# Patient Record
Sex: Male | Born: 1975
Health system: Southern US, Community
[De-identification: ages and names within clinical notes are randomized; demographics above are authoritative.]

## PROBLEM LIST (undated history)

## (undated) ENCOUNTER — Ambulatory Visit: Admission: EM | Payer: BC Managed Care – PPO

## (undated) DIAGNOSIS — G4730 Sleep apnea, unspecified: Secondary | ICD-10-CM

## (undated) DIAGNOSIS — Z87728 Personal history of other specified (corrected) congenital malformations of nervous system and sense organs: Secondary | ICD-10-CM

## (undated) DIAGNOSIS — N529 Male erectile dysfunction, unspecified: Secondary | ICD-10-CM

## (undated) DIAGNOSIS — Z8709 Personal history of other diseases of the respiratory system: Secondary | ICD-10-CM

## (undated) DIAGNOSIS — G4733 Obstructive sleep apnea (adult) (pediatric): Secondary | ICD-10-CM

## (undated) DIAGNOSIS — K219 Gastro-esophageal reflux disease without esophagitis: Secondary | ICD-10-CM

## (undated) DIAGNOSIS — M1711 Unilateral primary osteoarthritis, right knee: Secondary | ICD-10-CM

## (undated) DIAGNOSIS — M419 Scoliosis, unspecified: Secondary | ICD-10-CM

## (undated) DIAGNOSIS — Z9889 Other specified postprocedural states: Secondary | ICD-10-CM

## (undated) HISTORY — DX: Personal history of other diseases of the respiratory system: Z87.09

## (undated) HISTORY — DX: Obstructive sleep apnea (adult) (pediatric): G47.33

## (undated) HISTORY — DX: Sleep apnea, unspecified: G47.30

## (undated) HISTORY — PX: OTHER SURGICAL HISTORY: SHX169

## (undated) HISTORY — DX: Male erectile dysfunction, unspecified: N52.9

## (undated) HISTORY — DX: Other specified postprocedural states: Z98.890

## (undated) HISTORY — DX: Unilateral primary osteoarthritis, right knee: M17.11

## (undated) HISTORY — DX: Gastro-esophageal reflux disease without esophagitis: K21.9

---

## 2005-08-02 DIAGNOSIS — G4733 Obstructive sleep apnea (adult) (pediatric): Secondary | ICD-10-CM

## 2005-08-02 HISTORY — DX: Obstructive sleep apnea (adult) (pediatric): G47.33

## 2006-05-04 ENCOUNTER — Ambulatory Visit: Payer: Self-pay | Admitting: Family Medicine

## 2006-05-11 ENCOUNTER — Ambulatory Visit: Payer: Self-pay | Admitting: Family Medicine

## 2006-06-01 ENCOUNTER — Ambulatory Visit: Payer: Self-pay | Admitting: Pulmonary Disease

## 2006-06-29 ENCOUNTER — Ambulatory Visit (HOSPITAL_BASED_OUTPATIENT_CLINIC_OR_DEPARTMENT_OTHER): Admission: RE | Admit: 2006-06-29 | Discharge: 2006-06-29 | Payer: Self-pay | Admitting: Pulmonary Disease

## 2006-06-29 ENCOUNTER — Encounter: Payer: Self-pay | Admitting: Pulmonary Disease

## 2006-07-21 ENCOUNTER — Ambulatory Visit: Payer: Self-pay | Admitting: Pulmonary Disease

## 2006-08-15 ENCOUNTER — Ambulatory Visit: Payer: Self-pay | Admitting: Pulmonary Disease

## 2006-09-21 ENCOUNTER — Ambulatory Visit: Payer: Self-pay | Admitting: Pulmonary Disease

## 2007-03-01 ENCOUNTER — Ambulatory Visit: Payer: Self-pay | Admitting: Pulmonary Disease

## 2007-04-06 ENCOUNTER — Ambulatory Visit: Payer: Self-pay | Admitting: Pulmonary Disease

## 2007-08-25 ENCOUNTER — Ambulatory Visit: Payer: Self-pay | Admitting: Family Medicine

## 2007-08-25 DIAGNOSIS — F528 Other sexual dysfunction not due to a substance or known physiological condition: Secondary | ICD-10-CM

## 2007-08-25 DIAGNOSIS — N529 Male erectile dysfunction, unspecified: Secondary | ICD-10-CM | POA: Insufficient documentation

## 2008-05-22 ENCOUNTER — Ambulatory Visit: Payer: Self-pay | Admitting: Family Medicine

## 2008-05-27 ENCOUNTER — Ambulatory Visit: Payer: Self-pay | Admitting: Family Medicine

## 2008-05-27 LAB — CONVERTED CEMR LAB
Alkaline Phosphatase: 85 units/L (ref 39–117)
Basophils Absolute: 0 10*3/uL (ref 0.0–0.1)
Bilirubin, Direct: 0.1 mg/dL (ref 0.0–0.3)
Calcium: 9.4 mg/dL (ref 8.4–10.5)
Cholesterol: 176 mg/dL (ref 0–200)
Eosinophils Absolute: 0.2 10*3/uL (ref 0.0–0.7)
GFR calc Af Amer: 100 mL/min
GFR calc non Af Amer: 82 mL/min
HCT: 41.7 % (ref 39.0–52.0)
HDL: 32.8 mg/dL — ABNORMAL LOW (ref >39.0)
Hemoglobin: 14.6 g/dL (ref 13.0–17.0)
MCHC: 35.1 g/dL (ref 30.0–36.0)
MCV: 90.2 fL (ref 78.0–100.0)
Monocytes Absolute: 0.5 10*3/uL (ref 0.1–1.0)
Monocytes Relative: 8 % (ref 3.0–12.0)
Neutro Abs: 3.6 10*3/uL (ref 1.4–7.7)
Platelets: 249 10*3/uL (ref 150–400)
Potassium: 3.9 meq/L (ref 3.5–5.1)
RDW: 12.5 % (ref 11.5–14.6)
Sodium: 142 meq/L (ref 135–145)
Total Bilirubin: 0.8 mg/dL (ref 0.3–1.2)
Total CHOL/HDL Ratio: 5.4
Total Protein: 7.5 g/dL (ref 6.0–8.3)
Triglycerides: 85 mg/dL (ref 0–149)

## 2009-12-19 ENCOUNTER — Ambulatory Visit: Payer: Self-pay | Admitting: Family Medicine

## 2009-12-23 ENCOUNTER — Ambulatory Visit: Payer: Self-pay | Admitting: Family Medicine

## 2009-12-23 LAB — CONVERTED CEMR LAB
ALT: 23 units/L (ref 0–53)
AST: 19 units/L (ref 0–37)
Albumin: 4.2 g/dL (ref 3.5–5.2)
BUN: 17 mg/dL (ref 6–23)
Basophils Absolute: 0 10*3/uL (ref 0.0–0.1)
Chloride: 107 meq/L (ref 96–112)
Cholesterol: 168 mg/dL (ref 0–200)
Eosinophils Relative: 2.4 % (ref 0.0–5.0)
GFR calc non Af Amer: 86.82 mL/min (ref >60)
Glucose, Bld: 83 mg/dL (ref 70–99)
HCT: 40.7 % (ref 39.0–52.0)
Hemoglobin: 14.1 g/dL (ref 13.0–17.0)
Lymphs Abs: 1.8 10*3/uL (ref 0.7–4.0)
MCV: 89.5 fL (ref 78.0–100.0)
Monocytes Absolute: 0.5 10*3/uL (ref 0.1–1.0)
Monocytes Relative: 7.1 % (ref 3.0–12.0)
Neutro Abs: 4.1 10*3/uL (ref 1.4–7.7)
Nitrite: NEGATIVE
Platelets: 278 10*3/uL (ref 150.0–400.0)
Potassium: 4.1 meq/L (ref 3.5–5.1)
RDW: 13.3 % (ref 11.5–14.6)
Sodium: 140 meq/L (ref 135–145)
TSH: 0.87 microintl units/mL (ref 0.35–5.50)
Total Protein, Urine: NEGATIVE mg/dL
Urine Glucose: NEGATIVE mg/dL
pH: 7 (ref 5.0–8.0)

## 2010-04-16 ENCOUNTER — Ambulatory Visit: Payer: Self-pay | Admitting: Pulmonary Disease

## 2010-04-16 DIAGNOSIS — G4733 Obstructive sleep apnea (adult) (pediatric): Secondary | ICD-10-CM | POA: Insufficient documentation
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
## 2010-12-15 NOTE — Assessment & Plan Note (Signed)
 HEALTHCARE                             PULMONARY OFFICE NOTE   Travis Duran, Travis Duran                         MRN:          [id number]  DATE:04/06/2007                            DOB:          [birth year]    SUBJECTIVE:  [name] comes in today for followup of his sleep apnea,  which is being treated with CPAP.  He continues to have great difficulty  with keeping the mask on his face.  We have tried all different kinds of  mask, different ways of delivering the pressure via different machines,  as well as medications for desensitization, and also coping mechanisms.  Despite doing this, he states that he is completely unable to wear the  mask and would like to DC the CPAP.   EXAM:  GENERAL:  He is an overweight male in no acute distress.  Blood pressure 116/78, pulse 80, temperature 98.2, weighs 210 pounds, O2  saturation on room air is 97%.  There is no evidence of skin breakdown or pressure necrosis from the  CPAP mask.   IMPRESSION:  Moderate obstructive sleep apnea with poor compliance with  CPAP secondary to mask issues.  We have really tried everything possible  to try and get him adapted to the CPAP, and he truly feels that this is  not something that he is going to be able to use.  I have discussed with  him the possibility of nasoseptal reconstruction and UP3 as well as the  possibility of an oral appliance.  I have also re-explained to him that  weight loss would give him significant improvement.  At this point in  time, the patient wishes to try weight loss alone and would like to hold  off on any other intervention.   PLAN:  1. DC CPAP.  2. The patient will take the next 6 months to try and lose weight, but      will call me if he feels that he will not be able to accomplish      this on his own and we can try either upper airway surgery or      possible oral appliance.     Barbaraann Share, MD,FCCP  Electronically Signed    [facility]  DD: 04/06/2007  DT: 04/06/2007  Job #: [id number]   cc:   Tera Mater. Clent Ridges, MD
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
## 2010-12-18 NOTE — Procedures (Signed)
NAME:  Travis Duran, Travis Duran NO.:  [id number]   MEDICAL RECORD NO.:  [id number]          PATIENT TYPE:  OUT   LOCATION:  SLEEP CENTER                 FACILITY:  [facility]   PHYSICIAN:  Barbaraann Share, MD,FCCPDATE OF BIRTH:  12/31/75   DATE OF STUDY:  06/29/2006                            NOCTURNAL POLYSOMNOGRAM   INDICATION FOR STUDY:  Hypersomnia with sleep apnea.   EPWORTH SLEEPINESS SCORE:  Eleven.   SLEEP ARCHITECTURE:  The patient had a total sleep time of 353 minutes  with decreased __________ and never achieved slow wave sleep.  Sleep  onset latency was normal.  And REM onset was quite prolonged at 181  minutes.  Sleep efficiency was decreased at 87%.   RESPIRATORY DATA:  The patient was found to have 102 hypopneas, 82  obstructive apneas, and 2 central apneas for a respiratory disturbance  index of 32 events per hour.  The events occurred in all body positions  and there was moderate to loud snoring throughout.   OXYGEN DATA:  There was 02 desaturation as low as 85% with the patient's  obstructive events.   CARDIAC DATA:  The patient did exhibit cardiac accelerations and  decelerations with his obstructive events.  Otherwise there was no  clinically significant cardiac arrhythmias.   MOVEMENT-PARASOMNIA:  Small numbers of leg jerks without clinical sleep  disruption.   IMPRESSIONS-RECOMMENDATIONS:  1. Moderate obstructive sleep apnea/hypopnea syndrome with a      respiratory disturbance index of 32 events per hour and oxygen      desaturation as low as 85%.  Treatment for this degree of sleep      apnea should focus on weight loss as well as CPAP, although upper      airway surgery could be considered if the anatomy was appropriate.  2. Cardiac accelerations and decelerations associated with the      patient's obstructive events.      Barbaraann Share, MD,FCCP  Diplomate, American Board of Sleep  Medicine     [facility]  D:  07/15/2006 16:16:20  T:   07/15/2006 23:09:17  Job:  [id number]

## 2010-12-18 NOTE — Assessment & Plan Note (Signed)
[facility]                               PULMONARY OFFICE NOTE   AZEEZ, DUNKER                         MRN:          [id number]  DATE:06/01/2006                            DOB:          01/18/1976    HISTORY OF PRESENT ILLNESS:  The patient is a very pleasant 35 year old  white male whom I have been asked to see for possible sleep apnea.  The  patient states that he has been told by his wife that he has loud snoring as  well as pauses in his breathing during sleep.  The patient typically goes to  bed between 9:00 and 10:00 and gets up at 5:30 to start his day.  He is not  rested upon arousal.  The patient states that he wakes up at least 2-3 times  a night for unknown reasons and denies any choking or snoring arousals.  It  usually takes him 10-15 minutes to get back to sleep.  His wife has not  complained about kicking.  During the day the patient states that he has  significant sleep pressure with periods of inactivity and he will often have  to get up from his desk and walk around in order to regain his alertness.  He has noticed decreased deficiency in concentration.  He has no difficulty  watching movies or TV at night but does have some sleep pressure with  driving long distances.  Of note, the patient's weight is up 5-10 pounds  over the last few years.   PAST MEDICAL HISTORY:  Totally unremarkable except for knee surgery in the  past.   MEDICATIONS:  The patient takes no medications.   ALLERGIES:  No known drug allergies.   SOCIAL HISTORY:  He is married, does not have children.  He does not smoke.   FAMILY HISTORY:  Remarkable for his father having emphysema as well as  hypertension.   REVIEW OF SYSTEMS:  As per history of present illness.  Also, see patient  intake form documented in the chart.   PHYSICAL EXAM:  IN GENERAL:  He is an overweight white male in no acute  distress.  VITAL SIGNS:  Blood pressure 128/86.  Pulse  55.  Temperature 98.5.  Weight  201 pounds.  Height 5 feet 7 inches tall.  O2 saturation on room air is 98%.  HEENT:  Pupils equally round and reactive to light and accommodation.  Extraocular muscles are intact.  Nose shows mild septal deviation to the  left with turbinate hypertrophy.  Oropharynx does show significant  elongation of the soft palate and uvula with some side wall narrowing.  NECK:  Supple without JVD or lymphadenopathy.  There is n palpable  thyromegaly.  CHEST:  Totally clear.  CARDIAC:  Exam reveals regular rate and rhythm.  No murmurs, rubs, or  gallops.  ABDOMEN:  Soft, nontender.  Good bowel sounds.  GENITAL EXAM/RECTAL EXAM/BREAST EXAM:  Not done and not indicated.  LOWER EXTREMITIES:  Without edema.  Good pulses distally.  There is no calf  tenderness.  NEUROLOGICALLY:  Alert  and oriented with no obvious deficits.   IMPRESSION:  Probable obstructive sleep apnea.  The patient is overweight,  has abnormal upper airway anatomy, and has a history that is very suspicious  for obstructive sleep apnea.  I had a long conversation with him about this  and the cardiovascular effects long term.  At this point in time I think he  needs to have nocturnal polysomnography.   PLAN:  1. Work on weight loss.  2. Scheduled for MPSG.  3. The patient will follow up after the above.    ______________________________  Barbaraann Share, MD,FCCP    [facility]  DD: 06/01/2006  DT: 06/01/2006  Job #: [id number]   cc:   Jeannett Senior A. Clent Ridges, MD

## 2012-09-02 HISTORY — PX: REFRACTIVE SURGERY: SHX103

## 2012-10-09 ENCOUNTER — Encounter: Payer: Self-pay | Admitting: Family Medicine

## 2012-10-09 ENCOUNTER — Ambulatory Visit (INDEPENDENT_AMBULATORY_CARE_PROVIDER_SITE_OTHER): Payer: BC Managed Care – PPO | Admitting: Family Medicine

## 2012-10-09 VITALS — BP 104/78 | HR 77 | Temp 98.7°F | Wt 199.0 lb

## 2012-10-09 DIAGNOSIS — J209 Acute bronchitis, unspecified: Secondary | ICD-10-CM

## 2012-10-09 MED ORDER — AZITHROMYCIN 250 MG PO TABS: ORAL_TABLET | ORAL | Status: DC

## 2012-10-09 MED ORDER — HYDROCODONE-HOMATROPINE 5-1.5 MG/5ML PO SYRP: 5.0000 mL | ORAL_SOLUTION | ORAL | Status: DC | PRN

## 2012-10-09 NOTE — Progress Notes (Signed)
  Subjective:    Patient ID: Travis Duran, male    DOB: [birth year], 37 y.o.   MRN: [id number]  HPI Here for 5 days of a dry cough and a ST. Some body aches at first but not now. No fever. Tried Delsym with no relief.   Review of Systems  Constitutional: Negative.   HENT: Negative.   Eyes: Negative.   Respiratory: Positive for cough.        Objective:   Physical Exam  Constitutional: He appears well-developed and well-nourished.  HENT:  Right Ear: External ear normal.  Left Ear: External ear normal.  Nose: Nose normal.  Mouth/Throat: Oropharynx is clear and moist.  Eyes: Conjunctivae are normal.  Pulmonary/Chest: Effort normal and breath sounds normal.  Lymphadenopathy:    He has no cervical adenopathy.          Assessment & Plan:  Recheck prn

## 2013-10-29 ENCOUNTER — Ambulatory Visit (INDEPENDENT_AMBULATORY_CARE_PROVIDER_SITE_OTHER): Payer: BC Managed Care – PPO | Admitting: Family Medicine

## 2013-10-29 ENCOUNTER — Encounter: Payer: Self-pay | Admitting: Family Medicine

## 2013-10-29 VITALS — BP 110/80 | HR 85 | Temp 99.1°F | Ht 69.0 in | Wt 199.0 lb

## 2013-10-29 DIAGNOSIS — B9789 Other viral agents as the cause of diseases classified elsewhere: Secondary | ICD-10-CM

## 2013-10-29 DIAGNOSIS — B349 Viral infection, unspecified: Secondary | ICD-10-CM

## 2013-10-29 LAB — POCT RAPID STREP A (OFFICE): Rapid Strep A Screen: NEGATIVE

## 2013-10-29 NOTE — Progress Notes (Signed)
   Subjective:    Patient ID: Donnella Sham, male    DOB: 03-25-1976, 38 y.o.   MRN: [id number]  HPI Here for 3 days of fever, swollen neck nodes, and extreme fatigue. Some dry coughing but not much. No ST. Drinking fluids and taking Advil.    Review of Systems  Constitutional: Positive for fever and fatigue.  HENT: Negative.   Eyes: Negative.   Respiratory: Positive for cough. Negative for chest tightness, shortness of breath and wheezing.   Gastrointestinal: Negative.        Objective:   Physical Exam  Constitutional: He appears well-developed and well-nourished. No distress.  HENT:  Right Ear: External ear normal.  Left Ear: External ear normal.  Nose: Nose normal.  Mouth/Throat: Oropharynx is clear and moist.  Eyes: Conjunctivae are normal.  Neck: Neck supple. No thyromegaly present.  Shotty tender AC nodes   Pulmonary/Chest: Effort normal and breath sounds normal.  Abdominal: Soft. Bowel sounds are normal. He exhibits no distension and no mass. There is no tenderness. There is no rebound and no guarding.          Assessment & Plan:  He has a viral illness of some sort. We will get labs to look for mononucleosis. Rest, drink fluids, use Advil prn .

## 2013-10-29 NOTE — Progress Notes (Signed)
Pre visit review using our clinic review tool, if applicable. No additional management support is needed unless otherwise documented below in the visit note. 

## 2013-10-30 LAB — EPSTEIN-BARR VIRUS VCA, IGM: EBV VCA IgM: 26.7 U/mL (ref <36.0)

## 2013-10-30 LAB — EPSTEIN-BARR VIRUS VCA, IGG: EBV VCA IgG: 197 U/mL — ABNORMAL HIGH (ref <18.0)

## 2014-04-29 ENCOUNTER — Ambulatory Visit (INDEPENDENT_AMBULATORY_CARE_PROVIDER_SITE_OTHER): Payer: BC Managed Care – PPO | Admitting: Family Medicine

## 2014-04-29 ENCOUNTER — Encounter: Payer: Self-pay | Admitting: Family Medicine

## 2014-04-29 VITALS — BP 113/69 | HR 77 | Temp 100.3°F | Ht 69.0 in | Wt 199.0 lb

## 2014-04-29 DIAGNOSIS — J209 Acute bronchitis, unspecified: Secondary | ICD-10-CM

## 2014-04-29 MED ORDER — HYDROCODONE-HOMATROPINE 5-1.5 MG/5ML PO SYRP: 5.0000 mL | ORAL_SOLUTION | ORAL | Status: DC | PRN

## 2014-04-29 MED ORDER — AZITHROMYCIN 250 MG PO TABS: ORAL_TABLET | ORAL | Status: DC

## 2014-04-29 NOTE — Progress Notes (Signed)
Pre visit review using our clinic review tool, if applicable. No additional management support is needed unless otherwise documented below in the visit note. 

## 2014-04-29 NOTE — Progress Notes (Signed)
   Subjective:    Patient ID: Travis Duran, male    DOB: [birth year], 38 y.o.   MRN: [id number]  HPI Here for 5 days of fevers, ST, and a dry cough. Using Nyquil.    Review of Systems  Constitutional: Positive for fever.  HENT: Positive for congestion and postnasal drip. Negative for sinus pressure.   Respiratory: Positive for cough.        Objective:   Physical Exam  Constitutional: He appears well-developed and well-nourished.  HENT:  Right Ear: External ear normal.  Left Ear: External ear normal.  Nose: Nose normal.  Mouth/Throat: Oropharynx is clear and moist.  Eyes: Conjunctivae are normal.  Pulmonary/Chest: Effort normal. No respiratory distress. He has no wheezes. He has no rales.  Scattered rhonchi   Lymphadenopathy:    He has no cervical adenopathy.          Assessment & Plan:  Written out of work today and tomorrow

## 2015-12-05 ENCOUNTER — Other Ambulatory Visit (INDEPENDENT_AMBULATORY_CARE_PROVIDER_SITE_OTHER): Payer: BC Managed Care – PPO

## 2015-12-05 DIAGNOSIS — Z0000 Encounter for general adult medical examination without abnormal findings: Secondary | ICD-10-CM

## 2015-12-05 LAB — BASIC METABOLIC PANEL
BUN: 16 mg/dL (ref 6–23)
CALCIUM: 9.8 mg/dL (ref 8.4–10.5)
CO2: 28 mEq/L (ref 19–32)
Chloride: 104 mEq/L (ref 96–112)
Creatinine, Ser: 1.16 mg/dL (ref 0.40–1.50)
GFR: 74.08 mL/min (ref >60.00)
GLUCOSE: 97 mg/dL (ref 70–99)
Potassium: 3.9 mEq/L (ref 3.5–5.1)
Sodium: 139 mEq/L (ref 135–145)

## 2015-12-05 LAB — HEPATIC FUNCTION PANEL
ALK PHOS: 84 U/L (ref 39–117)
ALT: 17 U/L (ref 0–53)
AST: 15 U/L (ref 0–37)
Albumin: 4.6 g/dL (ref 3.5–5.2)
Bilirubin, Direct: 0.1 mg/dL (ref 0.0–0.3)
Total Bilirubin: 0.5 mg/dL (ref 0.2–1.2)
Total Protein: 7.4 g/dL (ref 6.0–8.3)

## 2015-12-05 LAB — POC URINALSYSI DIPSTICK (AUTOMATED)
Bilirubin, UA: NEGATIVE
GLUCOSE UA: NEGATIVE
KETONES UA: NEGATIVE
Leukocytes, UA: NEGATIVE
NITRITE UA: NEGATIVE
PROTEIN UA: NEGATIVE
SPEC GRAV UA: 1.025
UROBILINOGEN UA: 0.2
pH, UA: 6

## 2015-12-05 LAB — CBC WITH DIFFERENTIAL/PLATELET
BASOS ABS: 0 10*3/uL (ref 0.0–0.1)
Basophils Relative: 0.4 % (ref 0.0–3.0)
EOS ABS: 0.2 10*3/uL (ref 0.0–0.7)
Eosinophils Relative: 2.9 % (ref 0.0–5.0)
HCT: 42.6 % (ref 39.0–52.0)
Hemoglobin: 14.5 g/dL (ref 13.0–17.0)
LYMPHS ABS: 2.3 10*3/uL (ref 0.7–4.0)
Lymphocytes Relative: 32.6 % (ref 12.0–46.0)
MCHC: 34.1 g/dL (ref 30.0–36.0)
MCV: 88.4 fl (ref 78.0–100.0)
Monocytes Absolute: 0.5 10*3/uL (ref 0.1–1.0)
Monocytes Relative: 6.8 % (ref 3.0–12.0)
NEUTROS PCT: 57.3 % (ref 43.0–77.0)
Neutro Abs: 4 10*3/uL (ref 1.4–7.7)
Platelets: 253 10*3/uL (ref 150.0–400.0)
RBC: 4.82 Mil/uL (ref 4.22–5.81)
RDW: 13.1 % (ref 11.5–15.5)
WBC: 6.9 10*3/uL (ref 4.0–10.5)

## 2015-12-05 LAB — LIPID PANEL
CHOLESTEROL: 170 mg/dL (ref 0–200)
HDL: 45.5 mg/dL (ref >39.00)
LDL CALC: 111 mg/dL — AB (ref 0–99)
NONHDL: 124.68
Total CHOL/HDL Ratio: 4
Triglycerides: 67 mg/dL (ref 0.0–149.0)
VLDL: 13.4 mg/dL (ref 0.0–40.0)

## 2015-12-05 LAB — TSH: TSH: 1.86 u[IU]/mL (ref 0.35–4.50)

## 2015-12-10 ENCOUNTER — Other Ambulatory Visit: Payer: BC Managed Care – PPO

## 2015-12-16 ENCOUNTER — Encounter: Payer: Self-pay | Admitting: Family Medicine

## 2015-12-16 ENCOUNTER — Ambulatory Visit (INDEPENDENT_AMBULATORY_CARE_PROVIDER_SITE_OTHER): Payer: BC Managed Care – PPO | Admitting: Family Medicine

## 2015-12-16 VITALS — BP 105/73 | HR 64 | Temp 98.7°F | Ht 69.0 in | Wt 208.0 lb

## 2015-12-16 DIAGNOSIS — Z23 Encounter for immunization: Secondary | ICD-10-CM | POA: Diagnosis not present

## 2015-12-16 DIAGNOSIS — Z0000 Encounter for general adult medical examination without abnormal findings: Secondary | ICD-10-CM

## 2015-12-16 MED ORDER — TADALAFIL 10 MG PO TABS: 10.0000 mg | ORAL_TABLET | Freq: Every day | ORAL | Status: DC | PRN

## 2015-12-16 NOTE — Progress Notes (Signed)
   Subjective:    Patient ID: Travis Duran, male    DOB: 1976-02-13, 40 y.o.   MRN: [id number]  HPI 40 yr old male for a well exam. He feels fine. He is working and going to graduate school.    Review of Systems  Constitutional: Negative.   HENT: Negative.   Eyes: Negative.   Respiratory: Negative.   Cardiovascular: Negative.   Gastrointestinal: Negative.   Genitourinary: Negative.   Musculoskeletal: Negative.   Skin: Negative.   Neurological: Negative.   Psychiatric/Behavioral: Negative.        Objective:   Physical Exam  Constitutional: He is oriented to person, place, and time. He appears well-developed and well-nourished. No distress.  HENT:  Head: Normocephalic and atraumatic.  Right Ear: External ear normal.  Left Ear: External ear normal.  Nose: Nose normal.  Mouth/Throat: Oropharynx is clear and moist. No oropharyngeal exudate.  Eyes: Conjunctivae and EOM are normal. Pupils are equal, round, and reactive to light. Right eye exhibits no discharge. Left eye exhibits no discharge. No scleral icterus.  Neck: Neck supple. No JVD present. No tracheal deviation present. No thyromegaly present.  Cardiovascular: Normal rate, regular rhythm, normal heart sounds and intact distal pulses.  Exam reveals no gallop and no friction rub.   No murmur heard. Pulmonary/Chest: Effort normal and breath sounds normal. No respiratory distress. He has no wheezes. He has no rales. He exhibits no tenderness.  Abdominal: Soft. Bowel sounds are normal. He exhibits no distension and no mass. There is no tenderness. There is no rebound and no guarding.  Genitourinary: Rectum normal, prostate normal and penis normal. Guaiac negative stool. No penile tenderness.  Musculoskeletal: Normal range of motion. He exhibits no edema or tenderness.  Lymphadenopathy:    He has no cervical adenopathy.  Neurological: He is alert and oriented to person, place, and time. He has normal reflexes. No cranial nerve  deficit. He exhibits normal muscle tone. Coordination normal.  Skin: Skin is warm and dry. No rash noted. He is not diaphoretic. No erythema. No pallor.  Psychiatric: He has a normal mood and affect. His behavior is normal. Judgment and thought content normal.          Assessment & Plan:  Well exam. We discussed diet and exercise.  Laurey Morale, MD

## 2015-12-16 NOTE — Progress Notes (Signed)
Pre visit review using our clinic review tool, if applicable. No additional management support is needed unless otherwise documented below in the visit note. 

## 2019-03-21 ENCOUNTER — Encounter: Payer: Self-pay | Admitting: Family Medicine

## 2019-04-06 ENCOUNTER — Encounter: Payer: Self-pay | Admitting: Family Medicine

## 2019-04-06 ENCOUNTER — Other Ambulatory Visit: Payer: Self-pay

## 2019-04-06 ENCOUNTER — Other Ambulatory Visit: Payer: Self-pay | Admitting: Family Medicine

## 2019-04-06 ENCOUNTER — Ambulatory Visit (INDEPENDENT_AMBULATORY_CARE_PROVIDER_SITE_OTHER): Payer: BC Managed Care – PPO | Admitting: Family Medicine

## 2019-04-06 VITALS — BP 130/70 | HR 73 | Temp 99.8°F | Wt 205.4 lb

## 2019-04-06 DIAGNOSIS — Z0000 Encounter for general adult medical examination without abnormal findings: Secondary | ICD-10-CM

## 2019-04-06 DIAGNOSIS — Z309 Encounter for contraceptive management, unspecified: Secondary | ICD-10-CM | POA: Diagnosis not present

## 2019-04-06 LAB — HEPATIC FUNCTION PANEL
ALT: 16 U/L (ref 0–53)
AST: 13 U/L (ref 0–37)
Albumin: 4.5 g/dL (ref 3.5–5.2)
Alkaline Phosphatase: 96 U/L (ref 39–117)
Bilirubin, Direct: 0.1 mg/dL (ref 0.0–0.3)
Total Bilirubin: 0.6 mg/dL (ref 0.2–1.2)
Total Protein: 7.3 g/dL (ref 6.0–8.3)

## 2019-04-06 LAB — BASIC METABOLIC PANEL
BUN: 14 mg/dL (ref 6–23)
CO2: 31 mEq/L (ref 19–32)
Calcium: 9.8 mg/dL (ref 8.4–10.5)
Chloride: 101 mEq/L (ref 96–112)
Creatinine, Ser: 1.22 mg/dL (ref 0.40–1.50)
GFR: 64.7 mL/min (ref >60.00)
Glucose, Bld: 79 mg/dL (ref 70–99)
Potassium: 4.2 mEq/L (ref 3.5–5.1)
Sodium: 139 mEq/L (ref 135–145)

## 2019-04-06 LAB — LIPID PANEL
Cholesterol: 170 mg/dL (ref 0–200)
HDL: 43.2 mg/dL (ref >39.00)
LDL Cholesterol: 115 mg/dL — ABNORMAL HIGH (ref 0–99)
NonHDL: 126.73
Total CHOL/HDL Ratio: 4
Triglycerides: 58 mg/dL (ref 0.0–149.0)
VLDL: 11.6 mg/dL (ref 0.0–40.0)

## 2019-04-06 LAB — POC URINALSYSI DIPSTICK (AUTOMATED)
Bilirubin, UA: NEGATIVE
Blood, UA: NEGATIVE
Glucose, UA: NEGATIVE
Ketones, UA: NEGATIVE
Leukocytes, UA: NEGATIVE
Nitrite, UA: NEGATIVE
Protein, UA: NEGATIVE
Spec Grav, UA: 1.02 (ref 1.010–1.025)
Urobilinogen, UA: 0.2 E.U./dL
pH, UA: 6 (ref 5.0–8.0)

## 2019-04-06 LAB — CBC WITH DIFFERENTIAL/PLATELET
Basophils Absolute: 0 10*3/uL (ref 0.0–0.1)
Basophils Relative: 0.2 % (ref 0.0–3.0)
Eosinophils Absolute: 0.2 10*3/uL (ref 0.0–0.7)
Eosinophils Relative: 2.5 % (ref 0.0–5.0)
HCT: 43.2 % (ref 39.0–52.0)
Hemoglobin: 14.7 g/dL (ref 13.0–17.0)
Lymphocytes Relative: 33.1 % (ref 12.0–46.0)
Lymphs Abs: 2.2 10*3/uL (ref 0.7–4.0)
MCHC: 34 g/dL (ref 30.0–36.0)
MCV: 90.7 fl (ref 78.0–100.0)
Monocytes Absolute: 0.5 10*3/uL (ref 0.1–1.0)
Monocytes Relative: 7.3 % (ref 3.0–12.0)
Neutro Abs: 3.8 10*3/uL (ref 1.4–7.7)
Neutrophils Relative %: 56.9 % (ref 43.0–77.0)
Platelets: 278 10*3/uL (ref 150.0–400.0)
RBC: 4.76 Mil/uL (ref 4.22–5.81)
RDW: 13.7 % (ref 11.5–15.5)
WBC: 6.7 10*3/uL (ref 4.0–10.5)

## 2019-04-06 LAB — TSH: TSH: 2.17 u[IU]/mL (ref 0.35–4.50)

## 2019-04-06 MED ORDER — TADALAFIL 10 MG PO TABS: 10.0000 mg | ORAL_TABLET | Freq: Every day | ORAL | 11 refills | Status: DC | PRN

## 2019-04-06 NOTE — Progress Notes (Signed)
   Subjective:    Patient ID: Travis Duran, male    DOB: August 30, 1975, 43 y.o.   MRN: [id number]  HPI Here for a well exam. He feels fine. He is interested in getting a vasectomy.      Review of Systems  Constitutional: Negative.   HENT: Negative.   Eyes: Negative.   Respiratory: Negative.   Cardiovascular: Negative.   Gastrointestinal: Negative.   Genitourinary: Negative.   Musculoskeletal: Negative.   Skin: Negative.   Neurological: Negative.   Psychiatric/Behavioral: Negative.        Objective:   Physical Exam Constitutional:      General: He is not in acute distress.    Appearance: He is well-developed. He is not diaphoretic.  HENT:     Head: Normocephalic and atraumatic.     Right Ear: External ear normal.     Left Ear: External ear normal.     Nose: Nose normal.     Mouth/Throat:     Pharynx: No oropharyngeal exudate.  Eyes:     General: No scleral icterus.       Right eye: No discharge.        Left eye: No discharge.     Conjunctiva/sclera: Conjunctivae normal.     Pupils: Pupils are equal, round, and reactive to light.  Neck:     Musculoskeletal: Neck supple.     Thyroid: No thyromegaly.     Vascular: No JVD.     Trachea: No tracheal deviation.  Cardiovascular:     Rate and Rhythm: Normal rate and regular rhythm.     Heart sounds: Normal heart sounds. No murmur. No friction rub. No gallop.   Pulmonary:     Effort: Pulmonary effort is normal. No respiratory distress.     Breath sounds: Normal breath sounds. No wheezing or rales.  Chest:     Chest wall: No tenderness.  Abdominal:     General: Bowel sounds are normal. There is no distension.     Palpations: Abdomen is soft. There is no mass.     Tenderness: There is no abdominal tenderness. There is no guarding or rebound.  Genitourinary:    Penis: Normal. No tenderness.      Scrotum/Testes: Normal.  Musculoskeletal: Normal range of motion.        General: No tenderness.  Lymphadenopathy:   Cervical: No cervical adenopathy.  Skin:    General: Skin is warm and dry.     Coloration: Skin is not pale.     Findings: No erythema or rash.  Neurological:     Mental Status: He is alert and oriented to person, place, and time.     Cranial Nerves: No cranial nerve deficit.     Motor: No abnormal muscle tone.     Coordination: Coordination normal.     Deep Tendon Reflexes: Reflexes are normal and symmetric. Reflexes normal.  Psychiatric:        Behavior: Behavior normal.        Thought Content: Thought content normal.        Judgment: Judgment normal.           Assessment & Plan:  Well exam. We discussed diet and exercise. Get fasting labs. Refer to Urology. Alysia Penna, MD

## 2019-04-10 MED ORDER — SILDENAFIL CITRATE 20 MG PO TABS: ORAL_TABLET | ORAL | 11 refills | Status: DC

## 2019-04-10 NOTE — Telephone Encounter (Signed)
Rx has been sent in. 

## 2019-04-10 NOTE — Telephone Encounter (Signed)
Stop Vardenafil and call in Sildenafil 20 mg to use prn, #10 with 11 rf

## 2020-03-24 ENCOUNTER — Encounter: Payer: Self-pay | Admitting: Family Medicine

## 2020-03-24 ENCOUNTER — Telehealth (INDEPENDENT_AMBULATORY_CARE_PROVIDER_SITE_OTHER): Payer: BC Managed Care – PPO | Admitting: Family Medicine

## 2020-03-24 VITALS — Ht 69.0 in

## 2020-03-24 DIAGNOSIS — R05 Cough: Secondary | ICD-10-CM | POA: Diagnosis not present

## 2020-03-24 NOTE — Progress Notes (Signed)
   Subjective:    Patient ID: Travis Duran, male    DOB: 06-03-1976, 44 y.o.   MRN: [id number]  HPI Here for 2 weeks of a dry cough. No other symptoms, no fever or headache or ST or SOB or body aches or NVD. He has been taking Zyrtec and Nyquil. He has been fully vaccinated against the Covid virus.  Virtual Visit via Telephone Note  I connected with the patient on 03/24/20 at 11:15 AM EDT by telephone and verified that I am speaking with the correct person using two identifiers.   I discussed the limitations, risks, security and privacy concerns of performing an evaluation and management service by telephone and the availability of in person appointments. I also discussed with the patient that there may be a patient responsible charge related to this service. The patient expressed understanding and agreed to proceed.  Location patient: home Location provider: work or home office Participants present for the call: patient, provider Patient did not have a visit in the prior 7 days to address this/these issue(s).   History of Present Illness:    Observations/Objective: Patient sounds cheerful and well on the phone. I do not appreciate any SOB. Speech and thought processing are grossly intact. Patient reported vitals:  Assessment and Plan: Cough, either allergic or viral in nature. Stay on Zyrtec and try Delsym. Recheck prn.  Alysia Penna, MD   Follow Up Instructions:     [phone] [phone] 11-20 [phone] 21-30 I did not refer this patient for an OV in the next 24 hours for this/these issue(s).  I discussed the assessment and treatment plan with the patient. The patient was provided an opportunity to ask questions and all were answered. The patient agreed with the plan and demonstrated an understanding of the instructions.   The patient was advised to call back or seek an in-person evaluation if the symptoms worsen or if the condition fails to improve as anticipated.  I provided 13  minutes of non-face-to-face time during this encounter.   Alysia Penna, MD    Review of Systems     Objective:   Physical Exam        Assessment & Plan:

## 2020-03-31 ENCOUNTER — Other Ambulatory Visit: Payer: Self-pay

## 2020-03-31 ENCOUNTER — Encounter: Payer: Self-pay | Admitting: Family Medicine

## 2020-03-31 ENCOUNTER — Ambulatory Visit (INDEPENDENT_AMBULATORY_CARE_PROVIDER_SITE_OTHER): Payer: BC Managed Care – PPO | Admitting: Family Medicine

## 2020-03-31 VITALS — BP 120/70 | HR 108 | Temp 98.7°F | Wt 211.8 lb

## 2020-03-31 DIAGNOSIS — J90 Pleural effusion, not elsewhere classified: Secondary | ICD-10-CM | POA: Diagnosis not present

## 2020-03-31 DIAGNOSIS — R053 Chronic cough: Secondary | ICD-10-CM

## 2020-03-31 DIAGNOSIS — R05 Cough: Secondary | ICD-10-CM | POA: Diagnosis not present

## 2020-03-31 MED ORDER — HYDROCODONE-HOMATROPINE 5-1.5 MG/5ML PO SYRP: 5.0000 mL | ORAL_SOLUTION | Freq: Three times a day (TID) | ORAL | 0 refills | Status: DC | PRN

## 2020-03-31 MED ORDER — FLOVENT HFA 110 MCG/ACT IN AERO: 2.0000 | INHALATION_SPRAY | Freq: Two times a day (BID) | RESPIRATORY_TRACT | 0 refills | Status: DC

## 2020-03-31 MED ORDER — BENZONATATE 200 MG PO CAPS: 200.0000 mg | ORAL_CAPSULE | Freq: Two times a day (BID) | ORAL | 0 refills | Status: DC | PRN

## 2020-03-31 NOTE — Progress Notes (Signed)
   Subjective:    Patient ID: Travis Duran, male    DOB: 1975-12-01, 44 y.o.   MRN: [id number]  HPI He for a cough that started 3 weeks ago. He has a frequent dry, hacking cough that has not responded to Zyrtec or Delsym. No sinus congestion or PND or ST no chest pain or SOB or wheezing. No fever or body aches. No NVD.    Review of Systems  Constitutional: Negative.   HENT: Negative.   Eyes: Negative.   Respiratory: Positive for cough. Negative for chest tightness, shortness of breath, wheezing and stridor.   Cardiovascular: Negative.        Objective:   Physical Exam Constitutional:      Appearance: Normal appearance.     Comments: He coughs frequently   HENT:     Right Ear: Tympanic membrane, ear canal and external ear normal.     Left Ear: Tympanic membrane, ear canal and external ear normal.     Nose: Nose normal.     Mouth/Throat:     Pharynx: Oropharynx is clear.  Eyes:     Conjunctiva/sclera: Conjunctivae normal.  Cardiovascular:     Rate and Rhythm: Normal rate and regular rhythm.     Pulses: Normal pulses.     Heart sounds: Normal heart sounds.  Pulmonary:     Effort: Pulmonary effort is normal. No respiratory distress.     Breath sounds: Normal breath sounds. No stridor. No wheezing, rhonchi or rales.  Lymphadenopathy:     Cervical: No cervical adenopathy.  Neurological:     Mental Status: He is alert.           Assessment & Plan:  Chronic cough, he will try Flovent BID, Benzonatate BID, and Hydromet as needed. Get a CXR today. Alysia Penna, MD

## 2020-04-01 ENCOUNTER — Telehealth: Payer: Self-pay | Admitting: Family Medicine

## 2020-04-01 MED ORDER — PROMETHAZINE-CODEINE 6.25-10 MG/5ML PO SYRP: 10.0000 mL | ORAL_SOLUTION | Freq: Four times a day (QID) | ORAL | 0 refills | Status: DC | PRN

## 2020-04-01 NOTE — Telephone Encounter (Signed)
Pt stated the cough syrup Hycodan is on back order and wondering if there is an alternative that can be called into his pharmacy? [facility] [place] [place] Phone:  [phone]  Fax:  [phone]      Pt is also wondering if it is possible for him to schedule an appt at the [facility] location for his chest xray due to him living far away and not wanting to wait hours to be seen for it?   Pt can be reached at [phone]

## 2020-04-01 NOTE — Telephone Encounter (Signed)
I called in another cough syrup called promethazine with codeine. As for the Xray, I am sure he could call [facility] at [phone] to schedule this

## 2020-04-03 ENCOUNTER — Ambulatory Visit
Admission: RE | Admit: 2020-04-03 | Discharge: 2020-04-03 | Disposition: A | Discharge status: Home / Self Care | Payer: BC Managed Care – PPO | Source: Ambulatory Visit | Attending: Family Medicine | Admitting: Family Medicine

## 2020-04-03 DIAGNOSIS — R05 Cough: Secondary | ICD-10-CM | POA: Diagnosis present

## 2020-04-03 DIAGNOSIS — R053 Chronic cough: Secondary | ICD-10-CM

## 2020-04-04 ENCOUNTER — Other Ambulatory Visit: Payer: Self-pay

## 2020-04-04 ENCOUNTER — Telehealth: Payer: Self-pay | Admitting: Family Medicine

## 2020-04-04 ENCOUNTER — Other Ambulatory Visit: Payer: Self-pay | Admitting: Family Medicine

## 2020-04-04 MED ORDER — AMOXICILLIN-POT CLAVULANATE 875-125 MG PO TABS: 1.0000 | ORAL_TABLET | Freq: Two times a day (BID) | ORAL | 0 refills | Status: DC

## 2020-04-04 NOTE — Telephone Encounter (Signed)
Patient called in to requested that his antibiotics to be sent to  [facility], [place] Phone:  [phone]  Fax:  [phone]     Instead of the one in [place]

## 2020-04-04 NOTE — Telephone Encounter (Signed)
Pt called to say the pharmacy cannot fill his prescription   amoxicillin-clavulanate (AUGMENTIN) 875-125 MG tablet   Until 09/11    [facility] [address] [facility] [place] [phone]   Pt also is asking for his CT scan to be done at [facility]Marland Kitchen

## 2020-04-04 NOTE — Telephone Encounter (Signed)
Spoke with the patient. He stated [facility] [address] to pick this up. He was also made aware that we are working on switching his referral to [facility]

## 2020-04-04 NOTE — Addendum Note (Signed)
Addended by: Alysia Penna A on: 04/04/2020 09:00 AM   Modules accepted: Orders

## 2020-04-04 NOTE — Telephone Encounter (Signed)
Rx has been sent to the requested pharmacy. The patient has been made aware. Nothing further.

## 2020-04-04 NOTE — Telephone Encounter (Signed)
Hey can you transfer his chest CT order to [facility], or do I have to start all over again?

## 2020-04-04 NOTE — Telephone Encounter (Signed)
[facility] was [facility] [address]. The patient called back and asked that it be sent to another pharmacy. [facility] had already filled the prescription that is why it was "too soon" to be filled.   atc the pharmacy and cancel the prescription at [facility] so that this could be filled. Was on hold for 10 minutes and no one answered the phone. Will continue to try and reach them.  Please advise on the CT

## 2020-04-15 ENCOUNTER — Encounter: Payer: Self-pay | Admitting: Family Medicine

## 2020-04-16 NOTE — Telephone Encounter (Signed)
At this point we should wait and see how he does in the next week or two. He is scheduled for a chest CT on 04-24-20.

## 2020-04-16 NOTE — Telephone Encounter (Signed)
Pt received a call from the pharmacy stating the amoxicillin was refilled and he was not aware he was suppose to be taking any more? He said the refilled date was 9/11    Pt can be reached at [phone]

## 2020-04-17 NOTE — Telephone Encounter (Signed)
Patient called to ask about his call to the office below. He says that he went to pick up the amoxicillin but hasn't taken it. I asked did he complete the course of the medication that was sent on 04/04/20, he says yes he took amoxicillin already. I advised I will call him back after checking with [facility]. I called [facility] and spoke to [place] about the refill of Amoxicillin on 04/12/20. She says the patient had one filled on [facility]/04/20 at another [facility], so the insurance would not allow them to fill until 9/11, so that's what they did and the patient came to pick it up. I called the patient and advised of the mixup and advised not to take the Amoxicillin he picked up yesterday because it's not needed, he verbalized understanding.

## 2020-04-24 ENCOUNTER — Telehealth: Payer: Self-pay | Admitting: *Deleted

## 2020-04-24 ENCOUNTER — Inpatient Hospital Stay (HOSPITAL_COMMUNITY)
Admission: EM | Admit: 2020-04-24 | Discharge: 2020-04-28 | DRG: 821 | Disposition: A | Discharge status: Home / Self Care | Payer: BC Managed Care – PPO | Attending: Internal Medicine | Admitting: Internal Medicine

## 2020-04-24 ENCOUNTER — Other Ambulatory Visit: Payer: Self-pay

## 2020-04-24 ENCOUNTER — Ambulatory Visit
Admission: RE | Admit: 2020-04-24 | Discharge: 2020-04-24 | Disposition: A | Discharge status: Home / Self Care | Payer: BC Managed Care – PPO | Source: Ambulatory Visit | Attending: Family Medicine | Admitting: Family Medicine

## 2020-04-24 DIAGNOSIS — J910 Malignant pleural effusion: Secondary | ICD-10-CM | POA: Diagnosis present

## 2020-04-24 DIAGNOSIS — R630 Anorexia: Secondary | ICD-10-CM | POA: Diagnosis present

## 2020-04-24 DIAGNOSIS — C8520 Mediastinal (thymic) large B-cell lymphoma, unspecified site: Principal | ICD-10-CM | POA: Diagnosis present

## 2020-04-24 DIAGNOSIS — R918 Other nonspecific abnormal finding of lung field: Secondary | ICD-10-CM

## 2020-04-24 DIAGNOSIS — G4700 Insomnia, unspecified: Secondary | ICD-10-CM | POA: Diagnosis present

## 2020-04-24 DIAGNOSIS — Z8249 Family history of ischemic heart disease and other diseases of the circulatory system: Secondary | ICD-10-CM

## 2020-04-24 DIAGNOSIS — Z0189 Encounter for other specified special examinations: Secondary | ICD-10-CM | POA: Diagnosis not present

## 2020-04-24 DIAGNOSIS — Z9889 Other specified postprocedural states: Secondary | ICD-10-CM

## 2020-04-24 DIAGNOSIS — J90 Pleural effusion, not elsewhere classified: Secondary | ICD-10-CM

## 2020-04-24 DIAGNOSIS — J9859 Other diseases of mediastinum, not elsewhere classified: Secondary | ICD-10-CM

## 2020-04-24 DIAGNOSIS — Z825 Family history of asthma and other chronic lower respiratory diseases: Secondary | ICD-10-CM

## 2020-04-24 DIAGNOSIS — Z8709 Personal history of other diseases of the respiratory system: Secondary | ICD-10-CM

## 2020-04-24 DIAGNOSIS — N529 Male erectile dysfunction, unspecified: Secondary | ICD-10-CM | POA: Diagnosis present

## 2020-04-24 DIAGNOSIS — Z6831 Body mass index (BMI) 31.0-31.9, adult: Secondary | ICD-10-CM | POA: Diagnosis not present

## 2020-04-24 DIAGNOSIS — Z23 Encounter for immunization: Secondary | ICD-10-CM | POA: Diagnosis not present

## 2020-04-24 DIAGNOSIS — R0602 Shortness of breath: Secondary | ICD-10-CM

## 2020-04-24 DIAGNOSIS — Z79899 Other long term (current) drug therapy: Secondary | ICD-10-CM

## 2020-04-24 DIAGNOSIS — G4733 Obstructive sleep apnea (adult) (pediatric): Secondary | ICD-10-CM | POA: Diagnosis present

## 2020-04-24 DIAGNOSIS — R053 Chronic cough: Secondary | ICD-10-CM

## 2020-04-24 DIAGNOSIS — Z20822 Contact with and (suspected) exposure to covid-19: Secondary | ICD-10-CM | POA: Diagnosis present

## 2020-04-24 DIAGNOSIS — J9811 Atelectasis: Secondary | ICD-10-CM | POA: Diagnosis present

## 2020-04-24 LAB — CBC WITH DIFFERENTIAL/PLATELET
Abs Immature Granulocytes: 0.04 10*3/uL (ref 0.00–0.07)
Basophils Absolute: 0 10*3/uL (ref 0.0–0.1)
Basophils Relative: 0 %
Eosinophils Absolute: 0.2 10*3/uL (ref 0.0–0.5)
Eosinophils Relative: 2 %
HCT: 41.5 % (ref 39.0–52.0)
Hemoglobin: 13.6 g/dL (ref 13.0–17.0)
Immature Granulocytes: 0 %
Lymphocytes Relative: 9 %
Lymphs Abs: 0.9 10*3/uL (ref 0.7–4.0)
MCH: 29 pg (ref 26.0–34.0)
MCHC: 32.8 g/dL (ref 30.0–36.0)
MCV: 88.5 fL (ref 80.0–100.0)
Monocytes Absolute: 0.9 10*3/uL (ref 0.1–1.0)
Monocytes Relative: 10 %
Neutro Abs: 7.6 10*3/uL (ref 1.7–7.7)
Neutrophils Relative %: 79 %
Platelets: 461 10*3/uL — ABNORMAL HIGH (ref 150–400)
RBC: 4.69 MIL/uL (ref 4.22–5.81)
RDW: 13.4 % (ref 11.5–15.5)
WBC: 9.6 10*3/uL (ref 4.0–10.5)
nRBC: 0 % (ref 0.0–0.2)

## 2020-04-24 LAB — BASIC METABOLIC PANEL
Anion gap: 11 (ref 5–15)
BUN: 10 mg/dL (ref 6–20)
CO2: 23 mmol/L (ref 22–32)
Calcium: 9.1 mg/dL (ref 8.9–10.3)
Chloride: 104 mmol/L (ref 98–111)
Creatinine, Ser: 1.02 mg/dL (ref 0.61–1.24)
GFR calc Af Amer: >60 mL/min (ref >60)
GFR calc non Af Amer: >60 mL/min (ref >60)
Glucose, Bld: 109 mg/dL — ABNORMAL HIGH (ref 70–99)
Potassium: 3.8 mmol/L (ref 3.5–5.1)
Sodium: 138 mmol/L (ref 135–145)

## 2020-04-24 LAB — RESPIRATORY PANEL BY RT PCR (FLU A&B, COVID)
Influenza A by PCR: NEGATIVE
Influenza B by PCR: NEGATIVE
SARS Coronavirus 2 by RT PCR: NEGATIVE

## 2020-04-24 LAB — HIV ANTIBODY (ROUTINE TESTING W REFLEX): HIV Screen 4th Generation wRfx: NONREACTIVE

## 2020-04-24 MED ORDER — ONDANSETRON HCL 4 MG/2ML IJ SOLN: 4.0000 mg | Freq: Four times a day (QID) | INTRAMUSCULAR | Status: DC | PRN

## 2020-04-24 MED ORDER — HEPARIN SODIUM (PORCINE) 5000 UNIT/ML IJ SOLN
5000.0000 [IU] | Freq: Three times a day (TID) | INTRAMUSCULAR | Status: DC
  Administered 2020-04-24 – 2020-04-28 (×7): 5000 [IU] via SUBCUTANEOUS
  Filled 2020-04-24 (×7): qty 1

## 2020-04-24 MED ORDER — ACETAMINOPHEN 650 MG RE SUPP: 650.0000 mg | Freq: Four times a day (QID) | RECTAL | Status: DC | PRN

## 2020-04-24 MED ORDER — ONDANSETRON HCL 4 MG PO TABS: 4.0000 mg | ORAL_TABLET | Freq: Four times a day (QID) | ORAL | Status: DC | PRN

## 2020-04-24 MED ORDER — SODIUM CHLORIDE 0.9 % IV SOLN: INTRAVENOUS | Status: DC

## 2020-04-24 MED ORDER — GUAIFENESIN 100 MG/5ML PO SOLN
10.0000 mL | ORAL | Status: DC | PRN
  Administered 2020-04-25 (×2): 200 mg via ORAL
  Filled 2020-04-24 (×5): qty 10

## 2020-04-24 MED ORDER — ACETAMINOPHEN 325 MG PO TABS: 650.0000 mg | ORAL_TABLET | Freq: Four times a day (QID) | ORAL | Status: DC | PRN

## 2020-04-24 MED ORDER — INFLUENZA VAC SPLIT QUAD 0.5 ML IM SUSY
0.5000 mL | PREFILLED_SYRINGE | INTRAMUSCULAR | Status: AC
  Administered 2020-04-27: 0.5 mL via INTRAMUSCULAR
  Filled 2020-04-24: qty 0.5

## 2020-04-24 MED ORDER — IOPAMIDOL (ISOVUE-300) INJECTION 61%
75.0000 mL | Freq: Once | INTRAVENOUS | Status: AC | PRN
  Administered 2020-04-24: 75 mL via INTRAVENOUS

## 2020-04-24 MED ORDER — TRAZODONE HCL 50 MG PO TABS
50.0000 mg | ORAL_TABLET | Freq: Once | ORAL | Status: AC
  Administered 2020-04-25: 50 mg via ORAL
  Filled 2020-04-24: qty 1
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
## 2020-04-24 NOTE — ED Provider Notes (Signed)
Medical screening examination/treatment/procedure(s) were conducted as a shared visit with non-physician practitioner(s) and myself.  I personally evaluated the patient during the encounter.  EKG Interpretation  Date/Time:  Thursday April 24 2020 16:17:49 EDT Ventricular Rate:  112 PR Interval:    QRS Duration: 91 QT Interval:  321 QTC Calculation: 439 R Axis:   37 Text Interpretation: Sinus tachycardia Low voltage, extremity and precordial leads No old tracing to compare Confirmed by Lacretia Leigh [phone]) on 04/24/2020 4:64:65 PM 44 year old male presents with cough and shortness of breath for the past several weeks. Had outpatient CT scan which showed a pleural effusion and a mass. Will admit to the hospital for further management   Lacretia Leigh, MD 04/24/20 1745
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
## 2020-04-24 NOTE — ED Notes (Signed)
Admitting at bedside 

## 2020-04-24 NOTE — ED Triage Notes (Signed)
Pt has had a cough for 6 weeks with Shortness of breath. A few weeks ago pt was diagnosed with pneumonia, and completed antibiotics that were prescribed. Was at imaging today and CT chest found mass and pleural effusion to the left lung. Imaging told pt to come here by EMS.

## 2020-04-24 NOTE — Telephone Encounter (Signed)
Received a call from Dr. 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 Per Dr. Polly Cobia patient is actively SOB. Informed Dr.Poff to send patient to nearest ED. He would like a Provider here to call patient and arrange admit at [facility].

## 2020-04-25 ENCOUNTER — Other Ambulatory Visit: Payer: Self-pay

## 2020-04-25 ENCOUNTER — Encounter (HOSPITAL_COMMUNITY)
Admission: EM | Disposition: A | Discharge status: Home / Self Care | Payer: Self-pay | Source: Home / Self Care | Attending: Family Medicine

## 2020-04-25 ENCOUNTER — Inpatient Hospital Stay (HOSPITAL_COMMUNITY): Payer: BC Managed Care – PPO

## 2020-04-25 ENCOUNTER — Inpatient Hospital Stay (HOSPITAL_COMMUNITY): Payer: BC Managed Care – PPO | Admitting: Certified Registered Nurse Anesthetist

## 2020-04-25 ENCOUNTER — Encounter (HOSPITAL_COMMUNITY): Payer: Self-pay | Admitting: Internal Medicine

## 2020-04-25 DIAGNOSIS — J90 Pleural effusion, not elsewhere classified: Secondary | ICD-10-CM

## 2020-04-25 DIAGNOSIS — R918 Other nonspecific abnormal finding of lung field: Secondary | ICD-10-CM

## 2020-04-25 DIAGNOSIS — Z8709 Personal history of other diseases of the respiratory system: Secondary | ICD-10-CM

## 2020-04-25 HISTORY — PX: VIDEO BRONCHOSCOPY WITH ENDOBRONCHIAL ULTRASOUND: SHX6177

## 2020-04-25 HISTORY — PX: THORACENTESIS: SHX235

## 2020-04-25 HISTORY — PX: FINE NEEDLE ASPIRATION: SHX5430

## 2020-04-25 HISTORY — PX: IR THORACENTESIS ASP PLEURAL SPACE W/IMG GUIDE: IMG5380

## 2020-04-25 LAB — COMPREHENSIVE METABOLIC PANEL
ALT: 31 U/L (ref 0–44)
AST: 23 U/L (ref 15–41)
Albumin: 2.9 g/dL — ABNORMAL LOW (ref 3.5–5.0)
Alkaline Phosphatase: 81 U/L (ref 38–126)
Anion gap: 10 (ref 5–15)
BUN: 10 mg/dL (ref 6–20)
CO2: 24 mmol/L (ref 22–32)
Calcium: 9.1 mg/dL (ref 8.9–10.3)
Chloride: 104 mmol/L (ref 98–111)
Creatinine, Ser: 1.19 mg/dL (ref 0.61–1.24)
GFR calc Af Amer: >60 mL/min (ref >60)
GFR calc non Af Amer: >60 mL/min (ref >60)
Glucose, Bld: 101 mg/dL — ABNORMAL HIGH (ref 70–99)
Potassium: 4.3 mmol/L (ref 3.5–5.1)
Sodium: 138 mmol/L (ref 135–145)
Total Bilirubin: 0.9 mg/dL (ref 0.3–1.2)
Total Protein: 6.1 g/dL — ABNORMAL LOW (ref 6.5–8.1)

## 2020-04-25 LAB — BODY FLUID CELL COUNT WITH DIFFERENTIAL
Lymphs, Fluid: 88 %
Monocyte-Macrophage-Serous Fluid: 6 % — ABNORMAL LOW (ref 50–90)
Neutrophil Count, Fluid: 6 % (ref 0–25)
Total Nucleated Cell Count, Fluid: 1395 cu mm — ABNORMAL HIGH (ref 0–1000)

## 2020-04-25 LAB — CBC
HCT: 38.3 % — ABNORMAL LOW (ref 39.0–52.0)
Hemoglobin: 12.2 g/dL — ABNORMAL LOW (ref 13.0–17.0)
MCH: 28.1 pg (ref 26.0–34.0)
MCHC: 31.9 g/dL (ref 30.0–36.0)
MCV: 88.2 fL (ref 80.0–100.0)
Platelets: 403 10*3/uL — ABNORMAL HIGH (ref 150–400)
RBC: 4.34 MIL/uL (ref 4.22–5.81)
RDW: 13.5 % (ref 11.5–15.5)
WBC: 9.2 10*3/uL (ref 4.0–10.5)
nRBC: 0 % (ref 0.0–0.2)

## 2020-04-25 LAB — GLUCOSE, PLEURAL OR PERITONEAL FLUID: Glucose, Fluid: 73 mg/dL

## 2020-04-25 LAB — GRAM STAIN

## 2020-04-25 LAB — LACTATE DEHYDROGENASE: LDH: 773 U/L — ABNORMAL HIGH (ref 98–192)

## 2020-04-25 LAB — FLOW CYTOMETRY REQUEST - FLUID (INPATIENT)

## 2020-04-25 LAB — LACTATE DEHYDROGENASE, PLEURAL OR PERITONEAL FLUID: LD, Fluid: 2429 U/L — ABNORMAL HIGH (ref 3–23)

## 2020-04-25 LAB — PROTEIN, PLEURAL OR PERITONEAL FLUID: Total protein, fluid: 4 g/dL

## 2020-04-25 SURGERY — BRONCHOSCOPY, WITH EBUS
Anesthesia: General

## 2020-04-25 MED ORDER — ROCURONIUM BROMIDE 100 MG/10ML IV SOLN
INTRAVENOUS | Status: DC | PRN
  Administered 2020-04-25: 100 mg via INTRAVENOUS

## 2020-04-25 MED ORDER — LIDOCAINE HCL (PF) 1 % IJ SOLN
INTRAMUSCULAR | Status: AC | PRN
  Administered 2020-04-25: 10 mL

## 2020-04-25 MED ORDER — LIDOCAINE 2% (20 MG/ML) 5 ML SYRINGE
INTRAMUSCULAR | Status: DC | PRN
  Administered 2020-04-25: 100 mg via INTRAVENOUS

## 2020-04-25 MED ORDER — LIDOCAINE HCL 1 % IJ SOLN
INTRAMUSCULAR | Status: AC
  Filled 2020-04-25: qty 20

## 2020-04-25 MED ORDER — PHENYLEPHRINE 40 MCG/ML (10ML) SYRINGE FOR IV PUSH (FOR BLOOD PRESSURE SUPPORT)
PREFILLED_SYRINGE | INTRAVENOUS | Status: DC | PRN
  Administered 2020-04-25 (×7): 80 ug via INTRAVENOUS

## 2020-04-25 MED ORDER — DEXAMETHASONE SODIUM PHOSPHATE 10 MG/ML IJ SOLN
INTRAMUSCULAR | Status: DC | PRN
  Administered 2020-04-25: 10 mg via INTRAVENOUS

## 2020-04-25 MED ORDER — ONDANSETRON HCL 4 MG/2ML IJ SOLN
INTRAMUSCULAR | Status: DC | PRN
  Administered 2020-04-25: 4 mg via INTRAVENOUS

## 2020-04-25 MED ORDER — SUGAMMADEX SODIUM 200 MG/2ML IV SOLN
INTRAVENOUS | Status: DC | PRN
  Administered 2020-04-25: 200 mg via INTRAVENOUS

## 2020-04-25 MED ORDER — LACTATED RINGERS IV SOLN: INTRAVENOUS | Status: DC | PRN

## 2020-04-25 MED ORDER — PROPOFOL 10 MG/ML IV BOLUS
INTRAVENOUS | Status: DC | PRN
  Administered 2020-04-25: 150 mg via INTRAVENOUS

## 2020-04-25 MED ORDER — PHENYLEPHRINE HCL-NACL 10-0.9 MG/250ML-% IV SOLN
INTRAVENOUS | Status: DC | PRN
  Administered 2020-04-25: 50 ug/min via INTRAVENOUS

## 2020-04-25 MED ORDER — FENTANYL CITRATE (PF) 250 MCG/5ML IJ SOLN
INTRAMUSCULAR | Status: DC | PRN
Start: 2020-04-25 — End: 2020-04-25
  Administered 2020-04-25 (×2): 50 ug via INTRAVENOUS

## 2020-04-25 MED ORDER — FENTANYL CITRATE (PF) 100 MCG/2ML IJ SOLN
INTRAMUSCULAR | Status: AC
  Filled 2020-04-25: qty 4

## 2020-04-25 SURGICAL SUPPLY — 33 items
ADAPTER VALVE BIOPSY EBUS (MISCELLANEOUS) IMPLANT
ADPTR VALVE BIOPSY EBUS (MISCELLANEOUS)
BRUSH CYTOL CELLEBRITY 1.5X140 (MISCELLANEOUS) IMPLANT
CANISTER SUCT 3000ML PPV (MISCELLANEOUS) ×3 IMPLANT
CONT SPEC 4OZ CLIKSEAL STRL BL (MISCELLANEOUS) ×3 IMPLANT
COVER BACK TABLE 60X90IN (DRAPES) ×3 IMPLANT
FORCEPS BIOP RJ4 1.8 (CUTTING FORCEPS) IMPLANT
GAUZE SPONGE 4X4 12PLY STRL (GAUZE/BANDAGES/DRESSINGS) ×3 IMPLANT
GLOVE BIO SURGEON STRL SZ7.5 (GLOVE) ×3 IMPLANT
GOWN STRL REUS W/ TWL LRG LVL3 (GOWN DISPOSABLE) ×2 IMPLANT
GOWN STRL REUS W/ TWL XL LVL3 (GOWN DISPOSABLE) ×2 IMPLANT
GOWN STRL REUS W/TWL LRG LVL3 (GOWN DISPOSABLE) ×3
GOWN STRL REUS W/TWL XL LVL3 (GOWN DISPOSABLE) ×3
KIT CLEAN ENDO COMPLIANCE (KITS) ×6 IMPLANT
KIT TURNOVER KIT B (KITS) ×3 IMPLANT
MARKER SKIN DUAL TIP RULER LAB (MISCELLANEOUS) ×3 IMPLANT
NEEDLE ASPIRATION VIZISHOT 19G (NEEDLE) IMPLANT
NEEDLE ASPIRATION VIZISHOT 21G (NEEDLE) IMPLANT
NS IRRIG 1000ML POUR BTL (IV SOLUTION) ×3 IMPLANT
OIL SILICONE PENTAX (PARTS (SERVICE/REPAIRS)) ×3 IMPLANT
PAD ARMBOARD 7.5X6 YLW CONV (MISCELLANEOUS) ×6 IMPLANT
SYR 20ML ECCENTRIC (SYRINGE) ×6 IMPLANT
SYR 20ML LL LF (SYRINGE) ×6 IMPLANT
SYR 50ML SLIP (SYRINGE) IMPLANT
SYR 5ML LUER SLIP (SYRINGE) ×3 IMPLANT
TOWEL GREEN STERILE FF (TOWEL DISPOSABLE) ×3 IMPLANT
TRAP SPECIMEN MUCOUS 40CC (MISCELLANEOUS) IMPLANT
TUBE CONNECTING 20X1/4 (TUBING) ×6 IMPLANT
UNDERPAD 30X30 (UNDERPADS AND DIAPERS) ×3 IMPLANT
VALVE BIOPSY  SINGLE USE (MISCELLANEOUS) ×3
VALVE BIOPSY SINGLE USE (MISCELLANEOUS) ×2 IMPLANT
VALVE SUCTION BRONCHIO DISP (MISCELLANEOUS) ×3 IMPLANT
WATER STERILE IRR 1000ML POUR (IV SOLUTION) ×3 IMPLANT

## 2020-04-25 NOTE — Anesthesia Procedure Notes (Signed)
Procedure Name: Intubation Date/Time: 04/25/2020 3:14 PM Performed by: Janene Harvey, CRNA Pre-anesthesia Checklist: Patient identified, Emergency Drugs available, Suction available and Patient being monitored Patient Re-evaluated:Patient Re-evaluated prior to induction Oxygen Delivery Method: Circle system utilized Preoxygenation: Pre-oxygenation with 100% oxygen Induction Type: IV induction Ventilation: Mask ventilation without difficulty and Oral airway inserted - appropriate to patient size Laryngoscope Size: Mac and 4 Grade View: Grade I Tube type: Oral Tube size: 8.5 mm Number of attempts: 1 Airway Equipment and Method: Stylet and Oral airway Placement Confirmation: ETT inserted through vocal cords under direct vision,  positive ETCO2 and breath sounds checked- equal and bilateral Secured at: 22 cm Tube secured with: Tape Dental Injury: Teeth and Oropharynx as per pre-operative assessment

## 2020-04-25 NOTE — Anesthesia Postprocedure Evaluation (Signed)
Anesthesia Post Note  Patient: Travis Duran  Procedure(s) Performed: VIDEO BRONCHOSCOPY WITH ENDOBRONCHIAL ULTRASOUND (N/A ) THORACENTESIS FINE NEEDLE ASPIRATION (FNA) LINEAR     Patient location during evaluation: PACU Anesthesia Type: General Level of consciousness: awake and alert Pain management: pain level controlled Vital Signs Assessment: post-procedure vital signs reviewed and stable Respiratory status: spontaneous breathing, nonlabored ventilation, respiratory function stable and patient connected to nasal cannula oxygen Cardiovascular status: blood pressure returned to baseline and stable Postop Assessment: no apparent nausea or vomiting Anesthetic complications: no   No complications documented.  Last Vitals:  Vitals:   04/25/20 1715 04/25/20 1730  BP: 126/89 103/72  Pulse: 98 100  Resp: 18 20  Temp: 36.7 C   SpO2: 91% 92%    Last Pain:  Vitals:   04/25/20 1700  TempSrc:   PainSc: 0-No pain                 Katye Valek DAVID

## 2020-04-25 NOTE — Procedures (Signed)
PROCEDURE SUMMARY:  Successful US guided left thoracentesis. Yielded 1.8 L of dark yellow fluid. Pt tolerated procedure well. No immediate complications.  Specimen sent for labs. CXR ordered: no post-procedure pneumothorax identified. Marland Kitchen  EBL < 2 mL  Theresa Duty, NP 04/25/2020 10:01 AM
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
## 2020-04-25 NOTE — Progress Notes (Signed)
IR aware of biopsy request. CCM planning for EBUS with possible biopsy 04/25/20. Patient also had thoracentesis with labs done today in IR. IR will await the results of EBUS and thoracentesis lab work before initiating IR biopsy work up.   IR will continue to follow and watch for these results to come through.   Please call IR with any questions.  Soyla Dryer, [place] [phone] 04/25/2020, 12:23 PM

## 2020-04-25 NOTE — Anesthesia Preprocedure Evaluation (Signed)
Anesthesia Evaluation  Patient identified by MRN, date of birth, ID band Patient awake    Reviewed: Allergy & Precautions, NPO status , Patient's Chart, lab work & pertinent test results  Airway Mallampati: II       Dental no notable dental hx.    Pulmonary sleep apnea ,    Pulmonary exam normal        Cardiovascular negative cardio ROS Normal cardiovascular exam     Neuro/Psych negative neurological ROS     GI/Hepatic negative GI ROS, Neg liver ROS,   Endo/Other  negative endocrine ROS  Renal/GU negative Renal ROS  negative genitourinary   Musculoskeletal   Abdominal Normal abdominal exam  (+)   Peds  Hematology  (+) anemia ,   Anesthesia Other Findings   Reproductive/Obstetrics                             Anesthesia Physical Anesthesia Plan  ASA: II  Anesthesia Plan: General   Post-op Pain Management:    Induction: Intravenous  PONV Risk Score and Plan: 2 and Ondansetron, Midazolam and Treatment may vary due to age or medical condition  Airway Management Planned: Oral ETT  Additional Equipment: None  Intra-op Plan:   Post-operative Plan: Extubation in OR  Informed Consent: I have reviewed the patients History and Physical, chart, labs and discussed the procedure including the risks, benefits and alternatives for the proposed anesthesia with the patient or authorized representative who has indicated his/her understanding and acceptance.     Dental advisory given  Plan Discussed with: CRNA  Anesthesia Plan Comments:         Anesthesia Quick Evaluation
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
## 2020-04-25 NOTE — Progress Notes (Signed)
PROGRESS NOTE    Dayyan Krist  NTI:[id number] DOB: Dec 30, 1975 DOA: 04/24/2020 PCP: Laurey Morale, MD   Brief Narrative:  44 years old male with medical history significant for osteoarthritis, Erectile dysfunction, obstructive sleep apnea  who developed exertional shortness of breath, and cough for 6 weeks.  He was treated with amoxicillin, Covid test was negative,  PCP has ordered CT chest found to have left upper lobe mass with significant pleural effusion.  Patient was sent to the emergency department. Patient is admitted for left lung mass associated with large pleural effusion.  Patient underwent thoracocentesis, tolerated well.  Pulmonology consulted,  patient underwent bronchoscopy and biopsy was obtained.  Assessment & Plan:   Principal Problem:   Mass of left lung Active Problems:   OBSTRUCTIVE SLEEP APNEA   Pleural effusion  #1 left lung mass with left-sided pleural effusion:  Suspected metastatic disease.  Not a smoker.  No family history.   Patient denies any significant weight loss or appetite changes.   He underwent ultrasound-guided thoracentesis today, tolerated well. Fluid was sent for cytology and other labs results pending.   Pulmonology consulted,  patient underwent bronchoscopy with biopsy of that mass. Patient also underwent repeat thoracocentesis for recurrent effusion. Pathology report is pending, hematology also consulted. May require Pleurx catheter if he has recurrent pleural effusions.  # obstructive sleep apnea: Continue CPAP if needed in the hospital.  # insomnia:  Patient reported not able to sleep due to excessive cough.  He does not want any medications for sleep at this point.    DVT prophylaxis: SCDS. Code Status: Full code. Family Communication:Wife was at bed side. Disposition Plan:   Status is: Inpatient  Remains inpatient appropriate because:Inpatient level of care appropriate due to severity of illness   Dispo: The patient is  from: Home              Anticipated d/c is to: Home              Anticipated d/c date is: 1 day              Patient currently is not medically stable to d/c.  Consultants:   Pulmonology, Oncology  Procedures:  Thoracocentesis, bronchoscopy with mediastinal biopsy.  Antimicrobials:  Anti-infectives (From admission, onward)   None      Subjective: Patient was seen and examined at bedside.  Overnight events noted.  Patient denied any chest pain but reports exertional shortness of breath.  Wife was at bedside, all questions answered.  Patient is a scheduled to have a bronchoscopy with biopsy of this mass today.  Objective: Vitals:   04/25/20 0153 04/25/20 0435 04/25/20 1018 04/25/20 1326  BP: (!) 133/93 (!) 132/91 130/85 (!) 139/97  Pulse: (!) 106 (!) 105 (!) 108 (!) 110  Resp: 20 18 20  (!) 34  Temp: 98.6 F (37 C) 98.5 F (36.9 C) 98.3 F (36.8 C)   TempSrc: Oral Oral Oral   SpO2: 99% 95% 95% 95%  Weight:      Height:        Intake/Output Summary (Last 24 hours) at 04/25/2020 1626 Last data filed at 04/25/2020 1601 Gross per 24 hour  Intake 2514.02 ml  Output --  Net 2514.02 ml   Filed Weights   04/24/20 1619 04/24/20 2016  Weight: 90.7 kg 95.4 kg    Examination:  General exam: Appears calm and comfortable.  Respiratory system: Clear to auscultation. Respiratory effort normal. Cardiovascular system: S1 & S2 heard, RRR. No  JVD, murmurs, rubs, gallops or clicks. No pedal edema. Gastrointestinal system: Abdomen is nondistended, soft and nontender. No organomegaly or masses felt. Normal bowel sounds heard. Central nervous system: Alert and oriented. No focal neurological deficits. Extremities: No edema, no cyanosis, no clubbing. Skin: No rashes, lesions or ulcers Psychiatry: Judgement and insight appear normal. Mood & affect appropriate.     Data Reviewed: I have personally reviewed following labs and imaging studies  CBC: Recent Labs  Lab 04/24/20 1620  04/25/20 0203  WBC 9.6 9.2  NEUTROABS 7.6  --   HGB 13.6 12.2*  HCT 41.5 38.3*  MCV 88.5 88.2  PLT 461* 244*   Basic Metabolic Panel: Recent Labs  Lab 04/24/20 1620 04/25/20 0203  NA 138 138  K 3.8 4.3  CL 104 104  CO2 23 24  GLUCOSE 109* 101*  BUN 10 10  CREATININE 1.02 1.19  CALCIUM 9.1 9.1   GFR: Estimated Creatinine Clearance: 90.3 mL/min (by C-G formula based on SCr of 1.19 mg/dL). Liver Function Tests: Recent Labs  Lab 04/25/20 0203  AST 23  ALT 31  ALKPHOS 81  BILITOT 0.9  PROT 6.1*  ALBUMIN 2.9*   No results for input(s): LIPASE, AMYLASE in the last 168 hours. No results for input(s): AMMONIA in the last 168 hours. Coagulation Profile: No results for input(s): INR, PROTIME in the last 168 hours. Cardiac Enzymes: No results for input(s): CKTOTAL, CKMB, CKMBINDEX, TROPONINI in the last 168 hours. BNP (last 3 results) No results for input(s): PROBNP in the last 8760 hours. HbA1C: No results for input(s): HGBA1C in the last 72 hours. CBG: No results for input(s): GLUCAP in the last 168 hours. Lipid Profile: No results for input(s): CHOL, HDL, LDLCALC, TRIG, CHOLHDL, LDLDIRECT in the last 72 hours. Thyroid Function Tests: No results for input(s): TSH, T4TOTAL, FREET4, T3FREE, THYROIDAB in the last 72 hours. Anemia Panel: No results for input(s): VITAMINB12, FOLATE, FERRITIN, TIBC, IRON, RETICCTPCT in the last 72 hours. Sepsis Labs: No results for input(s): PROCALCITON, LATICACIDVEN in the last 168 hours.  Recent Results (from the past 240 hour(s))  Respiratory Panel by RT PCR (Flu A&B, Covid) - Nasopharyngeal Swab     Status: None   Collection Time: 04/24/20  4:20 PM   Specimen: Nasopharyngeal Swab  Result Value Ref Range Status   SARS Coronavirus 2 by RT PCR NEGATIVE NEGATIVE Final    Comment: (NOTE) SARS-CoV-2 target nucleic acids are NOT DETECTED.  The SARS-CoV-2 RNA is generally detectable in upper respiratoy specimens during the acute phase  of infection. The lowest concentration of SARS-CoV-2 viral copies this assay can detect is 131 copies/mL. A negative result does not preclude SARS-Cov-2 infection and should not be used as the sole basis for treatment or other patient management decisions. A negative result may occur with  improper specimen collection/handling, submission of specimen other than nasopharyngeal swab, presence of viral mutation(s) within the areas targeted by this assay, and inadequate number of viral copies (<131 copies/mL). A negative result must be combined with clinical observations, patient history, and epidemiological information. The expected result is Negative.  Fact Sheet for Patients:  PinkCheek.be  Fact Sheet for Healthcare Providers:  GravelBags.it  This test is no t yet approved or cleared by the [place] FDA and  has been authorized for detection and/or diagnosis of SARS-CoV-2 by FDA under an Emergency Use Authorization (EUA). This EUA will remain  in effect (meaning this test can be used) for the duration of the COVID-19 declaration under  Section 564(b)(1) of the Act, 21 U.S.C. section 360bbb-3(b)(1), unless the authorization is terminated or revoked sooner.     Influenza A by PCR NEGATIVE NEGATIVE Final   Influenza B by PCR NEGATIVE NEGATIVE Final    Comment: (NOTE) The Xpert Xpress SARS-CoV-2/FLU/RSV assay is intended as an aid in  the diagnosis of influenza from Nasopharyngeal swab specimens and  should not be used as a sole basis for treatment. Nasal washings and  aspirates are unacceptable for Xpert Xpress SARS-CoV-2/FLU/RSV  testing.  Fact Sheet for Patients: PinkCheek.be  Fact Sheet for Healthcare Providers: GravelBags.it  This test is not yet approved or cleared by the [place] FDA and  has been authorized for detection and/or diagnosis of  SARS-CoV-2 by  FDA under an Emergency Use Authorization (EUA). This EUA will remain  in effect (meaning this test can be used) for the duration of the  Covid-19 declaration under Section 564(b)(1) of the Act, 21  U.S.C. section 360bbb-3(b)(1), unless the authorization is  terminated or revoked. Performed at [facility], [place] [address], [place], [place] [address]      Radiology Studies: DG Chest 1 View  Result Date: 04/25/2020 CLINICAL DATA:  Shortness of breath.  Status post thoracentesis EXAM: CHEST  1 VIEW COMPARISON:  Chest CT April 24, 2020. FINDINGS: No pneumothorax. Large partially loculated pleural effusion remains on the left. Opacity more medially on the left may well represent a large mass. A degree of atelectasis and consolidation in association with apparent mass cannot be excluded. Smaller pleural effusions seen on the right on chest CT from 1 day prior is not appreciable. There is mild atelectasis in the medial right base. Right lung otherwise is clear. Heart appears mildly enlarged. There is no longer appreciable impression on the heart as was noted on CT from 1 day prior. Pulmonary vascularity appears grossly normal. No adenopathy. No bone lesions. IMPRESSION: Persistent sizable pleural effusion on the left although smaller than on CT examination from 1 day prior. The heart no longer appears significantly compressed as was noted on CT from 1 day prior. Mass seen on CT medial to the effusion persists. There may be associated atelectasis and consolidation in this area. Right lung appears clear except for atelectasis in the medial right base. Heart appears mildly enlarged. Electronically Signed   By: Lowella Grip III M.D.   On: 04/25/2020 09:53   CT Chest W Contrast  Result Date: 04/24/2020 CLINICAL DATA:  Left pleural effusion. Dyspnea with exertion for 1 month. Chronic cough. EXAM: CT CHEST WITH CONTRAST TECHNIQUE: Multidetector CT imaging of the chest was performed  during intravenous contrast administration. CONTRAST:  8mL ISOVUE-300 IOPAMIDOL (ISOVUE-300) INJECTION 61% COMPARISON:  04/03/2020 chest radiograph. FINDINGS: Cardiovascular: Normal heart size. Small pericardial effusion with suggestion of mild pericardial thickening and enhancement. Great vessels are normal in course and caliber. No central pulmonary emboli. Mediastinum/Nodes: Mildly heterogeneous thyroid gland without discrete thyroid nodules. Unremarkable esophagus. No axillary adenopathy. Large heterogeneously enhancing poorly marginated solid 14.6 x 11.7 x 18.5 cm left anterior mediastinal mass (series 2/image 62) with prominent mass-effect on the right shifted great vessels and heart, extending superiorly nearly to the thoracic inlet and inferiorly to the left pericardiophrenic region, contiguous with the left hilum and potentially directly invading the left upper lung. No discrete hilar adenopathy. Lungs/Pleura: No pneumothorax. Small dependent right pleural effusion. Large left pleural effusion. Widespread enhancing left pleural nodularity, for example measuring 2.6 cm at the anterior left apex (series 2/image 33) and  2.8 cm posteriorly and inferiorly in the left pleural space (series 2/image 131). Complete left lower lobe and near complete left upper lobe atelectasis. No additional significant pulmonary nodules. Upper abdomen: No acute abnormality. Musculoskeletal:  No aggressive appearing focal osseous lesions. IMPRESSION: 1. 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 Differential considerations include primary thymic malignancy, primary bronchogenic carcinoma or  less likely lymphoma. 2. Widespread enhancing left pleural nodularity compatible with pleural metastatic disease. 3. Large left pleural effusion with near complete left lung atelectasis. 4. Small dependent right pleural effusion. 5. Small pericardial effusion with suggestion of mild pericardial thickening and enhancement, cannot exclude malignant pericardial effusion. These results were called by telephone at the time of interpretation on 04/24/2020 at 2:41 pm to [place], RN in the office of provider Alysia Penna, who verbally acknowledged these results. The patient will be transported to the ED via ambulance from the imaging center for further evaluation. Electronically Signed   By: Ilona Sorrel M.D.   On: 04/24/2020 15:07   IR THORACENTESIS ASP PLEURAL SPACE W/IMG GUIDE  Result Date: 04/25/2020 INDICATION: Patient with newly diagnosed lung mass with large left pleural effusion presents today for a therapeutic and diagnostic thoracentesis. EXAM: ULTRASOUND GUIDED THORACENTESIS MEDICATIONS: 1% lidocaine 10 COMPLICATIONS: None immediate. PROCEDURE: An ultrasound guided thoracentesis was thoroughly discussed with the patient and questions answered. The benefits, risks, alternatives and complications were also discussed. The patient understands and wishes to proceed with the procedure. Written consent was obtained. Ultrasound was performed to localize and mark an adequate pocket of fluid in the left chest. The area was then prepped and draped in the normal sterile fashion. 1% Lidocaine was used for local anesthesia. Under ultrasound guidance a 6 Fr Safe-T-Centesis catheter was introduced. Thoracentesis was performed. The catheter was removed and a dressing applied. FINDINGS: A total of approximately 1.8 L of dark yellow fluid was removed. Samples were sent to the laboratory as requested by the clinical team. IMPRESSION: Successful ultrasound guided left thoracentesis yielding 1.8 L of pleural fluid. Read by: Soyla Dryer, NP Electronically Signed   By: Jacqulynn Cadet M.D.   On: 04/25/2020 10:03    Scheduled Meds: . [MAR Hold] heparin  5,000 Units Subcutaneous Q8H  . influenza vac split quadrivalent PF  0.5 mL Intramuscular Tomorrow-1000   Continuous Infusions: . sodium chloride Stopped (04/25/20 1314)     LOS: 1 day    Time spent: 35 mins.    Shawna Clamp, MD [facility]   If 7PM-7AM, please contact night-coverage

## 2020-04-25 NOTE — Transfer of Care (Signed)
Immediate Anesthesia Transfer of Care Note  Patient: Travis Duran  Procedure(s) Performed: VIDEO BRONCHOSCOPY WITH ENDOBRONCHIAL ULTRASOUND (N/A ) THORACENTESIS FINE NEEDLE ASPIRATION (FNA) LINEAR  Patient Location: Endoscopy Unit  Anesthesia Type:General  Level of Consciousness: awake  Airway & Oxygen Therapy: Patient Spontanous Breathing and Patient connected to nasal cannula oxygen  Post-op Assessment: Report given to RN and Post -op Vital signs reviewed and stable  Post vital signs: Reviewed  Last Vitals:  Vitals Value Taken Time  BP 135/94 04/25/20 1630  Temp 36.7 C 04/25/20 1630  Pulse 102 04/25/20 1633  Resp 31 04/25/20 1633  SpO2 92 % 04/25/20 1633  Vitals shown include unvalidated device data.  Last Pain:  Vitals:   04/25/20 1630  TempSrc:   PainSc: 0-No pain         Complications: No complications documented.

## 2020-04-25 NOTE — Procedures (Signed)
Thoracentesis  Procedure Note  Travis Duran  [id number]  July 30, 1976  Date:04/25/20  Time:4:19 PM   Provider Performing:Egor Fullilove C Tamala Julian   Procedure: Thoracentesis with imaging guidance (69629)  Indication(s) Pleural Effusion  Consent Risks of the procedure as well as the alternatives and risks of each were explained to the patient and/or caregiver.  Consent for the procedure was obtained and is signed in the bedside chart  Anesthesia Topical only with 1% lidocaine    Time Out Verified patient identification, verified procedure, site/side was marked, verified correct patient position, special equipment/implants available, medications/allergies/relevant history reviewed, required imaging and test results available.   Sterile Technique Maximal sterile technique including full sterile barrier drape, hand hygiene, sterile gown, sterile gloves, mask, hair covering, sterile ultrasound probe cover (if used).  Procedure Description Ultrasound was used to identify appropriate pleural anatomy for placement and overlying skin marked.  Area of drainage cleaned and draped in sterile fashion. Lidocaine was used to anesthetize the skin and subcutaneous tissue.  1900 cc's of straw cloudy appearing fluid was drained from the left pleural space. Catheter then removed and bandaid applied to site.   Complications/Tolerance None; patient tolerated the procedure well. Chest X-ray is ordered to confirm no post-procedural complication.   EBL Minimal   Specimen(s) Pleural fluid

## 2020-04-25 NOTE — Procedures (Signed)
Flexible and EBUS Bronchoscopy Procedure Note  Travis Duran  [id number]  [birth year]  Date:04/25/20  Time:4:17 PM   Provider Performing:Jamille Fisher C Tamala Julian   Procedure: Flexible bronchoscopy and EBUS Bronchoscopy  Indication(s) Anterior mediastinal mass  Consent Risks of the procedure as well as the alternatives and risks of each were explained to the patient and/or caregiver.  Consent for the procedure was obtained.  Anesthesia General Anesthesia   Time Out Verified patient identification, verified procedure, site/side was marked, verified correct patient position, special equipment/implants available, medications/allergies/relevant history reviewed, required imaging and test results available.   Sterile Technique Usual hand hygiene, masks, gowns, and gloves were used   Procedure Description EBUS bronchoscope was advanced into airway into left mainstem and angled anteriorly to biopsy anterior mediastinal mass under US guidance.  ~10 passes were biopsied and sent for slide, cell block, and/or culture.  The EBUS bronchoscope was removed after assuring no active bleeding from biopsy site.  Findings:  - Extrinsic compression of left lung starting from left mainstem onward - No endobronchial lesions   Complications/Tolerance None; patient tolerated the procedure well. Chest X-ray is needed post procedure.   EBL Minimal   Specimen(s) Anterior mediastinal mass FNA

## 2020-04-26 ENCOUNTER — Inpatient Hospital Stay (HOSPITAL_COMMUNITY): Payer: BC Managed Care – PPO

## 2020-04-26 DIAGNOSIS — Z9889 Other specified postprocedural states: Secondary | ICD-10-CM

## 2020-04-26 LAB — CBC
HCT: 40.6 % (ref 39.0–52.0)
Hemoglobin: 13.2 g/dL (ref 13.0–17.0)
MCH: 28.3 pg (ref 26.0–34.0)
MCHC: 32.5 g/dL (ref 30.0–36.0)
MCV: 87.1 fL (ref 80.0–100.0)
Platelets: 416 10*3/uL — ABNORMAL HIGH (ref 150–400)
RBC: 4.66 MIL/uL (ref 4.22–5.81)
RDW: 13.3 % (ref 11.5–15.5)
WBC: 8.7 10*3/uL (ref 4.0–10.5)
nRBC: 0 % (ref 0.0–0.2)

## 2020-04-26 LAB — COMPREHENSIVE METABOLIC PANEL
ALT: 34 U/L (ref 0–44)
AST: 26 U/L (ref 15–41)
Albumin: 2.8 g/dL — ABNORMAL LOW (ref 3.5–5.0)
Alkaline Phosphatase: 78 U/L (ref 38–126)
Anion gap: 11 (ref 5–15)
BUN: 11 mg/dL (ref 6–20)
CO2: 22 mmol/L (ref 22–32)
Calcium: 9 mg/dL (ref 8.9–10.3)
Chloride: 106 mmol/L (ref 98–111)
Creatinine, Ser: 1.13 mg/dL (ref 0.61–1.24)
GFR calc Af Amer: >60 mL/min (ref >60)
GFR calc non Af Amer: >60 mL/min (ref >60)
Glucose, Bld: 139 mg/dL — ABNORMAL HIGH (ref 70–99)
Potassium: 4.3 mmol/L (ref 3.5–5.1)
Sodium: 139 mmol/L (ref 135–145)
Total Bilirubin: 0.7 mg/dL (ref 0.3–1.2)
Total Protein: 5.8 g/dL — ABNORMAL LOW (ref 6.5–8.1)

## 2020-04-26 LAB — PHOSPHORUS: Phosphorus: 2.9 mg/dL (ref 2.5–4.6)

## 2020-04-26 LAB — MAGNESIUM: Magnesium: 1.7 mg/dL (ref 1.7–2.4)

## 2020-04-26 LAB — LACTATE DEHYDROGENASE: LDH: 645 U/L — ABNORMAL HIGH (ref 98–192)

## 2020-04-26 LAB — TRIGLYCERIDES, BODY FLUIDS: Triglycerides, Fluid: 25 mg/dL

## 2020-04-26 LAB — BETA HCG QUANT (REF LAB): hCG Quant: 1 m[IU]/mL (ref 0–3)

## 2020-04-26 LAB — AFP TUMOR MARKER: AFP, Serum, Tumor Marker: 1.1 ng/mL (ref 0.0–8.3)

## 2020-04-26 MED ORDER — IOHEXOL 300 MG/ML  SOLN
100.0000 mL | Freq: Once | INTRAMUSCULAR | Status: AC | PRN
  Administered 2020-04-26: 100 mL via INTRAVENOUS

## 2020-04-26 MED ORDER — GADOBUTROL 1 MMOL/ML IV SOLN
10.0000 mL | Freq: Once | INTRAVENOUS | Status: AC | PRN
  Administered 2020-04-26: 10 mL via INTRAVENOUS

## 2020-04-26 MED ORDER — IOHEXOL 9 MG/ML PO SOLN
500.0000 mL | ORAL | Status: AC
  Administered 2020-04-26 (×2): 500 mL via ORAL

## 2020-04-26 NOTE — Plan of Care (Signed)
  Problem: Education: Goal: Knowledge of General Education information will improve Description Including pain rating scale, medication(s)/side effects and non-pharmacologic comfort measures Outcome: Progressing   

## 2020-04-26 NOTE — Progress Notes (Signed)
PROGRESS NOTE    Travis Duran  VEL:[id number] DOB: 1975-10-05 DOA: 04/24/2020 PCP: Laurey Morale, MD   Brief Narrative:  44 years old male with medical history significant for osteoarthritis, Erectile dysfunction, obstructive sleep apnea  who developed exertional shortness of breath, and cough for 6 weeks.  He was treated with amoxicillin, Covid test was negative,  PCP has ordered CT chest found to have left upper lobe mass with significant pleural effusion.  Patient was sent to the emergency department. Patient is admitted for left lung mass associated with large pleural effusion.  Patient underwent thoracocentesis x 3, tolerated well.  Pulmonology consulted,  patient underwent bronchoscopy and biopsy was obtained. CT Abdomen showed lesion in the liver, MRCP ordered.  Assessment & Plan:   Principal Problem:   Mass of left lung Active Problems:   OBSTRUCTIVE SLEEP APNEA   Pleural effusion   History of thoracentesis  #1 left lung mass with left-sided pleural effusion:  Suspected metastatic disease.  Not a smoker.  No family history.   Patient denies any significant weight loss or appetite changes.   He underwent ultrasound-guided thoracentesis 9/24, tolerated well. Fluid was sent for cytology and other labs results pending.   Pulmonology consulted,  patient underwent bronchoscopy with biopsy of that mass. Patient also underwent repeat thoracocentesis for recurrent effusion. Pathology report is pending, hematology also consulted. May require Pleurx catheter if he has recurrent pleural effusions. CT Abd showed lesion in liver, MRCP ordered, follow up  Patient wanted to be discharged, Discussed with Dr. Marin Olp advised inpatient workup   # obstructive sleep apnea: Continue CPAP if needed in the hospital.  # insomnia:  Patient reported not able to sleep due to excessive cough.  He does not want any medications for sleep at this point.    DVT prophylaxis: SCDS. Code Status: Full  code. Family Communication:Wife was at bed side. Disposition Plan:   Status is: Inpatient  Remains inpatient appropriate because:Inpatient level of care appropriate due to severity of illness   Dispo: The patient is from: Home              Anticipated d/c is to: Home              Anticipated d/c date is: 1 day              Patient currently is not medically stable to d/c.  Consultants:   Pulmonology, Oncology  Procedures:  Thoracocentesis, bronchoscopy with mediastinal biopsy.  Antimicrobials:  Anti-infectives (From admission, onward)   None      Subjective: Patient was seen and examined at bedside.  Overnight events noted.  Patient seems worried, Anxious , denies any chest pain but reports exertional shortness of breath.  Wife was at bedside, all questions answered.  Patient is a scheduled to have  MRCP today.  Objective: Vitals:   04/26/20 1111 04/26/20 1200 04/26/20 1225 04/26/20 1308  BP: 111/87 121/83 110/81 112/73  Pulse: (!) 107 (!) 111 (!) 109 (!) 109  Resp: 20 19 17    Temp: 98.1 F (36.7 C)   97.8 F (36.6 C)  TempSrc: Oral   Oral  SpO2: 95% 96% 96% 95%  Weight:      Height:        Intake/Output Summary (Last 24 hours) at 04/26/2020 1647 Last data filed at 04/26/2020 1112 Gross per 24 hour  Intake 1146.95 ml  Output --  Net 1146.95 ml   Filed Weights   04/24/20 1619 04/24/20 2016  Weight:  90.7 kg 95.4 kg    Examination:  General exam: Appears calm and comfortable. Appears Anxious Respiratory system: Clear to auscultation. Respiratory effort normal. Cardiovascular system: S1 & S2 heard, RRR. No JVD, murmurs, rubs, gallops or clicks. No pedal edema. Gastrointestinal system: Abdomen is nondistended, soft and nontender. No organomegaly or masses felt. Normal bowel sounds heard. Central nervous system: Alert and oriented. No focal neurological deficits. Extremities: No edema, no cyanosis, no clubbing. Skin: No rashes, lesions or ulcers Psychiatry:  Judgement and insight appear normal. Mood & affect appropriate.     Data Reviewed: I have personally reviewed following labs and imaging studies  CBC: Recent Labs  Lab 04/24/20 1620 04/25/20 0203 04/26/20 0240  WBC 9.6 9.2 8.7  NEUTROABS 7.6  --   --   HGB 13.6 12.2* 13.2  HCT 41.5 38.3* 40.6  MCV 88.5 88.2 87.1  PLT 461* 403* 673*   Basic Metabolic Panel: Recent Labs  Lab 04/24/20 1620 04/25/20 0203 04/26/20 0240  NA 138 138 139  K 3.8 4.3 4.3  CL 104 104 106  CO2 23 24 22   GLUCOSE 109* 101* 139*  BUN 10 10 11   CREATININE 1.02 1.19 1.13  CALCIUM 9.1 9.1 9.0  MG  --   --  1.7  PHOS  --   --  2.9   GFR: Estimated Creatinine Clearance: 95.1 mL/min (by C-G formula based on SCr of 1.13 mg/dL). Liver Function Tests: Recent Labs  Lab 04/25/20 0203 04/26/20 0240  AST 23 26  ALT 31 34  ALKPHOS 81 78  BILITOT 0.9 0.7  PROT 6.1* 5.8*  ALBUMIN 2.9* 2.8*   No results for input(s): LIPASE, AMYLASE in the last 168 hours. No results for input(s): AMMONIA in the last 168 hours. Coagulation Profile: No results for input(s): INR, PROTIME in the last 168 hours. Cardiac Enzymes: No results for input(s): CKTOTAL, CKMB, CKMBINDEX, TROPONINI in the last 168 hours. BNP (last 3 results) No results for input(s): PROBNP in the last 8760 hours. HbA1C: No results for input(s): HGBA1C in the last 72 hours. CBG: No results for input(s): GLUCAP in the last 168 hours. Lipid Profile: No results for input(s): CHOL, HDL, LDLCALC, TRIG, CHOLHDL, LDLDIRECT in the last 72 hours. Thyroid Function Tests: No results for input(s): TSH, T4TOTAL, FREET4, T3FREE, THYROIDAB in the last 72 hours. Anemia Panel: No results for input(s): VITAMINB12, FOLATE, FERRITIN, TIBC, IRON, RETICCTPCT in the last 72 hours. Sepsis Labs: No results for input(s): PROCALCITON, LATICACIDVEN in the last 168 hours.  Recent Results (from the past 240 hour(s))  Respiratory Panel by RT PCR (Flu A&B, Covid) -  Nasopharyngeal Swab     Status: None   Collection Time: 04/24/20  4:20 PM   Specimen: Nasopharyngeal Swab  Result Value Ref Range Status   SARS Coronavirus 2 by RT PCR NEGATIVE NEGATIVE Final    Comment: (NOTE) SARS-CoV-2 target nucleic acids are NOT DETECTED.  The SARS-CoV-2 RNA is generally detectable in upper respiratoy specimens during the acute phase of infection. The lowest concentration of SARS-CoV-2 viral copies this assay can detect is 131 copies/mL. A negative result does not preclude SARS-Cov-2 infection and should not be used as the sole basis for treatment or other patient management decisions. A negative result may occur with  improper specimen collection/handling, submission of specimen other than nasopharyngeal swab, presence of viral mutation(s) within the areas targeted by this assay, and inadequate number of viral copies (<131 copies/mL). A negative result must be combined with clinical observations, patient  history, and epidemiological information. The expected result is Negative.  Fact Sheet for Patients:  PinkCheek.be  Fact Sheet for Healthcare Providers:  GravelBags.it  This test is no t yet approved or cleared by the [place] FDA and  has been authorized for detection and/or diagnosis of SARS-CoV-2 by FDA under an Emergency Use Authorization (EUA). This EUA will remain  in effect (meaning this test can be used) for the duration of the COVID-19 declaration under Section 564(b)(1) of the Act, 21 U.S.C. section 360bbb-3(b)(1), unless the authorization is terminated or revoked sooner.     Influenza A by PCR NEGATIVE NEGATIVE Final   Influenza B by PCR NEGATIVE NEGATIVE Final    Comment: (NOTE) The Xpert Xpress SARS-CoV-2/FLU/RSV assay is intended as an aid in  the diagnosis of influenza from Nasopharyngeal swab specimens and  should not be used as a sole basis for treatment. Nasal washings and    aspirates are unacceptable for Xpert Xpress SARS-CoV-2/FLU/RSV  testing.  Fact Sheet for Patients: PinkCheek.be  Fact Sheet for Healthcare Providers: GravelBags.it  This test is not yet approved or cleared by the [place] FDA and  has been authorized for detection and/or diagnosis of SARS-CoV-2 by  FDA under an Emergency Use Authorization (EUA). This EUA will remain  in effect (meaning this test can be used) for the duration of the  Covid-19 declaration under Section 564(b)(1) of the Act, 21  U.S.C. section 360bbb-3(b)(1), unless the authorization is  terminated or revoked. Performed at [facility], [place] [address] [place], [place] [address]   Gram stain     Status: None   Collection Time: 04/25/20  9:25 AM   Specimen: Lung, Left; Pleural Fluid  Result Value Ref Range Status   Specimen Description FLUID  Final   Special Requests LUNG  Final   Gram Stain   Final    WBC PRESENT,BOTH PMN AND MONONUCLEAR NO ORGANISMS SEEN CYTOSPIN SMEAR Performed at [facility], [address], [place], [place] [address]    Report Status 04/25/2020 FINAL  Final     Radiology Studies: DG Chest 1 View  Result Date: 04/25/2020 CLINICAL DATA:  Post video bronchoscopy. EXAM: CHEST  1 VIEW COMPARISON:  Chest x-ray dated 04/25/2020 FINDINGS: There is some improved aeration in the left zone however, there is persistent diffuse opacification of the left hemithorax. The cardiac silhouette is difficult to fully evaluate on this study but appears similar to prior study. A large mediastinal mass is again noted. There is no pneumothorax. There is a persistent at least moderate-sized left-sided pleural effusion. IMPRESSION: 1. No pneumothorax. 2. Persistent diffuse opacification of the left hemithorax. 3. Persistent large mediastinal mass. 4. Persistent at least moderate-sized left-sided pleural effusion. Electronically Signed   By:  Constance Holster M.D.   On: 04/25/2020 16:58   DG Chest 1 View  Result Date: 04/25/2020 CLINICAL DATA:  Shortness of breath.  Status post thoracentesis EXAM: CHEST  1 VIEW COMPARISON:  Chest CT April 24, 2020. FINDINGS: No pneumothorax. Large partially loculated pleural effusion remains on the left. Opacity more medially on the left may well represent a large mass. A degree of atelectasis and consolidation in association with apparent mass cannot be excluded. Smaller pleural effusions seen on the right on chest CT from 1 day prior is not appreciable. There is mild atelectasis in the medial right base. Right lung otherwise is clear. Heart appears mildly enlarged. There is no longer appreciable impression on the heart as was  noted on CT from 1 day prior. Pulmonary vascularity appears grossly normal. No adenopathy. No bone lesions. IMPRESSION: Persistent sizable pleural effusion on the left although smaller than on CT examination from 1 day prior. The heart no longer appears significantly compressed as was noted on CT from 1 day prior. Mass seen on CT medial to the effusion persists. There may be associated atelectasis and consolidation in this area. Right lung appears clear except for atelectasis in the medial right base. Heart appears mildly enlarged. Electronically Signed   By: Lowella Grip III M.D.   On: 04/25/2020 09:53   CT ABDOMEN PELVIS W CONTRAST  Result Date: 04/26/2020 CLINICAL DATA:  Newly diagnosed non-small cell lung carcinoma. Staging. EXAM: CT ABDOMEN AND PELVIS WITH CONTRAST TECHNIQUE: Multidetector CT imaging of the abdomen and pelvis was performed using the standard protocol following bolus administration of intravenous contrast. CONTRAST:  115mL OMNIPAQUE IOHEXOL 300 MG/ML  SOLN COMPARISON:  Chest CT on 04/24/2020 FINDINGS: Lower Chest: Large anterior mediastinal mass and small bilateral pleural effusions are again seen, as better visualized on recent chest CT. Hepatobiliary: A 2  cm hypovascular lesion is seen in the posterior right hepatic lobe, which has nonspecific features. No other liver masses are identified. Gallbladder is unremarkable. No evidence of biliary ductal dilatation. Pancreas:  No mass or inflammatory changes. Spleen: Within normal limits in size and appearance. Adrenals/Urinary Tract: No masses identified. No evidence of ureteral calculi or hydronephrosis. Stomach/Bowel: No evidence of obstruction, inflammatory process or abnormal fluid collections. Vascular/Lymphatic: No pathologically enlarged lymph nodes. No abdominal aortic aneurysm. Reproductive:  No mass or other significant abnormality. Other:  None. Musculoskeletal:  No suspicious bone lesions identified. IMPRESSION: 2 cm nonspecific hypovascular lesion in posterior right hepatic lobe. Recommend abdomen MRI without and with contrast for further characterization. No other sites of metastatic disease identified within the abdomen or pelvis. Large anterior mediastinal mass and bilateral pleural effusions, as better demonstrated on recent chest CT. Electronically Signed   By: Marlaine Hind M.D.   On: 04/26/2020 14:15   DG Chest Port 1 View  Result Date: 04/26/2020 CLINICAL DATA:  Status post thoracentesis. EXAM: PORTABLE CHEST 1 VIEW COMPARISON:  Radiograph yesterday.  Chest CT 04/24/2020 FINDINGS: Decreased size of left pleural effusion after thoracentesis with increasing aeration decreasing hazy opacity. There is persistent blunting of the medial and lateral costophrenic angles and loculated fluid tracking at the apex. No definite pneumothorax. Slight residual rightward tracheal deviation. Heart size and mediastinal contours are obscured, however well-defined density in the left hemithorax likely corresponds to anterior mediastinal mass on chest CT. The right lung is clear. IMPRESSION: 1. Decreased size of left pleural effusion after thoracentesis with increasing aeration in the left hemithorax and decreasing hazy  opacity. No definite pneumothorax. 2. Small volume of persistent partially loculated left pleural fluid. Electronically Signed   By: Keith Rake M.D.   On: 04/26/2020 15:38   IR THORACENTESIS ASP PLEURAL SPACE W/IMG GUIDE  Result Date: 04/25/2020 INDICATION: Patient with newly diagnosed lung mass with large left pleural effusion presents today for a therapeutic and diagnostic thoracentesis. EXAM: ULTRASOUND GUIDED THORACENTESIS MEDICATIONS: 1% lidocaine 10 COMPLICATIONS: None immediate. PROCEDURE: An ultrasound guided thoracentesis was thoroughly discussed with the patient and questions answered. The benefits, risks, alternatives and complications were also discussed. The patient understands and wishes to proceed with the procedure. Written consent was obtained. Ultrasound was performed to localize and mark an adequate pocket of fluid in the left chest. The area was then prepped and  draped in the normal sterile fashion. 1% Lidocaine was used for local anesthesia. Under ultrasound guidance a 6 Fr Safe-T-Centesis catheter was introduced. Thoracentesis was performed. The catheter was removed and a dressing applied. FINDINGS: A total of approximately 1.8 L of dark yellow fluid was removed. Samples were sent to the laboratory as requested by the clinical team. IMPRESSION: Successful ultrasound guided left thoracentesis yielding 1.8 L of pleural fluid. Read by: Soyla Dryer, NP Electronically Signed   By: Jacqulynn Cadet M.D.   On: 04/25/2020 10:03    Scheduled Meds: . heparin  5,000 Units Subcutaneous Q8H  . influenza vac split quadrivalent PF  0.5 mL Intramuscular Tomorrow-1000   Continuous Infusions:    LOS: 2 days    Time spent: 25 mins.    Shawna Clamp, MD [facility]   If 7PM-7AM, please contact night-coverage

## 2020-04-26 NOTE — Procedures (Signed)
Thoracentesis Procedure Note  Pre-operative Diagnosis: left lung mass  Post-operative Diagnosis: normal  Indications: pleural fluid  Procedure Details  Consent: Informed consent was obtained. Risks of the procedure were discussed including: infection, bleeding, pain, pneumothorax.  Under sterile conditions the patient was positioned. Betadine solution and sterile drapes were utilized.  1% buffered lidocaine was used to anesthetize the left  6th  rib space. Fluid was obtained without any difficulties and minimal blood loss.  A dressing was applied to the wound and wound care instructions were provided.   Findings 1000 ml of bloody pleural fluid was obtained. A sample was sent to Pathology for cytogenetics, flow, and cell counts, as well as for infection analysis.  Complications:  None; patient tolerated the procedure well.          Condition: stable  Plan A follow up chest x-ray was ordered. Bed Rest for 2 hours. Tylenol 650 mg. for pain.  Richardson Landry Karlee Staff ACNP Acute Care Nurse Practitioner [place] Please consult [place] 04/26/2020, 12:23 PM

## 2020-04-26 NOTE — Progress Notes (Signed)
04/26/2020 S: Seen in f/u for pleural effusion. Breathing improved. Tolerated bronchoscopy well. Denies hemoptysis, fevers. Had abd ct this am, read pending.  O Today's Vitals   04/26/20 0108 04/26/20 0158 04/26/20 0401 04/26/20 1111  BP: 124/81  126/83 111/87  Pulse: 100  99 (!) 107  Resp: 20  20 20   Temp: 98.1 F (36.7 C)  98 F (36.7 C) 98.1 F (36.7 C)  TempSrc: Oral  Oral Oral  SpO2: 97%  97% 95%  Weight:      Height:      PainSc:  0-No pain     Body mass index is 31.06 kg/m.  Breathing comfortably on RA.  Mentatin well.  A: Anterior mediastinal mass and associated left pleural effusion.  P: - Complete drainage of left pleural space today, if recurrence as OP, would benefit from pleurX while malignancy is treated - Await path from bronch, if nondiagnostic, a percutaneous anterior approach can be done - Will arrange close OP f/u in clinic, okay for home from my standpoint once echo done  Erskine Emery MD PCCM

## 2020-04-27 ENCOUNTER — Inpatient Hospital Stay (HOSPITAL_COMMUNITY): Payer: BC Managed Care – PPO

## 2020-04-27 DIAGNOSIS — Z0189 Encounter for other specified special examinations: Secondary | ICD-10-CM

## 2020-04-27 LAB — ACID FAST SMEAR (AFB, MYCOBACTERIA): Acid Fast Smear: NEGATIVE

## 2020-04-27 LAB — HEPATITIS PANEL, ACUTE
HCV Ab: NONREACTIVE
Hep A IgM: NONREACTIVE
Hep B C IgM: NONREACTIVE
Hepatitis B Surface Ag: NONREACTIVE

## 2020-04-27 LAB — LACTATE DEHYDROGENASE: LDH: 670 U/L — ABNORMAL HIGH (ref 98–192)

## 2020-04-27 LAB — ECHOCARDIOGRAM COMPLETE
Height: 69 in
Weight: 3365.1 oz

## 2020-04-27 MED ORDER — TRAZODONE HCL 50 MG PO TABS
50.0000 mg | ORAL_TABLET | Freq: Every evening | ORAL | Status: DC | PRN
  Administered 2020-04-27: 50 mg via ORAL
  Filled 2020-04-27: qty 1

## 2020-04-27 NOTE — Progress Notes (Signed)
Echocardiogram 2D Echocardiogram has been performed.  Oneal Deputy Alvaretta Eisenberger 04/27/2020, 10:50 AM

## 2020-04-27 NOTE — Progress Notes (Signed)
So far, he has no obvious evidence of disease outside of the thoracic cavity.  There is a 2 cm hypervascular lesion seen on CT scan.  He subsequently had a MRI of the liver.  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  Unfortunately, I do not believe that he has a primary mediastinal germ cell tumor given that his alpha-fetoprotein and beta-hCG were normal.  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  Lymphoblastic lymphoma is always a possibility although I typically see that in younger people and in children and able does not have abnormal blood work that goes along with this.  If this mass is anything else, this is good to be a very difficult problem to treat.  I think thymoma or thymic cancer might be the exception.  If this is a sarcoma, we are really going to have a hard time.  He really needs to have a percutaneous biopsy of this mass.  It should be easily obtainable by interventional radiology.  I put the order in on Friday for her to be done on Monday.  If this is a lymphoma or Hodgkin's disease, he will need treatment relatively quickly.  If we are dealing with a mediastinal B-cell lymphoma, the chemotherapy as inpatient.  If this is Hodgkin's disease, treatment would be outpatient.  While he is here, I would get an echocardiogram on him.  I understand the reason for him wanting to go home.  However, all work-up can be done much more expeditiously while he is in the hospital.  I very much appreciate all the great care that he is getting from the staff up on [address]  Lattie Haw, MD  Jeneen Rinks 1:5

## 2020-04-27 NOTE — Progress Notes (Signed)
PROGRESS NOTE    Travis Duran  LZJ:[id number] DOB: 11-08-1975 DOA: 04/24/2020 PCP: Laurey Morale, MD   Brief Narrative:  44 years old male with medical history significant for osteoarthritis, Erectile dysfunction, obstructive sleep apnea  who developed exertional shortness of breath, and cough for 6 weeks.  He was treated with amoxicillin, Covid test was negative,  PCP has ordered CT chest found to have left upper lobe mass with significant pleural effusion.  Patient was sent to the emergency department. Patient is admitted for left lung mass associated with large pleural effusion.  Patient underwent thoracocentesis x 3, tolerated well.  Pulmonology consulted,  patient underwent bronchoscopy and biopsy was obtained. CT Abdomen showed lesion in the liver, MRCP ordered.   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  Seen by pulmonary status post thoracentesis.  Cytology results from the fluid analysis is pending. -Oncology recommending CT-guided biopsy which has been ordered and scheduled for tomorrow. -MRCP-1.6 cm complex cyst in the posterior right hepatic lobe and large anterior mediastinal mass with malignant pleural effusion  Obstructive sleep apnea:Continue CPAP if needed in the hospital.  Insomnia As needed trazodone     DVT prophylaxis: Subcu heparin Code Status: Full code Family Communication: Wife at bedside  Status is: Inpatient  Remains inpatient appropriate because:Inpatient level of care appropriate due to severity of illness   Dispo: The patient is from: Home              Anticipated d/c is to: Home              Anticipated d/c date is: 1 day              Patient currently is not medically  stable to d/c.  Undergoing CT-guided biopsy tomorrow per oncology.   Body mass index is 31.06 kg/m.      Subjective: Patient in the shower during my visit therefore spoke with the patient's wife at length and all the questions answered.  Overall patient is doing well and only has some coughing at this time.  Review of Systems Unable to obtain  Examination: Unable to perform  Objective: Vitals:   04/26/20 1225 04/26/20 1308 04/26/20 2024 04/27/20 0451  BP: 110/81 112/73 127/78 117/80  Pulse: (!) 109 (!) 109 (!) 110 (!) 106  Resp: 17  16 17   Temp:  97.8 F (36.6 C) 97.8 F (36.6 C) 98.5 F (36.9 C)  TempSrc:  Oral Oral Oral  SpO2: 96% 95% 94% 95%  Weight:      Height:        Intake/Output Summary (Last 24 hours) at 04/27/2020 1355 Last data filed at 04/27/2020 0453 Gross per 24 hour  Intake 360 ml  Output --  Net 360 ml   Filed Weights   04/24/20 1619 04/24/20 2016  Weight: 90.7 kg 95.4 kg     Data Reviewed:   CBC: Recent Labs  Lab 04/24/20 1620 04/25/20 0203 04/26/20 0240  WBC 9.6 9.2 8.7  NEUTROABS 7.6  --   --   HGB 13.6 12.2* 13.2  HCT 41.5 38.3* 40.6  MCV 88.5 88.2 87.1  PLT 461* 403* 024*   Basic Metabolic Panel: Recent Labs  Lab 04/24/20 1620 04/25/20 0203 04/26/20 0240  NA 138  138 139  K 3.8 4.3 4.3  CL 104 104 106  CO2 23 24 22   GLUCOSE 109* 101* 139*  BUN 10 10 11   CREATININE 1.02 1.19 1.13  CALCIUM 9.1 9.1 9.0  MG  --   --  1.7  PHOS  --   --  2.9   GFR: Estimated Creatinine Clearance: 95.1 mL/min (by C-G formula based on SCr of 1.13 mg/dL). Liver Function Tests: Recent Labs  Lab 04/25/20 0203 04/26/20 0240  AST 23 26  ALT 31 34  ALKPHOS 81 78  BILITOT 0.9 0.7  PROT 6.1* 5.8*  ALBUMIN 2.9* 2.8*   No results for input(s): LIPASE, AMYLASE in the last 168 hours. No results for input(s): AMMONIA in the last 168 hours. Coagulation Profile: No results for input(s): INR, PROTIME in the last 168 hours. Cardiac  Enzymes: No results for input(s): CKTOTAL, CKMB, CKMBINDEX, TROPONINI in the last 168 hours. BNP (last 3 results) No results for input(s): PROBNP in the last 8760 hours. HbA1C: No results for input(s): HGBA1C in the last 72 hours. CBG: No results for input(s): GLUCAP in the last 168 hours. Lipid Profile: No results for input(s): CHOL, HDL, LDLCALC, TRIG, CHOLHDL, LDLDIRECT in the last 72 hours. Thyroid Function Tests: No results for input(s): TSH, T4TOTAL, FREET4, T3FREE, THYROIDAB in the last 72 hours. Anemia Panel: No results for input(s): VITAMINB12, FOLATE, FERRITIN, TIBC, IRON, RETICCTPCT in the last 72 hours. Sepsis Labs: No results for input(s): PROCALCITON, LATICACIDVEN in the last 168 hours.  Recent Results (from the past 240 hour(s))  Respiratory Panel by RT PCR (Flu A&B, Covid) - Nasopharyngeal Swab     Status: None   Collection Time: 04/24/20  4:20 PM   Specimen: Nasopharyngeal Swab  Result Value Ref Range Status   SARS Coronavirus 2 by RT PCR NEGATIVE NEGATIVE Final    Comment: (NOTE) SARS-CoV-2 target nucleic acids are NOT DETECTED.  The SARS-CoV-2 RNA is generally detectable in upper respiratoy specimens during the acute phase of infection. The lowest concentration of SARS-CoV-2 viral copies this assay can detect is 131 copies/mL. A negative result does not preclude SARS-Cov-2 infection and should not be used as the sole basis for treatment or other patient management decisions. A negative result may occur with  improper specimen collection/handling, submission of specimen other than nasopharyngeal swab, presence of viral mutation(s) within the areas targeted by this assay, and inadequate number of viral copies (<131 copies/mL). A negative result must be combined with clinical observations, patient history, and epidemiological information. The expected result is Negative.  Fact Sheet for Patients:  PinkCheek.be  Fact Sheet for  Healthcare Providers:  GravelBags.it  This test is no t yet approved or cleared by the [place] FDA and  has been authorized for detection and/or diagnosis of SARS-CoV-2 by FDA under an Emergency Use Authorization (EUA). This EUA will remain  in effect (meaning this test can be used) for the duration of the COVID-19 declaration under Section 564(b)(1) of the Act, 21 U.S.C. section 360bbb-3(b)(1), unless the authorization is terminated or revoked sooner.     Influenza A by PCR NEGATIVE NEGATIVE Final   Influenza B by PCR NEGATIVE NEGATIVE Final    Comment: (NOTE) The Xpert Xpress SARS-CoV-2/FLU/RSV assay is intended as an aid in  the diagnosis of influenza from Nasopharyngeal swab specimens and  should not be used as a sole basis for treatment. Nasal washings and  aspirates are unacceptable for Xpert Xpress SARS-CoV-2/FLU/RSV  testing.  Fact Sheet for Patients:  PinkCheek.be  Fact Sheet for Healthcare Providers: GravelBags.it  This test is not yet approved or cleared by the [place] FDA and  has been authorized for detection and/or diagnosis of SARS-CoV-2 by  FDA under an Emergency Use Authorization (EUA). This EUA will remain  in effect (meaning this test can be used) for the duration of the  Covid-19 declaration under Section 564(b)(1) of the Act, 21  U.S.C. section 360bbb-3(b)(1), unless the authorization is  terminated or revoked. Performed at [facility], [place] [address], [place], [place] [address]   Gram stain     Status: None   Collection Time: 04/25/20  9:25 AM   Specimen: Lung, Left; Pleural Fluid  Result Value Ref Range Status   Specimen Description FLUID  Final   Special Requests LUNG  Final   Gram Stain   Final    WBC PRESENT,BOTH PMN AND MONONUCLEAR NO ORGANISMS SEEN CYTOSPIN SMEAR Performed at [facility], [address] [place], [place] [address]     Report Status 04/25/2020 FINAL  Final  Acid Fast Smear (AFB)     Status: None   Collection Time: 04/25/20  9:25 AM   Specimen: Lung, Left; Pleural Fluid  Result Value Ref Range Status   AFB Specimen Processing Concentration  Final   Acid Fast Smear Negative  Final    Comment: (NOTE) Performed At: [facility] [place], [place] [id number] Rush Farmer MD HU:[id number]    Source (AFB) FLUID  Final    Comment: Performed at [facility], [place] [address], [place], [place] [address]         Radiology Studies: DG Chest 1 View  Result Date: 04/25/2020 CLINICAL DATA:  Post video bronchoscopy. EXAM: CHEST  1 VIEW COMPARISON:  Chest x-ray dated 04/25/2020 FINDINGS: There is some improved aeration in the left zone however, there is persistent diffuse opacification of the left hemithorax. The cardiac silhouette is difficult to fully evaluate on this study but appears similar to prior study. A large mediastinal mass is again noted. There is no pneumothorax. There is a persistent at least moderate-sized left-sided pleural effusion. IMPRESSION: 1. No pneumothorax. 2. Persistent diffuse opacification of the left hemithorax. 3. Persistent large mediastinal mass. 4. Persistent at least moderate-sized left-sided pleural effusion. Electronically Signed   By: Constance Holster M.D.   On: 04/25/2020 16:58   CT ABDOMEN PELVIS W CONTRAST  Result Date: 04/26/2020 CLINICAL DATA:  Newly diagnosed non-small cell lung carcinoma. Staging. EXAM: CT ABDOMEN AND PELVIS WITH CONTRAST TECHNIQUE: Multidetector CT imaging of the abdomen and pelvis was performed using the standard protocol following bolus administration of intravenous contrast. CONTRAST:  126mL OMNIPAQUE IOHEXOL 300 MG/ML  SOLN COMPARISON:  Chest CT on 04/24/2020 FINDINGS: Lower Chest: Large anterior mediastinal mass and small bilateral pleural effusions are again seen, as better visualized on recent chest CT. Hepatobiliary: A 2  cm hypovascular lesion is seen in the posterior right hepatic lobe, which has nonspecific features. No other liver masses are identified. Gallbladder is unremarkable. No evidence of biliary ductal dilatation. Pancreas:  No mass or inflammatory changes. Spleen: Within normal limits in size and appearance. Adrenals/Urinary Tract: No masses identified. No evidence of ureteral calculi or hydronephrosis. Stomach/Bowel: No evidence of obstruction, inflammatory process or abnormal fluid collections. Vascular/Lymphatic: No pathologically enlarged lymph nodes. No abdominal aortic aneurysm. Reproductive:  No mass or other significant abnormality. Other:  None. Musculoskeletal:  No suspicious bone lesions identified. IMPRESSION: 2 cm nonspecific hypovascular lesion  in posterior right hepatic lobe. Recommend abdomen MRI without and with contrast for further characterization. No other sites of metastatic disease identified within the abdomen or pelvis. Large anterior mediastinal mass and bilateral pleural effusions, as better demonstrated on recent chest CT. Electronically Signed   By: Marlaine Hind M.D.   On: 04/26/2020 14:15   DG Chest Port 1 View  Result Date: 04/26/2020 CLINICAL DATA:  Status post thoracentesis. EXAM: PORTABLE CHEST 1 VIEW COMPARISON:  Radiograph yesterday.  Chest CT 04/24/2020 FINDINGS: Decreased size of left pleural effusion after thoracentesis with increasing aeration decreasing hazy opacity. There is persistent blunting of the medial and lateral costophrenic angles and loculated fluid tracking at the apex. No definite pneumothorax. Slight residual rightward tracheal deviation. Heart size and mediastinal contours are obscured, however well-defined density in the left hemithorax likely corresponds to anterior mediastinal mass on chest CT. The right lung is clear. IMPRESSION: 1. Decreased size of left pleural effusion after thoracentesis with increasing aeration in the left hemithorax and decreasing hazy  opacity. No definite pneumothorax. 2. Small volume of persistent partially loculated left pleural fluid. Electronically Signed   By: Keith Rake M.D.   On: 04/26/2020 15:38   MR ABDOMEN MRCP W WO CONTAST  Result Date: 04/27/2020 CLINICAL DATA:  Non-small cell lung carcinoma. Indeterminate liver lesion on recent CT. Staging. EXAM: MRI ABDOMEN WITHOUT AND WITH CONTRAST (INCLUDING MRCP) TECHNIQUE: Multiplanar multisequence MR imaging of the abdomen was performed both before and after the administration of intravenous contrast. Heavily T2-weighted images of the biliary and pancreatic ducts were obtained, and three-dimensional MRCP images were rendered by post processing. CONTRAST:  98mL GADAVIST GADOBUTROL 1 MMOL/ML IV SOLN COMPARISON:  CT on 04/26/2020 FINDINGS: Lower chest: Large anterior mediastinal mass and malignant left pleural effusion with numerous pleural based soft tissue masses, as better demonstrated on recent chest CT. Hepatobiliary: Image degradation by motion artifact noted. A solitary mildly complex cystic lesion is seen in the posterior right hepatic lobe which measures 1.6 cm. This shows a mildly lobulated contour and a few peripheral septations, but does not have a solid or enhancing component, and does not have typical features of a metastasis. No other liver lesions are identified. Gallbladder is unremarkable. No evidence of biliary ductal dilatation. Pancreas:  No mass or inflammatory changes. Spleen:  Within normal limits in size and appearance. Adrenals/Urinary Tract: No adrenal or renal masses identified. No evidence of hydronephrosis. Stomach/Bowel: Visualized portion unremarkable. Vascular/Lymphatic: No pathologically enlarged lymph nodes identified. No abdominal aortic aneurysm. Other:  None. Musculoskeletal:  No suspicious bone lesions identified. IMPRESSION: 1.6 cm complex cyst in the posterior right hepatic lobe. No evidence of hepatic or other abdominal metastatic disease. Large  anterior mediastinal mass and malignant left pleural effusion , as better demonstrated on recent chest CT. Electronically Signed   By: Marlaine Hind M.D.   On: 04/27/2020 08:26   ECHOCARDIOGRAM COMPLETE  Result Date: 04/27/2020    ECHOCARDIOGRAM REPORT   Patient Name:   Travis Duran Date of Exam: 04/27/2020 Medical Rec #:  [id number]    Height:       69.0 in Accession #:    [id number]   Weight:       210.3 lb Date of Birth:  03/02/1976     BSA:          2.111 m Patient Age:    21 years     BP:           117/80 mmHg Patient Gender: M  HR:           105 bpm. Exam Location:  Inpatient Procedure: 2D Echo, Color Doppler, Cardiac Doppler and Strain Analysis Indications:    Pre-Chemo Evaluation  History:        Patient has no prior history of Echocardiogram examinations.                 Risk Factors:Sleep Apnea. Mediastinal mass.  Sonographer:    Raquel Sarna Senior RDCS Referring Phys: [place] Comments: Technically challenging due to shadowing from mass and shifted heart position in chest. IMPRESSIONS  1. Left ventricular ejection fraction, by estimation, is 60 to 65%. The left ventricle has normal function. The left ventricle has no regional wall motion abnormalities. Left ventricular diastolic parameters are consistent with Grade I diastolic dysfunction (impaired relaxation). The average left ventricular global longitudinal strain is -17.3 %. The global longitudinal strain is normal.  2. Right ventricular systolic function is normal. The right ventricular size is normal.  3. The mitral valve is normal in structure. No evidence of mitral valve regurgitation. No evidence of mitral stenosis.  4. The aortic valve is tricuspid. Aortic valve regurgitation is not visualized. No aortic stenosis is present.  5. The inferior vena cava is dilated in size with <50% respiratory variability, suggesting right atrial pressure of 15 mmHg. FINDINGS  Left Ventricle: Left ventricular ejection fraction, by  estimation, is 60 to 65%. The left ventricle has normal function. The left ventricle has no regional wall motion abnormalities. The average left ventricular global longitudinal strain is -17.3 %. The global longitudinal strain is normal. The left ventricular internal cavity size was normal in size. There is no left ventricular hypertrophy. Left ventricular diastolic parameters are consistent with Grade I diastolic dysfunction (impaired relaxation). Right Ventricle: The right ventricular size is normal. No increase in right ventricular wall thickness. Right ventricular systolic function is normal. Left Atrium: Left atrial size was normal in size. Right Atrium: Right atrial size was normal in size. Pericardium: There is no evidence of pericardial effusion. Mitral Valve: The mitral valve is normal in structure. No evidence of mitral valve regurgitation. No evidence of mitral valve stenosis. Tricuspid Valve: The tricuspid valve is normal in structure. Tricuspid valve regurgitation is not demonstrated. No evidence of tricuspid stenosis. Aortic Valve: The aortic valve is tricuspid. Aortic valve regurgitation is not visualized. No aortic stenosis is present. Pulmonic Valve: The pulmonic valve was normal in structure. Pulmonic valve regurgitation is not visualized. No evidence of pulmonic stenosis. Aorta: The aortic root is normal in size and structure. Venous: The inferior vena cava is dilated in size with less than 50% respiratory variability, suggesting right atrial pressure of 15 mmHg. IAS/Shunts: No atrial level shunt detected by color flow Doppler. Additional Comments: Mass noted anterior to the right ventricle and apex.  LEFT VENTRICLE PLAX 2D LVOT diam:     2.20 cm LV SV:         65       2D Longitudinal Strain LV SV Index:   31       2D Strain GLS (A2C):   -15.8 % LVOT Area:     3.80 cm 2D Strain GLS (A3C):   -18.4 %                         2D Strain GLS (A4C):   -17.8 %  2D Strain GLS Avg:      -17.3 % RIGHT VENTRICLE RV S prime:     10.00 cm/s TAPSE (M-mode): 1.5 cm LEFT ATRIUM             Index       RIGHT ATRIUM           Index LA Vol (A2C):   25.9 ml 12.27 ml/m RA Area:     13.40 cm LA Vol (A4C):   17.6 ml 8.34 ml/m  RA Volume:   32.00 ml  15.16 ml/m LA Biplane Vol: 21.9 ml 10.38 ml/m  AORTIC VALVE LVOT Vmax:   97.30 cm/s LVOT Vmean:  74.600 cm/s LVOT VTI:    0.170 m  AORTA Ao Root diam: 3.30 cm Ao Asc diam:  3.30 cm  SHUNTS Systemic VTI:  0.17 m Systemic Diam: 2.20 cm Skeet Latch MD Electronically signed by Skeet Latch MD Signature Date/Time: 04/27/2020/12:40:00 PM    Final         Scheduled Meds: . heparin  5,000 Units Subcutaneous Q8H   Continuous Infusions:   LOS: 3 days   Time spent= 20 mins    Jeannemarie Sawaya Arsenio Loader, MD [facility]  If 7PM-7AM, please contact night-coverage  04/27/2020, 1:55 PM

## 2020-04-28 ENCOUNTER — Inpatient Hospital Stay (HOSPITAL_COMMUNITY): Payer: BC Managed Care – PPO

## 2020-04-28 ENCOUNTER — Encounter (HOSPITAL_COMMUNITY): Payer: Self-pay | Admitting: Internal Medicine

## 2020-04-28 ENCOUNTER — Other Ambulatory Visit: Payer: Self-pay

## 2020-04-28 ENCOUNTER — Inpatient Hospital Stay: Payer: BC Managed Care – PPO

## 2020-04-28 ENCOUNTER — Telehealth: Payer: Self-pay

## 2020-04-28 DIAGNOSIS — J90 Pleural effusion, not elsewhere classified: Secondary | ICD-10-CM

## 2020-04-28 LAB — COMPREHENSIVE METABOLIC PANEL
ALT: 48 U/L — ABNORMAL HIGH (ref 0–44)
AST: 33 U/L (ref 15–41)
Albumin: 2.6 g/dL — ABNORMAL LOW (ref 3.5–5.0)
Alkaline Phosphatase: 75 U/L (ref 38–126)
Anion gap: 12 (ref 5–15)
BUN: 11 mg/dL (ref 6–20)
CO2: 20 mmol/L — ABNORMAL LOW (ref 22–32)
Calcium: 8.7 mg/dL — ABNORMAL LOW (ref 8.9–10.3)
Chloride: 105 mmol/L (ref 98–111)
Creatinine, Ser: 1.19 mg/dL (ref 0.61–1.24)
GFR calc Af Amer: >60 mL/min (ref >60)
GFR calc non Af Amer: >60 mL/min (ref >60)
Glucose, Bld: 108 mg/dL — ABNORMAL HIGH (ref 70–99)
Potassium: 3.8 mmol/L (ref 3.5–5.1)
Sodium: 137 mmol/L (ref 135–145)
Total Bilirubin: 0.5 mg/dL (ref 0.3–1.2)
Total Protein: 5.5 g/dL — ABNORMAL LOW (ref 6.5–8.1)

## 2020-04-28 LAB — LACTATE DEHYDROGENASE: LDH: 616 U/L — ABNORMAL HIGH (ref 98–192)

## 2020-04-28 LAB — MAGNESIUM: Magnesium: 1.7 mg/dL (ref 1.7–2.4)

## 2020-04-28 MED ORDER — HEPARIN SODIUM (PORCINE) 5000 UNIT/ML IJ SOLN: 5000.0000 [IU] | Freq: Three times a day (TID) | INTRAMUSCULAR | Status: DC

## 2020-04-28 MED ORDER — MIDAZOLAM HCL 2 MG/2ML IJ SOLN
INTRAMUSCULAR | Status: AC
  Filled 2020-04-28: qty 4

## 2020-04-28 MED ORDER — MIDAZOLAM HCL 2 MG/2ML IJ SOLN
INTRAMUSCULAR | Status: AC | PRN
  Administered 2020-04-28: 0.5 mg via INTRAVENOUS
  Administered 2020-04-28: 1.5 mg via INTRAVENOUS

## 2020-04-28 MED ORDER — LIDOCAINE HCL 1 % IJ SOLN
INTRAMUSCULAR | Status: AC
  Filled 2020-04-28: qty 20

## 2020-04-28 MED ORDER — GELATIN ABSORBABLE 12-7 MM EX MISC
CUTANEOUS | Status: AC
  Filled 2020-04-28: qty 1

## 2020-04-28 MED ORDER — FENTANYL CITRATE (PF) 100 MCG/2ML IJ SOLN
INTRAMUSCULAR | Status: DC
Start: 2020-04-28 — End: 2020-04-29
  Filled 2020-04-28: qty 4

## 2020-04-28 MED ORDER — FENTANYL CITRATE (PF) 100 MCG/2ML IJ SOLN
INTRAMUSCULAR | Status: AC | PRN
  Administered 2020-04-28 (×2): 25 ug via INTRAVENOUS

## 2020-04-28 NOTE — Telephone Encounter (Signed)
Called and spoke with patient he is now scheduled with Aaron Edelman on 05/08/20 at 3:30. Nothing further needed at this time.

## 2020-04-28 NOTE — Addendum Note (Signed)
Addended by: Lia Foyer R on: 04/28/2020 09:09 AM   Modules accepted: Orders

## 2020-04-28 NOTE — Plan of Care (Signed)

## 2020-04-28 NOTE — Procedures (Signed)
Interventional Radiology Procedure Note  Procedure: CT guided biopsy of mediastinal mass  Findings: Please refer to procedural dictation for full description.  18 ga core x5 obtained of anterior mediastinal mass.  Complications: None immediate  Estimated Blood Loss: <10 mL  Recommendations: Follow up Pathology results   Ruthann Cancer, MD

## 2020-04-28 NOTE — Progress Notes (Signed)
Patient waiting to eat and waiting for groggy feeling to wear off post procedure. Wife at the bedside. Discharge paperwork finished and printed.

## 2020-04-28 NOTE — Discharge Summary (Signed)
Physician Discharge Summary  Mahari Strahm OIB:[id number] DOB: 15-Mar-1976 DOA: 04/24/2020  PCP: Laurey Morale, MD  Admit date: 04/24/2020 Discharge date: 04/30/2020  Admitted From: Home Disposition: Home  Recommendations for Outpatient Follow-up:  Follow up with PCP in 1-2 weeks Please obtain BMP/CBC in one week your next doctors visit.  Follow-up patient with Dr. Marin Olp  Home Health: Equipment/Devices: Discharge Condition: Stable CODE STATUS:  Diet recommendation:   Brief/Interim Summary:  44 years old male with medical history significant for osteoarthritis, Erectile dysfunction, obstructive sleep apnea  who developed exertional shortness of breath, and cough for 6 weeks.  He was treated with amoxicillin, Covid test was negative,  PCP has ordered CT chest found to have left upper lobe mass with significant pleural effusion.  Patient was sent to the emergency department. Patient is admitted for left lung mass associated with large pleural effusion.  Patient underwent thoracocentesis x 3, tolerated well.  Pulmonology consulted,  patient underwent bronchoscopy and biopsy was obtained. CT Abdomen showed lesion in the liver, MRCP ordered. MRCP showed 1.6 cm complex posterior right hepatic cyst and large anterior mediastinal mass.  Patient started doing well medically.  After discussion between oncology and radiology patient underwent biopsy of his anterior mass thereafter remained medically stable and was discharged.  Patient was to follow-up outpatient with Dr. Marin Olp regarding biopsy results and further follow-up care.  Patient and his wife were made aware of his clinical care.  All the questions were answered.     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  Seen by pulmonary status post thoracentesis.  Cytology results from the fluid analysis is pending At the time of discharge -Oncology recommended CT-guided biopsy.  Case discussed between oncology and IR, patient had biopsy performed prior to his discharge.  These results were pending and were to be followed up outpatient oncology -MRCP-1.6 cm complex cyst in the posterior right hepatic lobe and large anterior mediastinal mass with malignant pleural effusion   Obstructive sleep apnea: Continue CPAP if needed in the hospital.   Insomnia As needed trazodone      Body mass index is 31.06 kg/m.         Discharge Diagnoses:  Principal Problem:   Mass of left lung Active Problems:   OBSTRUCTIVE SLEEP APNEA   Pleural effusion   History of thoracentesis      Consultations: IR Oncology  Subjective: Feels great no complaints  Discharge Exam: Vitals:   04/28/20 1540 04/28/20 1550  BP: 105/86   Pulse: (!) 106 (!) 104  Resp: (!) 22 20  Temp:    SpO2: 96% 96%   Vitals:   04/28/20 1520 04/28/20 1525 04/28/20 1540 04/28/20 1550  BP: 108/80 103/79 105/86   Pulse: (!) 107 (!) 107 (!) 106 (!) 104  Resp: 19 20 (!) 22 20  Temp:      TempSrc:      SpO2: 97% 96% 96% 96%  Weight:      Height:        General: Pt is alert, awake, not in acute distress Cardiovascular: RRR, S1/S2 +, no rubs, no gallops Respiratory: CTA bilaterally, no wheezing, no rhonchi Abdominal: Soft, NT, ND, bowel sounds + Extremities: no edema, no cyanosis  Discharge Instructions  Allergies as of 04/28/2020   No Known Allergies      Medication List     STOP taking these medications    amoxicillin-clavulanate 875-125 MG tablet Commonly known as: AUGMENTIN   benzonatate 200 MG capsule Commonly known as: TESSALON   promethazine-codeine 6.25-10 MG/5ML syrup Commonly known as: PHENERGAN with CODEINE   sildenafil 20 MG tablet Commonly known as:  REVATIO       TAKE these medications    Flovent HFA 110 MCG/ACT inhaler Generic drug: fluticasone Inhale 2 puffs into the lungs in the morning and at bedtime.   tadalafil 10 MG tablet Commonly known as: Cialis Take 1 tablet (10 mg total) by mouth daily as needed for erectile dysfunction.        Follow-up Information     Lauraine Rinne, NP Follow up on 05/08/2020.   Specialty: Pulmonary Disease Why: Come to office at 2:30 PM, get chest X-ray prior to seeing Aaron Edelman. Contact information: [place] [address] [phone]         Volanda Napoleon, MD. Call in 1 day(s).   Specialty: Oncology Contact information: [address] [place] [address] [phone]         Laurey Morale, MD. Schedule an appointment as soon as possible for a visit in 1 week(s).   Specialty: Family Medicine Contact information: [place] [address] [phone]                No Known Allergies  You were cared for by a hospitalist during your hospital stay. If you have any questions about your discharge medications or the care you received while you were in the hospital after you are discharged, you can call the unit and asked to speak with the hospitalist on call if the hospitalist that took care of you is not available. Once you are discharged, your primary care physician will handle any further medical issues. Please note that no refills for any discharge medications will be authorized once you are discharged, as it is imperative that you return to your primary care physician (or establish a relationship with a primary care physician if you do not have one) for your aftercare needs so that they can reassess your need for medications and monitor your lab values.   Procedures/Studies: DG Chest 1 View  Result Date: 04/25/2020 CLINICAL DATA:  Post video bronchoscopy. EXAM: CHEST  1 VIEW COMPARISON:  Chest x-ray dated  04/25/2020 FINDINGS: There is some improved aeration in the left zone however, there is persistent diffuse opacification of the left hemithorax. The cardiac silhouette is difficult to fully evaluate on this study but appears similar to prior study. A large mediastinal mass is again noted. There is no pneumothorax. There is a persistent at least moderate-sized left-sided pleural effusion. IMPRESSION: 1. No pneumothorax. 2. Persistent diffuse opacification of the left hemithorax. 3. Persistent large mediastinal mass. 4. Persistent at least moderate-sized left-sided pleural effusion. Electronically Signed   By: Constance Holster M.D.   On: 04/25/2020 16:58   DG Chest 1 View  Result Date: 04/25/2020 CLINICAL DATA:  Shortness of breath.  Status post thoracentesis EXAM: CHEST  1 VIEW COMPARISON:  Chest CT April 24, 2020. FINDINGS: No pneumothorax. Large partially loculated pleural effusion remains on the left. Opacity more medially on the left may well represent a large mass. A degree of atelectasis and consolidation in association with apparent mass cannot be excluded. Smaller  pleural effusions seen on the right on chest CT from 1 day prior is not appreciable. There is mild atelectasis in the medial right base. Right lung otherwise is clear. Heart appears mildly enlarged. There is no longer appreciable impression on the heart as was noted on CT from 1 day prior. Pulmonary vascularity appears grossly normal. No adenopathy. No bone lesions. IMPRESSION: Persistent sizable pleural effusion on the left although smaller than on CT examination from 1 day prior. The heart no longer appears significantly compressed as was noted on CT from 1 day prior. Mass seen on CT medial to the effusion persists. There may be associated atelectasis and consolidation in this area. Right lung appears clear except for atelectasis in the medial right base. Heart appears mildly enlarged. Electronically Signed   By: Lowella Grip III  M.D.   On: 04/25/2020 09:53   DG Chest 2 View  Result Date: 04/04/2020 CLINICAL DATA:  Chronic cough EXAM: CHEST - 2 VIEW COMPARISON:  None. FINDINGS: Frontal and lateral views of the chest demonstrate an unremarkable cardiac silhouette. Increased density within the lower 2/3 of the left hemithorax compatible with consolidation and effusion. Right chest is clear. No pneumothorax. No acute bony abnormalities. IMPRESSION: 1. Increased density left hemithorax consistent with left lung consolidation and effusion. If the patient has clinical signs and symptoms of pneumonia, Followup PA and lateral chest X-ray is recommended in 3-4 weeks following trial of antibiotic therapy to ensure resolution and exclude underlying malignancy. Otherwise, CT chest with IV contrast may be useful. These results will be called to the ordering clinician or representative by the Radiologist Assistant, and communication documented in the PACS or [employer]. Electronically Signed   By: Randa Ngo M.D.   On: 04/04/2020 03:51   CT Chest W Contrast  Result Date: 04/24/2020 CLINICAL DATA:  Left pleural effusion. Dyspnea with exertion for 1 month. Chronic cough. EXAM: CT CHEST WITH CONTRAST TECHNIQUE: Multidetector CT imaging of the chest was performed during intravenous contrast administration. CONTRAST:  15mL ISOVUE-300 IOPAMIDOL (ISOVUE-300) INJECTION 61% COMPARISON:  04/03/2020 chest radiograph. FINDINGS: Cardiovascular: Normal heart size. Small pericardial effusion with suggestion of mild pericardial thickening and enhancement. Great vessels are normal in course and caliber. No central pulmonary emboli. Mediastinum/Nodes: Mildly heterogeneous thyroid gland without discrete thyroid nodules. Unremarkable esophagus. No axillary adenopathy. Large heterogeneously enhancing poorly marginated solid 14.6 x 11.7 x 18.5 cm left anterior mediastinal mass (series 2/image 62) with prominent mass-effect on the right shifted great vessels  and heart, extending superiorly nearly to the thoracic inlet and inferiorly to the left pericardiophrenic region, contiguous with the left hilum and potentially directly invading the left upper lung. No discrete hilar adenopathy. Lungs/Pleura: No pneumothorax. Small dependent right pleural effusion. Large left pleural effusion. Widespread enhancing left pleural nodularity, for example measuring 2.6 cm at the anterior left apex (series 2/image 33) and 2.8 cm posteriorly and inferiorly in the left pleural space (series 2/image 131). Complete left lower lobe and near complete left upper lobe atelectasis. No additional significant pulmonary nodules. Upper abdomen: No acute abnormality. Musculoskeletal:  No aggressive appearing focal osseous lesions. IMPRESSION: 1. 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 Differential considerations include primary thymic malignancy, primary bronchogenic carcinoma  or less likely lymphoma. 2. Widespread enhancing left pleural nodularity compatible with pleural metastatic disease. 3. Large left pleural effusion with near complete left lung atelectasis. 4. Small dependent right pleural effusion. 5. Small pericardial effusion with suggestion of mild pericardial thickening and enhancement, cannot exclude malignant pericardial effusion. These results were called by telephone at the time of interpretation on 04/24/2020 at 2:41 pm to [place], RN in the office of provider Alysia Penna, who verbally acknowledged these results. The patient will be transported to the ED via ambulance from the imaging center for further evaluation. Electronically Signed    By: Ilona Sorrel M.D.   On: 04/24/2020 15:07   CT ABDOMEN PELVIS W CONTRAST  Result Date: 04/26/2020 CLINICAL DATA:  Newly diagnosed non-small cell lung carcinoma. Staging. EXAM: CT ABDOMEN AND PELVIS WITH CONTRAST TECHNIQUE: Multidetector CT imaging of the abdomen and pelvis was performed using the standard protocol following bolus administration of intravenous contrast. CONTRAST:  198mL OMNIPAQUE IOHEXOL 300 MG/ML  SOLN COMPARISON:  Chest CT on 04/24/2020 FINDINGS: Lower Chest: Large anterior mediastinal mass and small bilateral pleural effusions are again seen, as better visualized on recent chest CT. Hepatobiliary: A 2 cm hypovascular lesion is seen in the posterior right hepatic lobe, which has nonspecific features. No other liver masses are identified. Gallbladder is unremarkable. No evidence of biliary ductal dilatation. Pancreas:  No mass or inflammatory changes. Spleen: Within normal limits in size and appearance. Adrenals/Urinary Tract: No masses identified. No evidence of ureteral calculi or hydronephrosis. Stomach/Bowel: No evidence of obstruction, inflammatory process or abnormal fluid collections. Vascular/Lymphatic: No pathologically enlarged lymph nodes. No abdominal aortic aneurysm. Reproductive:  No mass or other significant abnormality. Other:  None. Musculoskeletal:  No suspicious bone lesions identified. IMPRESSION: 2 cm nonspecific hypovascular lesion in posterior right hepatic lobe. Recommend abdomen MRI without and with contrast for further characterization. No other sites of metastatic disease identified within the abdomen or pelvis. Large anterior mediastinal mass and bilateral pleural effusions, as better demonstrated on recent chest CT. Electronically Signed   By: Marlaine Hind M.D.   On: 04/26/2020 14:15   CT BIOPSY  Result Date: 04/28/2020 INDICATION: 44 year old male with large anterior mediastinal mass suspected lymphoma. Core biopsy requested for additional tissue for  diagnostic purposes. EXAM: CT BIOPSY COMPARISON:  None. MEDICATIONS: None. ANESTHESIA/SEDATION: Fentanyl 2 mcg IV; Versed 50 mg IV Sedation time: 26 minutes; The patient was continuously monitored during the procedure by the interventional radiology nurse under my direct supervision. CONTRAST:  None. COMPLICATIONS: None immediate. PROCEDURE: Informed consent was obtained from the patient following an explanation of the procedure, risks, benefits and alternatives. A time out was performed prior to the initiation of the procedure. The patient was positioned supine on the CT table and a limited CT was performed for procedural planning demonstrating similar appearance of large anterior mediastinal soft tissue mass. The procedure was planned. The operative site was prepped and draped in the usual sterile fashion. Appropriate trajectory was confirmed with a 22 gauge spinal needle after the adjacent tissues were anesthetized with 1% Lidocaine with epinephrine. Under intermittent CT guidance, a 17 gauge coaxial needle was advanced into the peripheral aspect of the mass. Appropriate positioning was confirmed and a total of 5 samples were obtained with an 18 gauge core needle biopsy device. The co-axial needle was removed while simultaneously injecting a Gel-Foam slurry and hemostasis was achieved with manual compression. A limited postprocedural CT was negative for hemorrhage or additional complication. A dressing was placed. The patient  tolerated the procedure well without immediate postprocedural complication. IMPRESSION: Technically successful CT guided core needle biopsy of anterior mediastinal mass. Electronically Signed   By: Ruthann Cancer MD   On: 04/28/2020 16:38   DG Chest Port 1 View  Result Date: 04/26/2020 CLINICAL DATA:  Status post thoracentesis. EXAM: PORTABLE CHEST 1 VIEW COMPARISON:  Radiograph yesterday.  Chest CT 04/24/2020 FINDINGS: Decreased size of left pleural effusion after thoracentesis with  increasing aeration decreasing hazy opacity. There is persistent blunting of the medial and lateral costophrenic angles and loculated fluid tracking at the apex. No definite pneumothorax. Slight residual rightward tracheal deviation. Heart size and mediastinal contours are obscured, however well-defined density in the left hemithorax likely corresponds to anterior mediastinal mass on chest CT. The right lung is clear. IMPRESSION: 1. Decreased size of left pleural effusion after thoracentesis with increasing aeration in the left hemithorax and decreasing hazy opacity. No definite pneumothorax. 2. Small volume of persistent partially loculated left pleural fluid. Electronically Signed   By: Keith Rake M.D.   On: 04/26/2020 15:38   MR ABDOMEN MRCP W WO CONTAST  Result Date: 04/27/2020 CLINICAL DATA:  Non-small cell lung carcinoma. Indeterminate liver lesion on recent CT. Staging. EXAM: MRI ABDOMEN WITHOUT AND WITH CONTRAST (INCLUDING MRCP) TECHNIQUE: Multiplanar multisequence MR imaging of the abdomen was performed both before and after the administration of intravenous contrast. Heavily T2-weighted images of the biliary and pancreatic ducts were obtained, and three-dimensional MRCP images were rendered by post processing. CONTRAST:  46mL GADAVIST GADOBUTROL 1 MMOL/ML IV SOLN COMPARISON:  CT on 04/26/2020 FINDINGS: Lower chest: Large anterior mediastinal mass and malignant left pleural effusion with numerous pleural based soft tissue masses, as better demonstrated on recent chest CT. Hepatobiliary: Image degradation by motion artifact noted. A solitary mildly complex cystic lesion is seen in the posterior right hepatic lobe which measures 1.6 cm. This shows a mildly lobulated contour and a few peripheral septations, but does not have a solid or enhancing component, and does not have typical features of a metastasis. No other liver lesions are identified. Gallbladder is unremarkable. No evidence of biliary  ductal dilatation. Pancreas:  No mass or inflammatory changes. Spleen:  Within normal limits in size and appearance. Adrenals/Urinary Tract: No adrenal or renal masses identified. No evidence of hydronephrosis. Stomach/Bowel: Visualized portion unremarkable. Vascular/Lymphatic: No pathologically enlarged lymph nodes identified. No abdominal aortic aneurysm. Other:  None. Musculoskeletal:  No suspicious bone lesions identified. IMPRESSION: 1.6 cm complex cyst in the posterior right hepatic lobe. No evidence of hepatic or other abdominal metastatic disease. Large anterior mediastinal mass and malignant left pleural effusion , as better demonstrated on recent chest CT. Electronically Signed   By: Marlaine Hind M.D.   On: 04/27/2020 08:26   ECHOCARDIOGRAM COMPLETE  Result Date: 04/27/2020    ECHOCARDIOGRAM REPORT   Patient Name:   ARGUS CARAHER Date of Exam: 04/27/2020 Medical Rec #:  [id number]    Height:       69.0 in Accession #:    [id number]   Weight:       210.3 lb Date of Birth:  1976-01-23     BSA:          2.111 m Patient Age:    44 years     BP:           117/80 mmHg Patient Gender: M            HR:           105  bpm. Exam Location:  Inpatient Procedure: 2D Echo, Color Doppler, Cardiac Doppler and Strain Analysis Indications:    Pre-Chemo Evaluation  History:        Patient has no prior history of Echocardiogram examinations.                 Risk Factors:Sleep Apnea. Mediastinal mass.  Sonographer:    Raquel Sarna Senior RDCS Referring Phys: [place] Comments: Technically challenging due to shadowing from mass and shifted heart position in chest. IMPRESSIONS  1. Left ventricular ejection fraction, by estimation, is 60 to 65%. The left ventricle has normal function. The left ventricle has no regional wall motion abnormalities. Left ventricular diastolic parameters are consistent with Grade I diastolic dysfunction (impaired relaxation). The average left ventricular global longitudinal strain is  -17.3 %. The global longitudinal strain is normal.  2. Right ventricular systolic function is normal. The right ventricular size is normal.  3. The mitral valve is normal in structure. No evidence of mitral valve regurgitation. No evidence of mitral stenosis.  4. The aortic valve is tricuspid. Aortic valve regurgitation is not visualized. No aortic stenosis is present.  5. The inferior vena cava is dilated in size with <50% respiratory variability, suggesting right atrial pressure of 15 mmHg. FINDINGS  Left Ventricle: Left ventricular ejection fraction, by estimation, is 60 to 65%. The left ventricle has normal function. The left ventricle has no regional wall motion abnormalities. The average left ventricular global longitudinal strain is -17.3 %. The global longitudinal strain is normal. The left ventricular internal cavity size was normal in size. There is no left ventricular hypertrophy. Left ventricular diastolic parameters are consistent with Grade I diastolic dysfunction (impaired relaxation). Right Ventricle: The right ventricular size is normal. No increase in right ventricular wall thickness. Right ventricular systolic function is normal. Left Atrium: Left atrial size was normal in size. Right Atrium: Right atrial size was normal in size. Pericardium: There is no evidence of pericardial effusion. Mitral Valve: The mitral valve is normal in structure. No evidence of mitral valve regurgitation. No evidence of mitral valve stenosis. Tricuspid Valve: The tricuspid valve is normal in structure. Tricuspid valve regurgitation is not demonstrated. No evidence of tricuspid stenosis. Aortic Valve: The aortic valve is tricuspid. Aortic valve regurgitation is not visualized. No aortic stenosis is present. Pulmonic Valve: The pulmonic valve was normal in structure. Pulmonic valve regurgitation is not visualized. No evidence of pulmonic stenosis. Aorta: The aortic root is normal in size and structure. Venous: The  inferior vena cava is dilated in size with less than 50% respiratory variability, suggesting right atrial pressure of 15 mmHg. IAS/Shunts: No atrial level shunt detected by color flow Doppler. Additional Comments: Mass noted anterior to the right ventricle and apex.  LEFT VENTRICLE PLAX 2D LVOT diam:     2.20 cm LV SV:         65       2D Longitudinal Strain LV SV Index:   31       2D Strain GLS (A2C):   -15.8 % LVOT Area:     3.80 cm 2D Strain GLS (A3C):   -18.4 %                         2D Strain GLS (A4C):   -17.8 %                         2D  Strain GLS Avg:     -17.3 % RIGHT VENTRICLE RV S prime:     10.00 cm/s TAPSE (M-mode): 1.5 cm LEFT ATRIUM             Index       RIGHT ATRIUM           Index LA Vol (A2C):   25.9 ml 12.27 ml/m RA Area:     13.40 cm LA Vol (A4C):   17.6 ml 8.34 ml/m  RA Volume:   32.00 ml  15.16 ml/m LA Biplane Vol: 21.9 ml 10.38 ml/m  AORTIC VALVE LVOT Vmax:   97.30 cm/s LVOT Vmean:  74.600 cm/s LVOT VTI:    0.170 m  AORTA Ao Root diam: 3.30 cm Ao Asc diam:  3.30 cm  SHUNTS Systemic VTI:  0.17 m Systemic Diam: 2.20 cm Skeet Latch MD Electronically signed by Skeet Latch MD Signature Date/Time: 04/27/2020/12:40:00 PM    Final    IR THORACENTESIS ASP PLEURAL SPACE W/IMG GUIDE  Result Date: 04/25/2020 INDICATION: Patient with newly diagnosed lung mass with large left pleural effusion presents today for a therapeutic and diagnostic thoracentesis. EXAM: ULTRASOUND GUIDED THORACENTESIS MEDICATIONS: 1% lidocaine 10 COMPLICATIONS: None immediate. PROCEDURE: An ultrasound guided thoracentesis was thoroughly discussed with the patient and questions answered. The benefits, risks, alternatives and complications were also discussed. The patient understands and wishes to proceed with the procedure. Written consent was obtained. Ultrasound was performed to localize and mark an adequate pocket of fluid in the left chest. The area was then prepped and draped in the normal sterile fashion.  1% Lidocaine was used for local anesthesia. Under ultrasound guidance a 6 Fr Safe-T-Centesis catheter was introduced. Thoracentesis was performed. The catheter was removed and a dressing applied. FINDINGS: A total of approximately 1.8 L of dark yellow fluid was removed. Samples were sent to the laboratory as requested by the clinical team. IMPRESSION: Successful ultrasound guided left thoracentesis yielding 1.8 L of pleural fluid. Read by: Soyla Dryer, NP Electronically Signed   By: Jacqulynn Cadet M.D.   On: 04/25/2020 10:03     The results of significant diagnostics from this hospitalization (including imaging, microbiology, ancillary and laboratory) are listed below for reference.     Microbiology: Recent Results (from the past 240 hour(s))  Respiratory Panel by RT PCR (Flu A&B, Covid) - Nasopharyngeal Swab     Status: None   Collection Time: 04/24/20  4:20 PM   Specimen: Nasopharyngeal Swab  Result Value Ref Range Status   SARS Coronavirus 2 by RT PCR NEGATIVE NEGATIVE Final    Comment: (NOTE) SARS-CoV-2 target nucleic acids are NOT DETECTED.  The SARS-CoV-2 RNA is generally detectable in upper respiratoy specimens during the acute phase of infection. The lowest concentration of SARS-CoV-2 viral copies this assay can detect is 131 copies/mL. A negative result does not preclude SARS-Cov-2 infection and should not be used as the sole basis for treatment or other patient management decisions. A negative result may occur with  improper specimen collection/handling, submission of specimen other than nasopharyngeal swab, presence of viral mutation(s) within the areas targeted by this assay, and inadequate number of viral copies (<131 copies/mL). A negative result must be combined with clinical observations, patient history, and epidemiological information. The expected result is Negative.  Fact Sheet for Patients:  PinkCheek.be  Fact Sheet for  Healthcare Providers:  GravelBags.it  This test is no t yet approved or cleared by the [place] and  has been authorized for  detection and/or diagnosis of SARS-CoV-2 by FDA under an Emergency Use Authorization (EUA). This EUA will remain  in effect (meaning this test can be used) for the duration of the COVID-19 declaration under Section 564(b)(1) of the Act, 21 U.S.C. section 360bbb-3(b)(1), unless the authorization is terminated or revoked sooner.     Influenza A by PCR NEGATIVE NEGATIVE Final   Influenza B by PCR NEGATIVE NEGATIVE Final    Comment: (NOTE) The Xpert Xpress SARS-CoV-2/FLU/RSV assay is intended as an aid in  the diagnosis of influenza from Nasopharyngeal swab specimens and  should not be used as a sole basis for treatment. Nasal washings and  aspirates are unacceptable for Xpert Xpress SARS-CoV-2/FLU/RSV  testing.  Fact Sheet for Patients: PinkCheek.be  Fact Sheet for Healthcare Providers: GravelBags.it  This test is not yet approved or cleared by the [place] FDA and  has been authorized for detection and/or diagnosis of SARS-CoV-2 by  FDA under an Emergency Use Authorization (EUA). This EUA will remain  in effect (meaning this test can be used) for the duration of the  Covid-19 declaration under Section 564(b)(1) of the Act, 21  U.S.C. section 360bbb-3(b)(1), unless the authorization is  terminated or revoked. Performed at [facility], [place] [address], [place], [place] [address]   Gram stain     Status: None   Collection Time: 04/25/20  9:25 AM   Specimen: Lung, Left; Pleural Fluid  Result Value Ref Range Status   Specimen Description FLUID  Final   Special Requests LUNG  Final   Gram Stain   Final    WBC PRESENT,BOTH PMN AND MONONUCLEAR NO ORGANISMS SEEN CYTOSPIN SMEAR Performed at [facility], [address], [place], [place] [address]     Report Status 04/25/2020 FINAL  Final  Acid Fast Smear (AFB)     Status: None   Collection Time: 04/25/20  9:25 AM   Specimen: Lung, Left; Pleural Fluid  Result Value Ref Range Status   AFB Specimen Processing Concentration  Final   Acid Fast Smear Negative  Final    Comment: (NOTE) Performed At: [facility] [place], [place] [id number] Rush Farmer MD DZ:[id number]    Source (AFB) FLUID  Final    Comment: Performed at [facility], [place] [address], [place], [place] [address]     Labs: BNP (last 3 results) No results for input(s): BNP in the last 8760 hours. Basic Metabolic Panel: Recent Labs  Lab 04/24/20 1620 04/25/20 0203 04/26/20 0240 04/28/20 0149  NA 138 138 139 137  K 3.8 4.3 4.3 3.8  CL 104 104 106 105  CO2 23 24 22  20*  GLUCOSE 109* 101* 139* 108*  BUN 10 10 11 11   CREATININE 1.02 1.19 1.13 1.19  CALCIUM 9.1 9.1 9.0 8.7*  MG  --   --  1.7 1.7  PHOS  --   --  2.9  --    Liver Function Tests: Recent Labs  Lab 04/25/20 0203 04/26/20 0240 04/28/20 0149  AST 23 26 33  ALT 31 34 48*  ALKPHOS 81 78 75  BILITOT 0.9 0.7 0.5  PROT 6.1* 5.8* 5.5*  ALBUMIN 2.9* 2.8* 2.6*   No results for input(s): LIPASE, AMYLASE in the last 168 hours. No results for input(s): AMMONIA in the last 168 hours. CBC: Recent Labs  Lab 04/24/20 1620 04/25/20 0203 04/26/20 0240  WBC 9.6 9.2 8.7  NEUTROABS 7.6  --   --  HGB 13.6 12.2* 13.2  HCT 41.5 38.3* 40.6  MCV 88.5 88.2 87.1  PLT 461* 403* 416*   Cardiac Enzymes: No results for input(s): CKTOTAL, CKMB, CKMBINDEX, TROPONINI in the last 168 hours. BNP: Invalid input(s): POCBNP CBG: No results for input(s): GLUCAP in the last 168 hours. D-Dimer No results for input(s): DDIMER in the last 72 hours. Hgb A1c No results for input(s): HGBA1C in the last 72 hours. Lipid Profile No results for input(s): CHOL, HDL, LDLCALC, TRIG, CHOLHDL, LDLDIRECT in the last 72 hours. Thyroid function  studies No results for input(s): TSH, T4TOTAL, T3FREE, THYROIDAB in the last 72 hours.  Invalid input(s): FREET3 Anemia work up No results for input(s): VITAMINB12, FOLATE, FERRITIN, TIBC, IRON, RETICCTPCT in the last 72 hours. Urinalysis    Component Value Date/Time   COLORURINE LT. YELLOW 12/19/2009 0803   APPEARANCEUR CLEAR 12/19/2009 0803   LABSPEC 1.025 12/19/2009 0803   PHURINE 7.0 12/19/2009 0803   GLUCOSEU NEGATIVE 12/19/2009 0803   BILIRUBINUR N 04/06/2019 1425   KETONESUR NEGATIVE 12/19/2009 0803   PROTEINUR Negative 04/06/2019 1425   UROBILINOGEN 0.2 04/06/2019 1425   UROBILINOGEN 0.2 12/19/2009 0803   NITRITE N 04/06/2019 1425   NITRITE NEGATIVE 12/19/2009 0803   LEUKOCYTESUR Negative 04/06/2019 1425   Sepsis Labs Invalid input(s): PROCALCITONIN,  WBC,  LACTICIDVEN Microbiology Recent Results (from the past 240 hour(s))  Respiratory Panel by RT PCR (Flu A&B, Covid) - Nasopharyngeal Swab     Status: None   Collection Time: 04/24/20  4:20 PM   Specimen: Nasopharyngeal Swab  Result Value Ref Range Status   SARS Coronavirus 2 by RT PCR NEGATIVE NEGATIVE Final    Comment: (NOTE) SARS-CoV-2 target nucleic acids are NOT DETECTED.  The SARS-CoV-2 RNA is generally detectable in upper respiratoy specimens during the acute phase of infection. The lowest concentration of SARS-CoV-2 viral copies this assay can detect is 131 copies/mL. A negative result does not preclude SARS-Cov-2 infection and should not be used as the sole basis for treatment or other patient management decisions. A negative result may occur with  improper specimen collection/handling, submission of specimen other than nasopharyngeal swab, presence of viral mutation(s) within the areas targeted by this assay, and inadequate number of viral copies (<131 copies/mL). A negative result must be combined with clinical observations, patient history, and epidemiological information. The expected result is  Negative.  Fact Sheet for Patients:  PinkCheek.be  Fact Sheet for Healthcare Providers:  GravelBags.it  This test is no t yet approved or cleared by the [place] FDA and  has been authorized for detection and/or diagnosis of SARS-CoV-2 by FDA under an Emergency Use Authorization (EUA). This EUA will remain  in effect (meaning this test can be used) for the duration of the COVID-19 declaration under Section 564(b)(1) of the Act, 21 U.S.C. section 360bbb-3(b)(1), unless the authorization is terminated or revoked sooner.     Influenza A by PCR NEGATIVE NEGATIVE Final   Influenza B by PCR NEGATIVE NEGATIVE Final    Comment: (NOTE) The Xpert Xpress SARS-CoV-2/FLU/RSV assay is intended as an aid in  the diagnosis of influenza from Nasopharyngeal swab specimens and  should not be used as a sole basis for treatment. Nasal washings and  aspirates are unacceptable for Xpert Xpress SARS-CoV-2/FLU/RSV  testing.  Fact Sheet for Patients: PinkCheek.be  Fact Sheet for Healthcare Providers: GravelBags.it  This test is not yet approved or cleared by the [place] FDA and  has been authorized for detection and/or diagnosis of  SARS-CoV-2 by  FDA under an Emergency Use Authorization (EUA). This EUA will remain  in effect (meaning this test can be used) for the duration of the  Covid-19 declaration under Section 564(b)(1) of the Act, 21  U.S.C. section 360bbb-3(b)(1), unless the authorization is  terminated or revoked. Performed at [facility], [place] [address] [place], [place] [address]   Gram stain     Status: None   Collection Time: 04/25/20  9:25 AM   Specimen: Lung, Left; Pleural Fluid  Result Value Ref Range Status   Specimen Description FLUID  Final   Special Requests LUNG  Final   Gram Stain   Final    WBC PRESENT,BOTH PMN AND MONONUCLEAR NO ORGANISMS  SEEN CYTOSPIN SMEAR Performed at [facility], [address], [place], [place] [address]    Report Status 04/25/2020 FINAL  Final  Acid Fast Smear (AFB)     Status: None   Collection Time: 04/25/20  9:25 AM   Specimen: Lung, Left; Pleural Fluid  Result Value Ref Range Status   AFB Specimen Processing Concentration  Final   Acid Fast Smear Negative  Final    Comment: (NOTE) Performed At: [facility] [place], [place] [id number] Rush Farmer MD [id number]    Source (AFB) FLUID  Final    Comment: Performed at [facility], [place] [address], [place], [place] [address]     Time coordinating discharge:  I have spent 35 minutes face to face with the patient and on the ward discussing the patients care, assessment, plan and disposition with other care givers. >50% of the time was devoted counseling the patient about the risks and benefits of treatment/Discharge disposition and coordinating care.   SIGNED:   Damita Lack, MD  [facility] 04/30/2020, 1:37 PM   If 7PM-7AM, please contact night-coverage

## 2020-04-28 NOTE — Progress Notes (Signed)
Overall, Ms. Hyle is about the same. He will hopefully have the biopsy today. He MUST have a core biopsy. We really need to see the histology of this mass. The pleural fluid, if malignant, just will not give [place] any histologic diagnosis. I would not think that the biopsies that are obtained with a bronchoscopy would be enough to show [place] the architecture of this mass.  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 He could certainly could have a primary B-cell mediastinal lymphoma. And again with this, we really have to have the histology to be able to make a diagnosis.  He had his echocardiogram that was done. His heart function is fantastic.  We will continue to await the results of any pathology that might come back.  Lattie Haw, MD  1 Corinthians 16:13

## 2020-04-28 NOTE — Telephone Encounter (Signed)
-----   Message from Candee Furbish, MD sent at 04/26/2020  2:24 PM EDT ----- Regarding: APP follow up Follow up with CXR in 1-2 weeks.  If recurrence of effusion AND patient SOB, schedule pleurX after with me/BI/PC affirming eligibility with insurance.  If patient prefers to have pleurX done at [facility] see if we can make referral.  Linna Hoff

## 2020-04-28 NOTE — Progress Notes (Signed)
PROGRESS NOTE    Travis Duran  QJJ:[id number] DOB: Mar 16, 1976 DOA: 04/24/2020 PCP: Laurey Morale, MD   Brief Narrative:  44 years old male with medical history significant for osteoarthritis, Erectile dysfunction, obstructive sleep apnea  who developed exertional shortness of breath, and cough for 6 weeks.  He was treated with amoxicillin, Covid test was negative,  PCP has ordered CT chest found to have left upper lobe mass with significant pleural effusion.  Patient was sent to the emergency department. Patient is admitted for left lung mass associated with large pleural effusion.  Patient underwent thoracocentesis x 3, tolerated well.  Pulmonology consulted,  patient underwent bronchoscopy and biopsy was obtained. CT Abdomen showed lesion in the liver, MRCP ordered. MRCP showed 1.6 cm complex posterior right hepatic cyst and large anterior mediastinal mass.   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  Seen by pulmonary status post thoracentesis.  Cytology results from the fluid analysis is pending. -Oncology recommended CT-guided biopsy. Case discussed by oncology with IR, plans on procedure hopefully today. -MRCP-1.6 cm complex cyst in the posterior right hepatic lobe and large anterior mediastinal mass with malignant pleural effusion  Obstructive sleep apnea:Continue CPAP if needed in the hospital.  Insomnia As needed trazodone     DVT prophylaxis: Subcu heparin Code Status: Full code Family Communication: Wife at bedside  Status is: Inpatient  Remains inpatient appropriate because:Inpatient level of care appropriate due to severity of illness   Dispo: The patient is from: Home              Anticipated  d/c is to: Home              Anticipated d/c date is: 1 day              Patient currently is not medically stable to d/c. Pending CT-guided biopsy. Hopefully will be done later today.  Body mass index is 31.06 kg/m.      Subjective: Patient feels okay does not have any complaints. Anxiously waiting for his procedure  Review of Systems General: Denies fever, chills, night sweats or unintended weight loss. Resp: Denies cough, wheezing, shortness of breath. Cardiac: Denies chest pain, palpitations, orthopnea, paroxysmal nocturnal dyspnea. GI: Denies abdominal pain, nausea, vomiting, diarrhea or constipation GU: Denies dysuria, frequency, hesitancy or incontinence MS: Denies muscle aches, joint pain or swelling Neuro: Denies headache, neurologic deficits (focal weakness, numbness, tingling), abnormal gait Psych: Denies anxiety, depression, SI/HI/AVH Skin: Denies new rashes or lesions ID: Denies sick contacts, exotic exposures, travel  Examination: Constitutional: Not in acute distress Respiratory: Clear to auscultation bilaterally Cardiovascular: Normal sinus rhythm, no rubs Abdomen: Nontender nondistended good bowel sounds Musculoskeletal: No edema noted Skin: No rashes seen Neurologic: CN 2-12 grossly intact.  And nonfocal Psychiatric: Normal judgment and insight. Alert and oriented x 3. Normal mood.  Objective: Vitals:   04/26/20 2024 04/27/20 0451 04/27/20 2100 04/28/20 0558  BP: 127/78 117/80 115/89 109/79  Pulse: (!) 110 (!) 106 (!) 110 (!) 105  Resp: 16 17 18 18   Temp: 97.8 F (36.6 C) 98.5 F (36.9 C) 98.2 F (36.8 C) 97.6 F (36.4 C)  TempSrc: Oral Oral Oral Oral  SpO2: 94% 95% 94% 95%  Weight:      Height:  Intake/Output Summary (Last 24 hours) at 04/28/2020 1145 Last data filed at 04/27/2020 1700 Gross per 24 hour  Intake 480 ml  Output --  Net 480 ml   Filed Weights   04/24/20 1619 04/24/20 2016  Weight: 90.7 kg 95.4 kg     Data Reviewed:    CBC: Recent Labs  Lab 04/24/20 1620 04/25/20 0203 04/26/20 0240  WBC 9.6 9.2 8.7  NEUTROABS 7.6  --   --   HGB 13.6 12.2* 13.2  HCT 41.5 38.3* 40.6  MCV 88.5 88.2 87.1  PLT 461* 403* 413*   Basic Metabolic Panel: Recent Labs  Lab 04/24/20 1620 04/25/20 0203 04/26/20 0240 04/28/20 0149  NA 138 138 139 137  K 3.8 4.3 4.3 3.8  CL 104 104 106 105  CO2 23 24 22  20*  GLUCOSE 109* 101* 139* 108*  BUN 10 10 11 11   CREATININE 1.02 1.19 1.13 1.19  CALCIUM 9.1 9.1 9.0 8.7*  MG  --   --  1.7 1.7  PHOS  --   --  2.9  --    GFR: Estimated Creatinine Clearance: 90.3 mL/min (by C-G formula based on SCr of 1.19 mg/dL). Liver Function Tests: Recent Labs  Lab 04/25/20 0203 04/26/20 0240 04/28/20 0149  AST 23 26 33  ALT 31 34 48*  ALKPHOS 81 78 75  BILITOT 0.9 0.7 0.5  PROT 6.1* 5.8* 5.5*  ALBUMIN 2.9* 2.8* 2.6*   No results for input(s): LIPASE, AMYLASE in the last 168 hours. No results for input(s): AMMONIA in the last 168 hours. Coagulation Profile: No results for input(s): INR, PROTIME in the last 168 hours. Cardiac Enzymes: No results for input(s): CKTOTAL, CKMB, CKMBINDEX, TROPONINI in the last 168 hours. BNP (last 3 results) No results for input(s): PROBNP in the last 8760 hours. HbA1C: No results for input(s): HGBA1C in the last 72 hours. CBG: No results for input(s): GLUCAP in the last 168 hours. Lipid Profile: No results for input(s): CHOL, HDL, LDLCALC, TRIG, CHOLHDL, LDLDIRECT in the last 72 hours. Thyroid Function Tests: No results for input(s): TSH, T4TOTAL, FREET4, T3FREE, THYROIDAB in the last 72 hours. Anemia Panel: No results for input(s): VITAMINB12, FOLATE, FERRITIN, TIBC, IRON, RETICCTPCT in the last 72 hours. Sepsis Labs: No results for input(s): PROCALCITON, LATICACIDVEN in the last 168 hours.  Recent Results (from the past 240 hour(s))  Respiratory Panel by RT PCR (Flu A&B, Covid) - Nasopharyngeal Swab     Status: None   Collection Time:  04/24/20  4:20 PM   Specimen: Nasopharyngeal Swab  Result Value Ref Range Status   SARS Coronavirus 2 by RT PCR NEGATIVE NEGATIVE Final    Comment: (NOTE) SARS-CoV-2 target nucleic acids are NOT DETECTED.  The SARS-CoV-2 RNA is generally detectable in upper respiratoy specimens during the acute phase of infection. The lowest concentration of SARS-CoV-2 viral copies this assay can detect is 131 copies/mL. A negative result does not preclude SARS-Cov-2 infection and should not be used as the sole basis for treatment or other patient management decisions. A negative result may occur with  improper specimen collection/handling, submission of specimen other than nasopharyngeal swab, presence of viral mutation(s) within the areas targeted by this assay, and inadequate number of viral copies (<131 copies/mL). A negative result must be combined with clinical observations, patient history, and epidemiological information. The expected result is Negative.  Fact Sheet for Patients:  PinkCheek.be  Fact Sheet for Healthcare Providers:  GravelBags.it  This test is no t yet approved or  cleared by the [place] and  has been authorized for detection and/or diagnosis of SARS-CoV-2 by FDA under an Emergency Use Authorization (EUA). This EUA will remain  in effect (meaning this test can be used) for the duration of the COVID-19 declaration under Section 564(b)(1) of the Act, 21 U.S.C. section 360bbb-3(b)(1), unless the authorization is terminated or revoked sooner.     Influenza A by PCR NEGATIVE NEGATIVE Final   Influenza B by PCR NEGATIVE NEGATIVE Final    Comment: (NOTE) The Xpert Xpress SARS-CoV-2/FLU/RSV assay is intended as an aid in  the diagnosis of influenza from Nasopharyngeal swab specimens and  should not be used as a sole basis for treatment. Nasal washings and  aspirates are unacceptable for Xpert Xpress  SARS-CoV-2/FLU/RSV  testing.  Fact Sheet for Patients: PinkCheek.be  Fact Sheet for Healthcare Providers: GravelBags.it  This test is not yet approved or cleared by the [place] FDA and  has been authorized for detection and/or diagnosis of SARS-CoV-2 by  FDA under an Emergency Use Authorization (EUA). This EUA will remain  in effect (meaning this test can be used) for the duration of the  Covid-19 declaration under Section 564(b)(1) of the Act, 21  U.S.C. section 360bbb-3(b)(1), unless the authorization is  terminated or revoked. Performed at [facility], [place] [address] [place], [place] [address]   Gram stain     Status: None   Collection Time: 04/25/20  9:25 AM   Specimen: Lung, Left; Pleural Fluid  Result Value Ref Range Status   Specimen Description FLUID  Final   Special Requests LUNG  Final   Gram Stain   Final    WBC PRESENT,BOTH PMN AND MONONUCLEAR NO ORGANISMS SEEN CYTOSPIN SMEAR Performed at [facility], [address] [place], [place] [address]    Report Status 04/25/2020 FINAL  Final  Acid Fast Smear (AFB)     Status: None   Collection Time: 04/25/20  9:25 AM   Specimen: Lung, Left; Pleural Fluid  Result Value Ref Range Status   AFB Specimen Processing Concentration  Final   Acid Fast Smear Negative  Final    Comment: (NOTE) Performed At: [facility] [place], [place] [id number] Rush Farmer MD ZS:[id number]    Source (AFB) FLUID  Final    Comment: Performed at [facility], [place] [address] [place], [place] [address]         Radiology Studies: DG Chest Port 1 View  Result Date: 04/26/2020 CLINICAL DATA:  Status post thoracentesis. EXAM: PORTABLE CHEST 1 VIEW COMPARISON:  Radiograph yesterday.  Chest CT 04/24/2020 FINDINGS: Decreased size of left pleural effusion after thoracentesis with increasing aeration decreasing hazy opacity. There is  persistent blunting of the medial and lateral costophrenic angles and loculated fluid tracking at the apex. No definite pneumothorax. Slight residual rightward tracheal deviation. Heart size and mediastinal contours are obscured, however well-defined density in the left hemithorax likely corresponds to anterior mediastinal mass on chest CT. The right lung is clear. IMPRESSION: 1. Decreased size of left pleural effusion after thoracentesis with increasing aeration in the left hemithorax and decreasing hazy opacity. No definite pneumothorax. 2. Small volume of persistent partially loculated left pleural fluid. Electronically Signed   By: Keith Rake M.D.   On: 04/26/2020 15:38   MR ABDOMEN MRCP W WO CONTAST  Result Date: 04/27/2020 CLINICAL DATA:  Non-small cell lung carcinoma. Indeterminate liver lesion on recent CT. Staging. EXAM: MRI ABDOMEN WITHOUT  AND WITH CONTRAST (INCLUDING MRCP) TECHNIQUE: Multiplanar multisequence MR imaging of the abdomen was performed both before and after the administration of intravenous contrast. Heavily T2-weighted images of the biliary and pancreatic ducts were obtained, and three-dimensional MRCP images were rendered by post processing. CONTRAST:  61mL GADAVIST GADOBUTROL 1 MMOL/ML IV SOLN COMPARISON:  CT on 04/26/2020 FINDINGS: Lower chest: Large anterior mediastinal mass and malignant left pleural effusion with numerous pleural based soft tissue masses, as better demonstrated on recent chest CT. Hepatobiliary: Image degradation by motion artifact noted. A solitary mildly complex cystic lesion is seen in the posterior right hepatic lobe which measures 1.6 cm. This shows a mildly lobulated contour and a few peripheral septations, but does not have a solid or enhancing component, and does not have typical features of a metastasis. No other liver lesions are identified. Gallbladder is unremarkable. No evidence of biliary ductal dilatation. Pancreas:  No mass or inflammatory  changes. Spleen:  Within normal limits in size and appearance. Adrenals/Urinary Tract: No adrenal or renal masses identified. No evidence of hydronephrosis. Stomach/Bowel: Visualized portion unremarkable. Vascular/Lymphatic: No pathologically enlarged lymph nodes identified. No abdominal aortic aneurysm. Other:  None. Musculoskeletal:  No suspicious bone lesions identified. IMPRESSION: 1.6 cm complex cyst in the posterior right hepatic lobe. No evidence of hepatic or other abdominal metastatic disease. Large anterior mediastinal mass and malignant left pleural effusion , as better demonstrated on recent chest CT. Electronically Signed   By: Marlaine Hind M.D.   On: 04/27/2020 08:26   ECHOCARDIOGRAM COMPLETE  Result Date: 04/27/2020    ECHOCARDIOGRAM REPORT   Patient Name:   DEPAUL ARIZPE Date of Exam: 04/27/2020 Medical Rec #:  [id number]    Height:       69.0 in Accession #:    [id number]   Weight:       210.3 lb Date of Birth:  [birth year]     BSA:          2.111 m Patient Age:    79 years     BP:           117/80 mmHg Patient Gender: M            HR:           105 bpm. Exam Location:  Inpatient Procedure: 2D Echo, Color Doppler, Cardiac Doppler and Strain Analysis Indications:    Pre-Chemo Evaluation  History:        Patient has no prior history of Echocardiogram examinations.                 Risk Factors:Sleep Apnea. Mediastinal mass.  Sonographer:    Raquel Sarna Senior RDCS Referring Phys: [place] Comments: Technically challenging due to shadowing from mass and shifted heart position in chest. IMPRESSIONS  1. Left ventricular ejection fraction, by estimation, is 60 to 65%. The left ventricle has normal function. The left ventricle has no regional wall motion abnormalities. Left ventricular diastolic parameters are consistent with Grade I diastolic dysfunction (impaired relaxation). The average left ventricular global longitudinal strain is -17.3 %. The global longitudinal strain is normal.  2.  Right ventricular systolic function is normal. The right ventricular size is normal.  3. The mitral valve is normal in structure. No evidence of mitral valve regurgitation. No evidence of mitral stenosis.  4. The aortic valve is tricuspid. Aortic valve regurgitation is not visualized. No aortic stenosis is present.  5. The inferior vena cava is dilated in size with <50% respiratory  variability, suggesting right atrial pressure of 15 mmHg. FINDINGS  Left Ventricle: Left ventricular ejection fraction, by estimation, is 60 to 65%. The left ventricle has normal function. The left ventricle has no regional wall motion abnormalities. The average left ventricular global longitudinal strain is -17.3 %. The global longitudinal strain is normal. The left ventricular internal cavity size was normal in size. There is no left ventricular hypertrophy. Left ventricular diastolic parameters are consistent with Grade I diastolic dysfunction (impaired relaxation). Right Ventricle: The right ventricular size is normal. No increase in right ventricular wall thickness. Right ventricular systolic function is normal. Left Atrium: Left atrial size was normal in size. Right Atrium: Right atrial size was normal in size. Pericardium: There is no evidence of pericardial effusion. Mitral Valve: The mitral valve is normal in structure. No evidence of mitral valve regurgitation. No evidence of mitral valve stenosis. Tricuspid Valve: The tricuspid valve is normal in structure. Tricuspid valve regurgitation is not demonstrated. No evidence of tricuspid stenosis. Aortic Valve: The aortic valve is tricuspid. Aortic valve regurgitation is not visualized. No aortic stenosis is present. Pulmonic Valve: The pulmonic valve was normal in structure. Pulmonic valve regurgitation is not visualized. No evidence of pulmonic stenosis. Aorta: The aortic root is normal in size and structure. Venous: The inferior vena cava is dilated in size with less than 50%  respiratory variability, suggesting right atrial pressure of 15 mmHg. IAS/Shunts: No atrial level shunt detected by color flow Doppler. Additional Comments: Mass noted anterior to the right ventricle and apex.  LEFT VENTRICLE PLAX 2D LVOT diam:     2.20 cm LV SV:         65       2D Longitudinal Strain LV SV Index:   31       2D Strain GLS (A2C):   -15.8 % LVOT Area:     3.80 cm 2D Strain GLS (A3C):   -18.4 %                         2D Strain GLS (A4C):   -17.8 %                         2D Strain GLS Avg:     -17.3 % RIGHT VENTRICLE RV S prime:     10.00 cm/s TAPSE (M-mode): 1.5 cm LEFT ATRIUM             Index       RIGHT ATRIUM           Index LA Vol (A2C):   25.9 ml 12.27 ml/m RA Area:     13.40 cm LA Vol (A4C):   17.6 ml 8.34 ml/m  RA Volume:   32.00 ml  15.16 ml/m LA Biplane Vol: 21.9 ml 10.38 ml/m  AORTIC VALVE LVOT Vmax:   97.30 cm/s LVOT Vmean:  74.600 cm/s LVOT VTI:    0.170 m  AORTA Ao Root diam: 3.30 cm Ao Asc diam:  3.30 cm  SHUNTS Systemic VTI:  0.17 m Systemic Diam: 2.20 cm Skeet Latch MD Electronically signed by Skeet Latch MD Signature Date/Time: 04/27/2020/12:40:00 PM    Final         Scheduled Meds:  Continuous Infusions:   LOS: 4 days   Time spent= 20 mins    Zafar Debrosse Arsenio Loader, MD [facility]  If 7PM-7AM, please contact night-coverage  04/28/2020, 11:45 AM

## 2020-04-28 NOTE — Consult Note (Signed)
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 meeting Travis Duran and look forward to participating in their care.  A copy of this report was sent to the requesting provider on this date.  Electronically Signed: Docia Barrier, PA 04/28/2020, 1:54 PM   I spent a total of 40 Minutes    in face to face  in clinical consultation, greater than 50% of which was counseling/coordinating care for mediastinal mass.

## 2020-04-28 NOTE — Discharge Instructions (Signed)
Lung Mass  A lung mass is a growth in the lung that is larger than 3 centimeters (1.2 inches). Smaller growths are called nodules. Most lung nodules are not cancer. Lung masses have a higher risk of being cancer. A lung mass is sometimes found during a routine chest X-ray or while doing other imaging tests to check for other problems. What are common types of lung masses? Lung masses include:  Tumors. These may be cancerous (malignant) or noncancerous (benign).  Infectious masses (granulomas). These are masses caused by inflammation from bacterial infections, like tuberculosis, or fungal infections.  Noninfectious masses. Some diseases that cause lung inflammation may also cause lung masses to form.  Blood vessel malformations. What type of testing may be needed? Your health care provider may recommend that you have tests to diagnose the cause of your lung mass. The following tests may be done if a lung mass is found:  Physical exam.  Imaging tests, such as: ? Chest X-rays. ? CT scan. ? PET scan. This scan measures how much energy a mass is using.  Biopsy to rule out cancer or confirm a diagnosis. This procedure involves removing a tissue sample from the mass with a needle inserted through the chest, using a scope placed down into the lung, or through open surgery. Tests and physical exams may be done once, or they may be done regularly for a period of time. Tests and exams that are done regularly will help monitor whether the mass or tissue change is growing and becoming a concern. What are common treatments? Treatment for a lung mass depends on the cause. Noncancerous masses may require treatment specific to the cause. Treatment options for a cancerous lung mass may include:  Surgical removal.  Radiation therapy.  Chemotherapy. Follow these instructions at home: If you have had surgery or a biopsy, your health care provider will give you specific instructions for taking care of  yourself at home after your procedure. Follow these instructions carefully. General home care instructions include:  Take over-the-counter and prescription medicines only as told by your health care provider.  Return to your normal activities as told by your health care provider. Ask your health care provider what activities are safe for you.  Do not use any products that contain nicotine or tobacco, such as cigarettes, e-cigarettes, and chewing tobacco. If you need help quitting, ask your health care provider.  Keep all follow-up visits as told by your health care provider. This is important. Contact a health care provider if you:  Have pain in your chest, back, or shoulder.  Are short of breath.  Have a cough.  Cough up blood or bloody sputum. Summary  A lung mass is a growth in the lung that is larger than 3 centimeters (1.2 inches).  Lung masses have a higher risk of being cancer than do smaller growths.  Sometimes a lung mass is found during a routine chest X-ray or other imaging test.  Your health care provider may do a biopsy of the lung mass to rule out or confirm cancer.  Treatment for this condition depends on the cause. This information is not intended to replace advice given to you by your health care provider. Make sure you discuss any questions you have with your health care provider. Document Revised: 11/10/2018 Document Reviewed: 03/14/2018 Elsevier Patient Education  [place].   Pleural Effusion Pleural effusion is an abnormal buildup of fluid in the layers of tissue between the lungs and the inside  of the chest (pleural space) The two layers of tissue that line the lungs and the inside of the chest are called pleura. Usually, there is no air in the space between the pleura, only a thin layer of fluid. Some conditions can cause a large amount of fluid to build up, which can cause the lung to collapse if untreated. A pleural effusion is usually caused by  another disease that requires treatment. What are the causes? Pleural effusion can be caused by:  Heart failure.  Certain infections, such as pneumonia or tuberculosis.  Cancer.  A blood clot in the lung (pulmonary embolism).  Complications from surgery, such as from open heart surgery.  Liver disease (cirrhosis).  Kidney disease. What are the signs or symptoms? In some cases, pleural effusion may cause no symptoms. If symptoms are present, they may include:  Shortness of breath, especially when lying down.  Chest pain. This may get worse when taking a deep breath.  Fever.  Dry, long-lasting (chronic) cough.  Hiccups.  Rapid breathing. An underlying condition that is causing the pleural effusion (such as heart failure, pneumonia, blood clots, tuberculosis, or cancer) may also cause other symptoms. How is this diagnosed? This condition may be diagnosed based on:  Your symptoms and medical history.  A physical exam.  A chest X-ray.  A procedure to use a needle to remove fluid from the pleural space (thoracentesis). This fluid is tested.  Other imaging studies of the chest, such as ultrasound or CT scan. How is this treated? Depending on the cause of your condition, treatment may include:  Treating the underlying condition that is causing the effusion. When that condition improves, the effusion will also improve. Examples of treatment for underlying conditions include: ? Antibiotic medicines to treat an infection. ? Diuretics or other heart medicines to treat heart failure.  Thoracentesis.  Placing a thin flexible tube under your skin and into your chest to continuously drain the effusion (indwelling pleural catheter).  Surgery to remove the outer layer of tissue from the pleural space (decortication).  A procedure to put medicine into the chest cavity to seal the pleural space and prevent fluid buildup (pleurodesis).  Chemotherapy and radiation therapy, if you  have cancerous (malignant) pleural effusion. These treatments are typically used to treat cancer. They kill certain cells in the body. Follow these instructions at home:  Take over-the-counter and prescription medicines only as told by your health care provider.  Ask your health care provider what activities are safe for you.  Keep track of how long you are able to do mild exercise (such as walking) before you get short of breath. Write down this information to share with your health care provider. Your ability to exercise should improve over time.  Do not use any products that contain nicotine or tobacco, such as cigarettes and e-cigarettes. If you need help quitting, ask your health care provider.  Keep all follow-up visits as told by your health care provider. This is important. Contact a health care provider if:  The amount of time that you are able to do mild exercise: ? Decreases. ? Does not improve with time.  You have a fever. Get help right away if:  You are short of breath.  You develop chest pain.  You develop a new cough. Summary  Pleural effusion is an abnormal buildup of fluid in the layers of tissue between the lungs and the inside of the chest.  Pleural effusion can have many causes, including heart  failure, pulmonary embolism, infections, or cancer.  Symptoms of pleural effusion can include shortness of breath, chest pain, fever, long-lasting (chronic) cough, hiccups, or rapid breathing.  Diagnosis often involves making images of the chest (such as with ultrasound or X-ray) and removing fluid (thoracentesis) to send for testing.  Treatment for pleural effusion depends on what underlying condition is causing it. This information is not intended to replace advice given to you by your health care provider. Make sure you discuss any questions you have with your health care provider. Document Revised: 07/01/2017 Document Reviewed: 03/24/2017 Elsevier Patient  Education  2020 [employer].

## 2020-04-29 ENCOUNTER — Encounter: Payer: Self-pay | Admitting: *Deleted

## 2020-04-29 NOTE — Progress Notes (Signed)
Reached out to Travis Duran to introduce myself as the office RN Navigator and explain our new patient process. Reviewed the reason for their referral and scheduled their new patient appointment along with labs. Provided address and directions to the office including call back phone number. Reviewed with patient any concerns they may have or any possible barriers to attending their appointment.   Oncology Nurse Navigator Documentation  Oncology Nurse Navigator Flowsheets 04/29/2020  Abnormal Finding Date 04/24/2020  Diagnosis Status Additional Work Up  Navigator Follow Up Date: 05/01/2020  Navigator Follow Up Reason: New Patient Appointment  Navigator Location [facility]  Navigator Encounter Type Introductory Phone Call  Patient Visit Type MedOnc  Treatment Phase Abnormal Scans;Abnormal Labs  Barriers/Navigation Needs Coordination of Care;Education  Education Other  Interventions Coordination of Care;Education  Acuity Level 2-Minimal Needs (1-2 Barriers Identified)  Coordination of Care Appts  Education Method Verbal  Support Groups/Services Friends and Family  Time Spent with Patient 38

## 2020-04-30 LAB — CYTOLOGY - NON PAP

## 2020-05-01 ENCOUNTER — Inpatient Hospital Stay: Payer: BC Managed Care – PPO | Attending: Hematology & Oncology

## 2020-05-01 ENCOUNTER — Telehealth: Payer: Self-pay | Admitting: *Deleted

## 2020-05-01 ENCOUNTER — Inpatient Hospital Stay (HOSPITAL_BASED_OUTPATIENT_CLINIC_OR_DEPARTMENT_OTHER): Payer: BC Managed Care – PPO | Admitting: Hematology & Oncology

## 2020-05-01 ENCOUNTER — Other Ambulatory Visit: Payer: Self-pay | Admitting: *Deleted

## 2020-05-01 ENCOUNTER — Encounter: Payer: Self-pay | Admitting: *Deleted

## 2020-05-01 ENCOUNTER — Encounter: Payer: Self-pay | Admitting: Hematology & Oncology

## 2020-05-01 ENCOUNTER — Other Ambulatory Visit: Payer: Self-pay

## 2020-05-01 VITALS — BP 107/72 | HR 113 | Temp 98.2°F | Resp 20 | Wt 200.0 lb

## 2020-05-01 DIAGNOSIS — C8582 Other specified types of non-Hodgkin lymphoma, intrathoracic lymph nodes: Secondary | ICD-10-CM | POA: Insufficient documentation

## 2020-05-01 DIAGNOSIS — R918 Other nonspecific abnormal finding of lung field: Secondary | ICD-10-CM

## 2020-05-01 HISTORY — DX: Other specified types of non-hodgkin lymphoma, intrathoracic lymph nodes: C85.82

## 2020-05-01 LAB — CMP (CANCER CENTER ONLY)
ALT: 74 U/L — ABNORMAL HIGH (ref 0–44)
AST: 34 U/L (ref 15–41)
Albumin: 3.8 g/dL (ref 3.5–5.0)
Alkaline Phosphatase: 90 U/L (ref 38–126)
Anion gap: 8 (ref 5–15)
BUN: 16 mg/dL (ref 6–20)
CO2: 30 mmol/L (ref 22–32)
Calcium: 9.6 mg/dL (ref 8.9–10.3)
Chloride: 100 mmol/L (ref 98–111)
Creatinine: 1.23 mg/dL (ref 0.61–1.24)
GFR, Est AFR Am: >60 mL/min (ref >60)
GFR, Estimated: >60 mL/min (ref >60)
Glucose, Bld: 96 mg/dL (ref 70–99)
Potassium: 4.1 mmol/L (ref 3.5–5.1)
Sodium: 138 mmol/L (ref 135–145)
Total Bilirubin: 0.5 mg/dL (ref 0.3–1.2)
Total Protein: 6.8 g/dL (ref 6.5–8.1)

## 2020-05-01 LAB — CBC WITH DIFFERENTIAL (CANCER CENTER ONLY)
Abs Immature Granulocytes: 0.05 10*3/uL (ref 0.00–0.07)
Basophils Absolute: 0 10*3/uL (ref 0.0–0.1)
Basophils Relative: 0 %
Eosinophils Absolute: 0.4 10*3/uL (ref 0.0–0.5)
Eosinophils Relative: 4 %
HCT: 44 % (ref 39.0–52.0)
Hemoglobin: 14.1 g/dL (ref 13.0–17.0)
Immature Granulocytes: 0 %
Lymphocytes Relative: 10 %
Lymphs Abs: 1.1 10*3/uL (ref 0.7–4.0)
MCH: 28.2 pg (ref 26.0–34.0)
MCHC: 32 g/dL (ref 30.0–36.0)
MCV: 88 fL (ref 80.0–100.0)
Monocytes Absolute: 1.3 10*3/uL — ABNORMAL HIGH (ref 0.1–1.0)
Monocytes Relative: 11 %
Neutro Abs: 8.5 10*3/uL — ABNORMAL HIGH (ref 1.7–7.7)
Neutrophils Relative %: 75 %
Platelet Count: 458 10*3/uL — ABNORMAL HIGH (ref 150–400)
RBC: 5 MIL/uL (ref 4.22–5.81)
RDW: 13.8 % (ref 11.5–15.5)
WBC Count: 11.4 10*3/uL — ABNORMAL HIGH (ref 4.0–10.5)
nRBC: 0 % (ref 0.0–0.2)

## 2020-05-01 LAB — SURGICAL PATHOLOGY

## 2020-05-01 MED ORDER — HYDROCODONE-HOMATROPINE 5-1.5 MG/5ML PO SYRP
5.0000 mL | ORAL_SOLUTION | Freq: Four times a day (QID) | ORAL | 0 refills | Status: DC | PRN
Start: 2020-05-01 — End: 2020-06-30

## 2020-05-01 MED FILL — HYDROCODONE-HOMATROPINE SOL: 5-1.5 | 6 days supply | Qty: 120 | Fill #0

## 2020-05-01 NOTE — Progress Notes (Signed)
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

## 2020-05-01 NOTE — Telephone Encounter (Signed)
Call received from patient requesting a prescription for a cough medicine d/t continued cough.  Dr. Marin Olp notified and order received for Hycodan to be sent downstairs to [place].  Pt notified to pick up prescription with today's appt.  Pt appreciative of assistance and has no further questions at this time.

## 2020-05-01 NOTE — Progress Notes (Signed)
Initial RN Navigator Patient Visit  Name: Travis Duran Diagnosis: Primary Mediastinal B Cell Lymphoma  Met with patient prior to their visit with MD. [name] patient "Your Patient Navigator" handout which explains my role, areas in which I am able to help, and all the contact information for myself and the office. Also gave patient MD and Navigator business card. Reviewed with patient the general overview of expected course after initial diagnosis and time frame for all steps to be completed.  New patient packet given to patient which includes: orientation to office and staff; campus directory; education on My Chart and Advance Directives; and patient centered education on Lymphoma. Also provided information on Leukemia and Lymphoma Society.   Patient completed visit with Dr. Marin Olp.   Patient lives much closer to our [facility] and may want to transfer care there, however they will think more about this. They would prefer to not transfer MDs. An attempt will be made to schedule some services closer to their home.   Patient will be admitted to the hospital tomorrow to initiate cycle one of [facility]. Dr Marin Olp spoke to IR for stat central line placement, and I spoke to AD for the unit. Patient will be admitted after his PICC placement when room become available.    Patient understands all follow up procedures and expectations. They have my number to reach out for any further clarification or additional needs.

## 2020-05-01 NOTE — Progress Notes (Signed)
Hematology and Oncology Follow Up Visit  Travis Duran [id number] [birth year] 44 y.o. 05/01/2020   Principle Diagnosis:   Primary Mediastinal B-cell Lymphoma  Current Therapy:    Da-EPOCH -- cycle #1 on 05/02/2020     Interim History:  Travis Duran is in for his first office visit.  I saw him in consultation at [facility] last week.  He came in with a large mediastinal mass.  He ultimately had a biopsy of this mass.  Had a large left pleural effusion.  This was drained.  The pathology report [facility] -[phone]) showed a primary mediastinal large B cell lymphoma.  By the staging studies as an inpatient, there is no obvious metastatic disease.  His labs have looked fine.  I do not think he needs to have a bone marrow biopsy.  His major problem is his cough.  Has a terrible cough.  We really need to get treatment going on him quickly.  This is a very large mass.  It measures 14.6 x 11.7 x 18.5 cm.  He is in very good shape.  He certainly would be a good candidate for aggressive chemotherapy.  I think the standard of care for mediastinal B-cell lymphoma as would be infusional chemotherapy with R-EPOCH.  He needs have a central access.  We will get a temporary PICC line into him initially..  He has had no fever.  There is no rashes.  He has a dry cough.  It is nonproductive.  There is no problems with bowels or bladder.  He has had no nausea or vomiting.  There is no headache.  He had an echocardiogram done last week.  He had a ejection fraction of 60-65%.  He has negative for Hepatitis B  Currently, his performance status is ECOG 1.  Medications:  Current Outpatient Medications:  .  fluticasone (FLOVENT HFA) 110 MCG/ACT inhaler, Inhale 2 puffs into the lungs in the morning and at bedtime., Disp: 12 g, Rfl: 0 .  HYDROcodone-homatropine (HYCODAN) 5-1.5 MG/5ML syrup, Take 5 mLs by mouth every 6 (six) hours as needed for cough., Disp: 120 mL, Rfl: 0 .  tadalafil (CIALIS) 10 MG  tablet, Take 1 tablet (10 mg total) by mouth daily as needed for erectile dysfunction., Disp: 10 tablet, Rfl: 11  Allergies: No Known Allergies  Past Medical History, Surgical history, Social history, and Family History were reviewed and updated.  Review of Systems: Review of Systems  Constitutional: Negative.   HENT:  Negative.   Eyes: Negative.   Respiratory: Positive for chest tightness and cough.   Cardiovascular: Positive for chest pain.  Gastrointestinal: Negative.   Endocrine: Negative.   Genitourinary: Negative.    Musculoskeletal: Negative.   Skin: Negative.   Neurological: Negative.   Hematological: Negative.   Psychiatric/Behavioral: Negative.     Physical Exam:  weight is 200 lb (90.7 kg). His oral temperature is 98.2 F (36.8 C). His blood pressure is 107/72 and his pulse is 113 (abnormal). His respiration is 20 and oxygen saturation is 98%.   Wt Readings from Last 3 Encounters:  05/01/20 200 lb (90.7 kg)  04/24/20 210 lb 5.1 oz (95.4 kg)  03/31/20 211 lb 12.8 oz (96.1 kg)    Physical Exam Vitals reviewed.  HENT:     Head: Normocephalic and atraumatic.  Eyes:     Pupils: Pupils are equal, round, and reactive to light.  Cardiovascular:     Rate and Rhythm: Normal rate and regular rhythm.     Heart  sounds: Normal heart sounds.     Comments: Cardiac exam shows a regular rate and rhythm with no murmurs, rubs or bruits. Pulmonary:     Effort: Pulmonary effort is normal.     Breath sounds: Normal breath sounds.     Comments: Lungs showed scattered wheezes bilaterally.  He has some decreased breath sounds around the left side.  Some rales are noted.  He does have decent airway movement bilaterally. Abdominal:     General: Bowel sounds are normal.     Palpations: Abdomen is soft.  Musculoskeletal:        General: No tenderness or deformity. Normal range of motion.     Cervical back: Normal range of motion.  Lymphadenopathy:     Cervical: No cervical  adenopathy.  Skin:    General: Skin is warm and dry.     Findings: No erythema or rash.  Neurological:     Mental Status: He is alert and oriented to person, place, and time.  Psychiatric:        Behavior: Behavior normal.        Thought Content: Thought content normal.        Judgment: Judgment normal.      Lab Results  Component Value Date   WBC 11.4 (H) 05/01/2020   HGB 14.1 05/01/2020   HCT 44.0 05/01/2020   MCV 88.0 05/01/2020   PLT 458 (H) 05/01/2020     Chemistry      Component Value Date/Time   NA 138 05/01/2020 1442   K 4.1 05/01/2020 1442   CL 100 05/01/2020 1442   CO2 30 05/01/2020 1442   BUN 16 05/01/2020 1442   CREATININE 1.23 05/01/2020 1442      Component Value Date/Time   CALCIUM 9.6 05/01/2020 1442   ALKPHOS 90 05/01/2020 1442   AST 34 05/01/2020 1442   ALT 74 (H) 05/01/2020 1442   BILITOT 0.5 05/01/2020 1442      Impression and Plan: Travis Duran is a very nice 44 year old white male.  He has a primary mediastinal large B-cell lymphoma.  I must say this is quite large tumor.  I suppose he may have some pleural involvement given that he has the pleural effusion that was malignant.  I still feel that this is a curable tumor.  With the [facility] regimen, we certainly might be able to utilize systemic chemotherapy without being radiation therapy.  I spent a good hour with he and his wife.  They are both very very nice.  I explained what kind of lymphoma this was.  I gave them information sheets about the protocol.  We will be able to get this started tomorrow.  Spoke with interventional radiology.  Dr. Vernard Gambles will try to get him on the schedule for it in the morning at [facility] for a PICC line.  He will then be admitted to the hospital and start chemotherapy that day.  Hopefully, we will find that within a week or so, he will start having decreased symptoms.  His cough hopefully will be a lot better.  We will have to get a PET scan set up on  him as an outpatient.  I will plan on his next cycle of chemotherapy to start on October 25.       Travis Napoleon, MD 9/30/20215:06 PM

## 2020-05-02 ENCOUNTER — Telehealth: Payer: Self-pay | Admitting: Hematology & Oncology

## 2020-05-02 ENCOUNTER — Inpatient Hospital Stay (HOSPITAL_COMMUNITY)
Admission: AD | Admit: 2020-05-02 | Discharge: 2020-05-07 | DRG: 829 | Disposition: A | Discharge status: Home / Self Care | Payer: BC Managed Care – PPO | Attending: Hematology & Oncology | Admitting: Hematology & Oncology

## 2020-05-02 ENCOUNTER — Encounter (HOSPITAL_COMMUNITY): Payer: Self-pay | Admitting: Hematology & Oncology

## 2020-05-02 ENCOUNTER — Ambulatory Visit (HOSPITAL_COMMUNITY)
Admission: RE | Admit: 2020-05-02 | Discharge: 2020-05-02 | Disposition: A | Discharge status: Home / Self Care | Payer: BC Managed Care – PPO | Source: Ambulatory Visit | Attending: Hematology & Oncology | Admitting: Hematology & Oncology

## 2020-05-02 ENCOUNTER — Telehealth: Payer: Self-pay | Admitting: Pulmonary Disease

## 2020-05-02 ENCOUNTER — Other Ambulatory Visit: Payer: Self-pay | Admitting: Hematology & Oncology

## 2020-05-02 DIAGNOSIS — Z8249 Family history of ischemic heart disease and other diseases of the circulatory system: Secondary | ICD-10-CM

## 2020-05-02 DIAGNOSIS — C8330 Diffuse large B-cell lymphoma, unspecified site: Secondary | ICD-10-CM | POA: Diagnosis present

## 2020-05-02 DIAGNOSIS — Z20822 Contact with and (suspected) exposure to covid-19: Secondary | ICD-10-CM | POA: Diagnosis present

## 2020-05-02 DIAGNOSIS — Z5111 Encounter for antineoplastic chemotherapy: Secondary | ICD-10-CM

## 2020-05-02 DIAGNOSIS — Z79899 Other long term (current) drug therapy: Secondary | ICD-10-CM

## 2020-05-02 DIAGNOSIS — G4733 Obstructive sleep apnea (adult) (pediatric): Secondary | ICD-10-CM | POA: Diagnosis present

## 2020-05-02 DIAGNOSIS — C8218 Follicular lymphoma grade II, lymph nodes of multiple sites: Secondary | ICD-10-CM

## 2020-05-02 DIAGNOSIS — J90 Pleural effusion, not elsewhere classified: Secondary | ICD-10-CM

## 2020-05-02 DIAGNOSIS — Z452 Encounter for adjustment and management of vascular access device: Secondary | ICD-10-CM

## 2020-05-02 DIAGNOSIS — M1711 Unilateral primary osteoarthritis, right knee: Secondary | ICD-10-CM | POA: Diagnosis present

## 2020-05-02 DIAGNOSIS — C8582 Other specified types of non-Hodgkin lymphoma, intrathoracic lymph nodes: Secondary | ICD-10-CM | POA: Diagnosis not present

## 2020-05-02 DIAGNOSIS — Z825 Family history of asthma and other chronic lower respiratory diseases: Secondary | ICD-10-CM

## 2020-05-02 DIAGNOSIS — J9859 Other diseases of mediastinum, not elsewhere classified: Secondary | ICD-10-CM | POA: Diagnosis present

## 2020-05-02 DIAGNOSIS — C8332 Diffuse large B-cell lymphoma, intrathoracic lymph nodes: Secondary | ICD-10-CM | POA: Diagnosis present

## 2020-05-02 DIAGNOSIS — J910 Malignant pleural effusion: Secondary | ICD-10-CM | POA: Diagnosis present

## 2020-05-02 DIAGNOSIS — C8520 Mediastinal (thymic) large B-cell lymphoma, unspecified site: Secondary | ICD-10-CM | POA: Diagnosis not present

## 2020-05-02 DIAGNOSIS — Z9889 Other specified postprocedural states: Secondary | ICD-10-CM

## 2020-05-02 HISTORY — DX: Encounter for antineoplastic chemotherapy: Z51.11

## 2020-05-02 LAB — RESPIRATORY PANEL BY RT PCR (FLU A&B, COVID)
Influenza A by PCR: NEGATIVE
Influenza B by PCR: NEGATIVE
SARS Coronavirus 2 by RT PCR: NEGATIVE

## 2020-05-02 LAB — COMPREHENSIVE METABOLIC PANEL
ALT: 63 U/L — ABNORMAL HIGH (ref 0–44)
AST: 25 U/L (ref 15–41)
Albumin: 3.6 g/dL (ref 3.5–5.0)
Alkaline Phosphatase: 95 U/L (ref 38–126)
Anion gap: 12 (ref 5–15)
BUN: 21 mg/dL — ABNORMAL HIGH (ref 6–20)
CO2: 23 mmol/L (ref 22–32)
Calcium: 9.1 mg/dL (ref 8.9–10.3)
Chloride: 100 mmol/L (ref 98–111)
Creatinine, Ser: 1.07 mg/dL (ref 0.61–1.24)
GFR calc Af Amer: >60 mL/min (ref >60)
GFR calc non Af Amer: >60 mL/min (ref >60)
Glucose, Bld: 92 mg/dL (ref 70–99)
Potassium: 4.1 mmol/L (ref 3.5–5.1)
Sodium: 135 mmol/L (ref 135–145)
Total Bilirubin: 0.9 mg/dL (ref 0.3–1.2)
Total Protein: 6.8 g/dL (ref 6.5–8.1)

## 2020-05-02 LAB — CBC
HCT: 41.1 % (ref 39.0–52.0)
Hemoglobin: 13.4 g/dL (ref 13.0–17.0)
MCH: 28.5 pg (ref 26.0–34.0)
MCHC: 32.6 g/dL (ref 30.0–36.0)
MCV: 87.4 fL (ref 80.0–100.0)
Platelets: 431 10*3/uL — ABNORMAL HIGH (ref 150–400)
RBC: 4.7 MIL/uL (ref 4.22–5.81)
RDW: 13.9 % (ref 11.5–15.5)
WBC: 10.7 10*3/uL — ABNORMAL HIGH (ref 4.0–10.5)
nRBC: 0 % (ref 0.0–0.2)

## 2020-05-02 LAB — LACTATE DEHYDROGENASE: LDH: 937 U/L — ABNORMAL HIGH (ref 98–192)

## 2020-05-02 MED ORDER — ACETAMINOPHEN 325 MG PO TABS: 650.0000 mg | ORAL_TABLET | ORAL | Status: DC | PRN

## 2020-05-02 MED ORDER — LIDOCAINE HCL 1 % IJ SOLN
INTRAMUSCULAR | Status: AC
  Filled 2020-05-02: qty 20

## 2020-05-02 MED ORDER — HOT PACK MISC ONCOLOGY
1.0000 | Freq: Once | Status: DC | PRN
  Filled 2020-05-02: qty 1

## 2020-05-02 MED ORDER — SODIUM CHLORIDE 0.9% FLUSH: 10.0000 mL | INTRAVENOUS | Status: DC | PRN

## 2020-05-02 MED ORDER — ALLOPURINOL 100 MG PO TABS
100.0000 mg | ORAL_TABLET | Freq: Every day | ORAL | Status: DC
  Administered 2020-05-02 – 2020-05-07 (×6): 100 mg via ORAL
  Filled 2020-05-02 (×6): qty 1

## 2020-05-02 MED ORDER — ONDANSETRON HCL 4 MG PO TABS: 4.0000 mg | ORAL_TABLET | Freq: Three times a day (TID) | ORAL | Status: DC | PRN

## 2020-05-02 MED ORDER — BISACODYL 5 MG PO TBEC: 5.0000 mg | DELAYED_RELEASE_TABLET | Freq: Every day | ORAL | Status: DC | PRN

## 2020-05-02 MED ORDER — ALTEPLASE 2 MG IJ SOLR
2.0000 mg | Freq: Once | INTRAMUSCULAR | Status: DC | PRN
  Filled 2020-05-02: qty 2

## 2020-05-02 MED ORDER — ONDANSETRON HCL 4 MG/2ML IJ SOLN: 4.0000 mg | Freq: Three times a day (TID) | INTRAMUSCULAR | Status: DC | PRN

## 2020-05-02 MED ORDER — SODIUM CHLORIDE 0.9 % IV SOLN
Freq: Once | INTRAVENOUS | Status: AC
  Administered 2020-05-03: 8 mg via INTRAVENOUS
  Filled 2020-05-02: qty 4

## 2020-05-02 MED ORDER — VINCRISTINE SULFATE CHEMO INJECTION 1 MG/ML
Freq: Once | INTRAVENOUS | Status: AC
  Filled 2020-05-02: qty 11

## 2020-05-02 MED ORDER — HEPARIN SOD (PORK) LOCK FLUSH 100 UNIT/ML IV SOLN: 500.0000 [IU] | Freq: Once | INTRAVENOUS | Status: DC | PRN

## 2020-05-02 MED ORDER — SODIUM CHLORIDE 0.9 % IV SOLN
8.0000 mg | Freq: Three times a day (TID) | INTRAVENOUS | Status: DC | PRN
  Filled 2020-05-02: qty 4

## 2020-05-02 MED ORDER — SODIUM CHLORIDE 0.9% FLUSH: 3.0000 mL | INTRAVENOUS | Status: DC | PRN

## 2020-05-02 MED ORDER — ALUM & MAG HYDROXIDE-SIMETH 200-200-20 MG/5ML PO SUSP: 60.0000 mL | ORAL | Status: DC | PRN

## 2020-05-02 MED ORDER — ONDANSETRON 4 MG PO TBDP
4.0000 mg | ORAL_TABLET | Freq: Three times a day (TID) | ORAL | Status: DC | PRN
  Filled 2020-05-02: qty 2

## 2020-05-02 MED ORDER — PREDNISONE 20 MG PO TABS
60.0000 mg | ORAL_TABLET | Freq: Every day | ORAL | Status: AC
  Administered 2020-05-03 – 2020-05-07 (×5): 60 mg via ORAL
  Filled 2020-05-02 (×5): qty 3

## 2020-05-02 MED ORDER — SODIUM CHLORIDE 0.9 % IV SOLN
Freq: Once | INTRAVENOUS | Status: AC
  Administered 2020-05-04: 8 mg via INTRAVENOUS
  Filled 2020-05-02: qty 4

## 2020-05-02 MED ORDER — COLD PACK MISC ONCOLOGY
1.0000 | Freq: Once | Status: DC | PRN
  Filled 2020-05-02: qty 1

## 2020-05-02 MED ORDER — SODIUM CHLORIDE 0.9 % IV SOLN
Freq: Once | INTRAVENOUS | Status: AC
  Administered 2020-05-02: 55 mg via INTRAVENOUS
  Filled 2020-05-02: qty 4

## 2020-05-02 MED ORDER — ENOXAPARIN SODIUM 40 MG/0.4ML ~~LOC~~ SOLN
40.0000 mg | SUBCUTANEOUS | Status: DC
  Administered 2020-05-02 – 2020-05-05 (×4): 40 mg via SUBCUTANEOUS
  Filled 2020-05-02 (×5): qty 0.4

## 2020-05-02 MED ORDER — HEPARIN SOD (PORK) LOCK FLUSH 100 UNIT/ML IV SOLN
INTRAVENOUS | Status: AC
  Filled 2020-05-02: qty 5

## 2020-05-02 MED ORDER — SODIUM CHLORIDE 0.9 % IV SOLN: INTRAVENOUS | Status: DC

## 2020-05-02 MED ORDER — OXYCODONE HCL 5 MG PO TABS: 5.0000 mg | ORAL_TABLET | ORAL | Status: DC | PRN

## 2020-05-02 MED ORDER — HEPARIN SOD (PORK) LOCK FLUSH 100 UNIT/ML IV SOLN: 250.0000 [IU] | Freq: Once | INTRAVENOUS | Status: DC | PRN

## 2020-05-02 MED ORDER — SENNOSIDES-DOCUSATE SODIUM 8.6-50 MG PO TABS: 1.0000 | ORAL_TABLET | Freq: Every evening | ORAL | Status: DC | PRN

## 2020-05-02 MED ORDER — HYDROCODONE-HOMATROPINE 5-1.5 MG/5ML PO SYRP
5.0000 mL | ORAL_SOLUTION | Freq: Four times a day (QID) | ORAL | Status: DC | PRN
  Administered 2020-05-02 – 2020-05-06 (×5): 5 mL via ORAL
  Filled 2020-05-02 (×5): qty 5

## 2020-05-02 MED ORDER — HYDROCORTISONE (PERIANAL) 2.5 % EX CREA: 1.0000 "application " | TOPICAL_CREAM | Freq: Two times a day (BID) | CUTANEOUS | Status: DC | PRN

## 2020-05-02 MED ORDER — GUAIFENESIN-DM 100-10 MG/5ML PO SYRP: 10.0000 mL | ORAL_SOLUTION | ORAL | Status: DC | PRN

## 2020-05-02 NOTE — Procedures (Signed)
Interventional Radiology Procedure Note  Procedure: Right arm DL PowerPICC  Complications: None  Estimated Blood Loss: None  Recommendations: - Routine line care  Signed,  Criselda Peaches, MD

## 2020-05-02 NOTE — Telephone Encounter (Signed)
No los 9/30 

## 2020-05-02 NOTE — H&P (Addendum)
[place]  [phone] Fax:([phone] [place]  Reason for Admission: Cycle #1 EPOCH-R for primary mediastinal B-cell lymphoma  HPI: Travis Duran is a 44 year old male who was seen for initial consult during his hospitalization on 04/25/2020.  The patient had progressive shortness of breath over a 6-week period of time and a CT of the chest performed on 04/24/2020 showed a large malignant appearing left anterior mediastinal mass measuring 14.6 x 11.7 x 18.5 cm, large left pleural effusion, and small pericardial effusion.  He underwent several thoracenteses during his hospitalization as well as a CT-guided biopsy of the mediastinal mass.  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  Echocardiogram performed on 04/27/2020 showed a LVEF of 60 to 65%.  The patient had a PICC line placed in interventional radiology earlier today.  He was seen by Dr. Marin Olp on 05/01/2020 and recommendation was for the patient to begin chemotherapy as soon as possible with EPOCH-R.  PET scan will be scheduled at a later date as an outpatient and he will likely have Port-A-Cath placement in the future.  The patient just had his PICC line placed this morning.  He tolerated the procedure well.  The patient continues to have shortness of breath as well as a cough.  He has not had any fevers or chills.  Denies chest pain.  Denies abdominal pain, nausea, vomiting, constipation, diarrhea.  No bleeding reported.  The patient is seen today for admission for cycle #1 of EPOCH-R.    Past Medical History:  Diagnosis Date  . Arthritis of right knee   . ED (erectile dysfunction)   . Lymphoma, large cell, intrathoracic lymph nodes ([place]) 05/01/2020  . OSA (obstructive sleep apnea) 2007   with AHI 32/hr  :  Past Surgical History:  Procedure  Laterality Date  . burn to left hand     skin graphs  . FINE NEEDLE ASPIRATION  04/25/2020   Procedure: FINE NEEDLE ASPIRATION (FNA) LINEAR;  Surgeon: Candee Furbish, MD;  Location: [facility] ENDOSCOPY;  Service: Pulmonary;;  . IR THORACENTESIS ASP PLEURAL SPACE W/IMG GUIDE  04/25/2020  . REFRACTIVE SURGERY  09/2012   bilateral   . THORACENTESIS  04/25/2020   Procedure: THORACENTESIS;  Surgeon: Candee Furbish, MD;  Location: [facility] ENDOSCOPY;  Service: Pulmonary;;  . VIDEO BRONCHOSCOPY WITH ENDOBRONCHIAL ULTRASOUND N/A 04/25/2020   Procedure: VIDEO BRONCHOSCOPY WITH ENDOBRONCHIAL ULTRASOUND;  Surgeon: Candee Furbish, MD;  Location: [facility] ENDOSCOPY;  Service: Pulmonary;  Laterality: N/A;  :  Current Facility-Administered Medications  Medication Dose Route Frequency Provider Last Rate Last Admin  . 0.9 %  sodium chloride infusion   Intravenous Continuous Volanda Napoleon, MD 50 mL/hr at 05/02/20 1115 New Bag at 05/02/20 1115  . acetaminophen (TYLENOL) tablet 650 mg  650 mg Oral Q4H PRN Volanda Napoleon, MD      . alum & mag hydroxide-simeth (MAALOX/MYLANTA) 200-200-20 MG/5ML suspension 60 mL  60 mL Oral Q4H PRN Volanda Napoleon, MD      . bisacodyl (DULCOLAX) EC tablet 5 mg  5 mg Oral Daily PRN Volanda Napoleon, MD      . enoxaparin (LOVENOX) injection 40 mg  40 mg Subcutaneous Q24H Eddy Termine, Rudell Cobb, MD      . guaiFENesin-dextromethorphan (ROBITUSSIN DM) 100-10 MG/5ML syrup 10 mL  10 mL Oral Q4H PRN Volanda Napoleon, MD      .  HYDROcodone-homatropine (HYCODAN) 5-1.5 MG/5ML syrup 5 mL  5 mL Oral Q6H PRN Curcio, Roselie Awkward, NP      . hydrocortisone (ANUSOL-HC) 2.5 % rectal cream 1 application  1 application Rectal BID PRN Volanda Napoleon, MD      . ondansetron (ZOFRAN) tablet 4-8 mg  4-8 mg Oral Q8H PRN Volanda Napoleon, MD       Or  . ondansetron (ZOFRAN-ODT) disintegrating tablet 4-8 mg  4-8 mg Oral Q8H PRN Volanda Napoleon, MD       Or  . ondansetron (ZOFRAN) injection 4 mg  4 mg Intravenous Q8H PRN  Volanda Napoleon, MD       Or  . ondansetron (ZOFRAN) 8 mg in sodium chloride 0.9 % 50 mL IVPB  8 mg Intravenous Q8H PRN Volanda Napoleon, MD      . oxyCODONE (Oxy IR/ROXICODONE) immediate release tablet 5 mg  5 mg Oral Q4H PRN Volanda Napoleon, MD      . senna-docusate (Senokot-S) tablet 1 tablet  1 tablet Oral QHS PRN Volanda Napoleon, MD       Facility-Administered Medications Ordered in Other Encounters  Medication Dose Route Frequency Provider Last Rate Last Admin  . heparin lock flush 100 UNIT/ML injection           . lidocaine (XYLOCAINE) 1 % (with pres) injection              No Known Allergies:  Family History  Problem Relation Age of Onset  . Sleep apnea Other   . Hypertension Other   . Emphysema Father   . Hypertension Father   :  Social History   Socioeconomic History  . Marital status: Married    Spouse name: Not on file  . Number of children: 0  . Years of education: Not on file  . Highest education level: Not on file  Occupational History  . Not on file  Tobacco Use  . Smoking status: Never Smoker  . Smokeless tobacco: Never Used  Vaping Use  . Vaping Use: Never used  Substance and Sexual Activity  . Alcohol use: Yes    Alcohol/week: 0.0 standard drinks    Comment: occ  . Drug use: No  . Sexual activity: Yes    Partners: Female  Other Topics Concern  . Not on file  Social History Narrative  . Not on file   Social Determinants of Health   Financial Resource Strain:   . Difficulty of Paying Living Expenses: Not on file  Food Insecurity:   . Worried About Charity fundraiser in the Last Year: Not on file  . Ran Out of Food in the Last Year: Not on file  Transportation Needs:   . Lack of Transportation (Medical): Not on file  . Lack of Transportation (Non-Medical): Not on file  Physical Activity:   . Days of Exercise per Week: Not on file  . Minutes of Exercise per Session: Not on file  Stress:   . Feeling of Stress : Not on file  Social  Connections:   . Frequency of Communication with Friends and Family: Not on file  . Frequency of Social Gatherings with Friends and Family: Not on file  . Attends Religious Services: Not on file  . Active Member of Clubs or Organizations: Not on file  . Attends Archivist Meetings: Not on file  . Marital Status: Not on file  Intimate Partner Violence:   . Fear of  Current or Ex-Partner: Not on file  . Emotionally Abused: Not on file  . Physically Abused: Not on file  . Sexually Abused: Not on file  :  Review of Systems: A comprehensive 14 point review of systems was negative except as noted in the HPI.  Exam: Patient Vitals for the past 24 hrs:  BP Temp Temp src Pulse Resp SpO2 Height Weight  05/02/20 1030 119/88 98.3 F (36.8 C) Oral (!) 107 14 96 % 5\' 9"  (1.753 m) 90.7 kg    General:  well-nourished in no acute distress.   Eyes:  no scleral icterus.   ENT:  There were no oropharyngeal lesions.   Neck was without thyromegaly.   Lymphatics:  Negative cervical, supraclavicular or axillary adenopathy.   Respiratory: Diminished on the left, otherwise clear today Cardiovascular:  Regular rate and rhythm, S1/S2, without murmur, rub or gallop.  There was no pedal edema.   GI:  abdomen was soft, flat, nontender, nondistended, without organomegaly.   Musculoskeletal: Strength symmetrical in the upper and lower extremities. Skin exam was without echymosis, petichae.   Neuro exam was nonfocal. Patient was alert and oriented.  Attention was good.   Language was appropriate.  Mood was normal without depression.  Speech was not pressured.  Thought content was not tangential.     Lab Results  Component Value Date   WBC 11.4 (H) 05/01/2020   HGB 14.1 05/01/2020   HCT 44.0 05/01/2020   PLT 458 (H) 05/01/2020   GLUCOSE 96 05/01/2020   CHOL 170 04/06/2019   TRIG 58.0 04/06/2019   HDL 43.20 04/06/2019   LDLCALC 115 (H) 04/06/2019   ALT 74 (H) 05/01/2020   AST 34 05/01/2020   NA  138 05/01/2020   K 4.1 05/01/2020   CL 100 05/01/2020   CREATININE 1.23 05/01/2020   BUN 16 05/01/2020   CO2 30 05/01/2020    DG Chest 1 View  Result Date: 04/25/2020 CLINICAL DATA:  Post video bronchoscopy. EXAM: CHEST  1 VIEW COMPARISON:  Chest x-ray dated 04/25/2020 FINDINGS: There is some improved aeration in the left zone however, there is persistent diffuse opacification of the left hemithorax. The cardiac silhouette is difficult to fully evaluate on this study but appears similar to prior study. A large mediastinal mass is again noted. There is no pneumothorax. There is a persistent at least moderate-sized left-sided pleural effusion. IMPRESSION: 1. No pneumothorax. 2. Persistent diffuse opacification of the left hemithorax. 3. Persistent large mediastinal mass. 4. Persistent at least moderate-sized left-sided pleural effusion. Electronically Signed   By: Constance Holster M.D.   On: 04/25/2020 16:58   DG Chest 1 View  Result Date: 04/25/2020 CLINICAL DATA:  Shortness of breath.  Status post thoracentesis EXAM: CHEST  1 VIEW COMPARISON:  Chest CT April 24, 2020. FINDINGS: No pneumothorax. Large partially loculated pleural effusion remains on the left. Opacity more medially on the left may well represent a large mass. A degree of atelectasis and consolidation in association with apparent mass cannot be excluded. Smaller pleural effusions seen on the right on chest CT from 1 day prior is not appreciable. There is mild atelectasis in the medial right base. Right lung otherwise is clear. Heart appears mildly enlarged. There is no longer appreciable impression on the heart as was noted on CT from 1 day prior. Pulmonary vascularity appears grossly normal. No adenopathy. No bone lesions. IMPRESSION: Persistent sizable pleural effusion on the left although smaller than on CT examination from 1 day prior. The  heart no longer appears significantly compressed as was noted on CT from 1 day prior. Mass  seen on CT medial to the effusion persists. There may be associated atelectasis and consolidation in this area. Right lung appears clear except for atelectasis in the medial right base. Heart appears mildly enlarged. Electronically Signed   By: Lowella Grip III M.D.   On: 04/25/2020 09:53   DG Chest 2 View  Result Date: 04/04/2020 CLINICAL DATA:  Chronic cough EXAM: CHEST - 2 VIEW COMPARISON:  None. FINDINGS: Frontal and lateral views of the chest demonstrate an unremarkable cardiac silhouette. Increased density within the lower 2/3 of the left hemithorax compatible with consolidation and effusion. Right chest is clear. No pneumothorax. No acute bony abnormalities. IMPRESSION: 1. Increased density left hemithorax consistent with left lung consolidation and effusion. If the patient has clinical signs and symptoms of pneumonia, Followup PA and lateral chest X-ray is recommended in 3-4 weeks following trial of antibiotic therapy to ensure resolution and exclude underlying malignancy. Otherwise, CT chest with IV contrast may be useful. These results will be called to the ordering clinician or representative by the Radiologist Assistant, and communication documented in the PACS or [employer]. Electronically Signed   By: Randa Ngo M.D.   On: 04/04/2020 03:51   CT Chest W Contrast  Result Date: 04/24/2020 CLINICAL DATA:  Left pleural effusion. Dyspnea with exertion for 1 month. Chronic cough. EXAM: CT CHEST WITH CONTRAST TECHNIQUE: Multidetector CT imaging of the chest was performed during intravenous contrast administration. CONTRAST:  68mL ISOVUE-300 IOPAMIDOL (ISOVUE-300) INJECTION 61% COMPARISON:  04/03/2020 chest radiograph. FINDINGS: Cardiovascular: Normal heart size. Small pericardial effusion with suggestion of mild pericardial thickening and enhancement. Great vessels are normal in course and caliber. No central pulmonary emboli. Mediastinum/Nodes: Mildly heterogeneous thyroid gland without  discrete thyroid nodules. Unremarkable esophagus. No axillary adenopathy. Large heterogeneously enhancing poorly marginated solid 14.6 x 11.7 x 18.5 cm left anterior mediastinal mass (series 2/image 62) with prominent mass-effect on the right shifted great vessels and heart, extending superiorly nearly to the thoracic inlet and inferiorly to the left pericardiophrenic region, contiguous with the left hilum and potentially directly invading the left upper lung. No discrete hilar adenopathy. Lungs/Pleura: No pneumothorax. Small dependent right pleural effusion. Large left pleural effusion. Widespread enhancing left pleural nodularity, for example measuring 2.6 cm at the anterior left apex (series 2/image 33) and 2.8 cm posteriorly and inferiorly in the left pleural space (series 2/image 131). Complete left lower lobe and near complete left upper lobe atelectasis. No additional significant pulmonary nodules. Upper abdomen: No acute abnormality. Musculoskeletal:  No aggressive appearing focal osseous lesions. IMPRESSION: 1. 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 Differential considerations include primary thymic malignancy, primary bronchogenic carcinoma or less likely lymphoma. 2. Widespread enhancing left pleural nodularity compatible with pleural metastatic disease. 3. Large left pleural effusion with near complete left lung atelectasis. 4. Small dependent right pleural effusion. 5. Small pericardial effusion with suggestion of mild pericardial thickening and enhancement, cannot exclude malignant pericardial effusion. These results were called by telephone at the time of  interpretation on 04/24/2020 at 2:41 pm to Yoe, RN in the office of provider Alysia Penna, who verbally acknowledged these  results. The patient will be transported to the ED via ambulance from the imaging center for further evaluation. Electronically Signed   By: Ilona Sorrel M.D.   On: 04/24/2020 15:07   CT ABDOMEN PELVIS W CONTRAST  Result Date: 04/26/2020 CLINICAL DATA:  Newly diagnosed non-small cell lung carcinoma. Staging. EXAM: CT ABDOMEN AND PELVIS WITH CONTRAST TECHNIQUE: Multidetector CT imaging of the abdomen and pelvis was performed using the standard protocol following bolus administration of intravenous contrast. CONTRAST:  131mL OMNIPAQUE IOHEXOL 300 MG/ML  SOLN COMPARISON:  Chest CT on 04/24/2020 FINDINGS: Lower Chest: Large anterior mediastinal mass and small bilateral pleural effusions are again seen, as better visualized on recent chest CT. Hepatobiliary: A 2 cm hypovascular lesion is seen in the posterior right hepatic lobe, which has nonspecific features. No other liver masses are identified. Gallbladder is unremarkable. No evidence of biliary ductal dilatation. Pancreas:  No mass or inflammatory changes. Spleen: Within normal limits in size and appearance. Adrenals/Urinary Tract: No masses identified. No evidence of ureteral calculi or hydronephrosis. Stomach/Bowel: No evidence of obstruction, inflammatory process or abnormal fluid collections. Vascular/Lymphatic: No pathologically enlarged lymph nodes. No abdominal aortic aneurysm. Reproductive:  No mass or other significant abnormality. Other:  None. Musculoskeletal:  No suspicious bone lesions identified. IMPRESSION: 2 cm nonspecific hypovascular lesion in posterior right hepatic lobe. Recommend abdomen MRI without and with contrast for further characterization. No other sites of metastatic disease identified within the abdomen or pelvis. Large anterior mediastinal mass and bilateral pleural effusions, as better demonstrated on  recent chest CT. Electronically Signed   By: Marlaine Hind M.D.   On: 04/26/2020 14:15   CT BIOPSY  Result Date: 04/28/2020 INDICATION: 44 year old male with large anterior mediastinal mass suspected lymphoma. Core biopsy requested for additional tissue for diagnostic purposes. EXAM: CT BIOPSY COMPARISON:  None. MEDICATIONS: None. ANESTHESIA/SEDATION: Fentanyl 2 mcg IV; Versed 50 mg IV Sedation time: 26 minutes; The patient was continuously monitored during the procedure by the interventional radiology nurse under my direct supervision. CONTRAST:  None. COMPLICATIONS: None immediate. PROCEDURE: Informed consent was obtained from the patient following an explanation of the procedure, risks, benefits and alternatives. A time out was performed prior to the initiation of the procedure. The patient was positioned supine on the CT table and a limited CT was performed for procedural planning demonstrating similar appearance of large anterior mediastinal soft tissue mass. The procedure was planned. The operative site was prepped and draped in the usual sterile fashion. Appropriate trajectory was confirmed with a 22 gauge spinal needle after the adjacent tissues were anesthetized with 1% Lidocaine with epinephrine. Under intermittent CT guidance, a 17 gauge coaxial needle was advanced into the peripheral aspect of the mass. Appropriate positioning was confirmed and a total of 5 samples were obtained with an 18 gauge core needle biopsy device. The co-axial needle was removed while simultaneously injecting a Gel-Foam slurry and hemostasis was achieved with manual compression. A limited postprocedural CT was negative for hemorrhage or additional complication. A dressing was placed. The patient tolerated the procedure well without immediate postprocedural complication. IMPRESSION: Technically successful CT guided core needle biopsy of anterior mediastinal mass. Electronically Signed   By: Ruthann Cancer MD   On: 04/28/2020  16:38   DG Chest Port 1 View  Result Date: 04/26/2020 CLINICAL DATA:  Status post thoracentesis. EXAM: PORTABLE CHEST 1 VIEW COMPARISON:  Radiograph yesterday.  Chest CT 04/24/2020 FINDINGS: Decreased size of left pleural effusion after thoracentesis with increasing aeration decreasing hazy opacity. There  is persistent blunting of the medial and lateral costophrenic angles and loculated fluid tracking at the apex. No definite pneumothorax. Slight residual rightward tracheal deviation. Heart size and mediastinal contours are obscured, however well-defined density in the left hemithorax likely corresponds to anterior mediastinal mass on chest CT. The right lung is clear. IMPRESSION: 1. Decreased size of left pleural effusion after thoracentesis with increasing aeration in the left hemithorax and decreasing hazy opacity. No definite pneumothorax. 2. Small volume of persistent partially loculated left pleural fluid. Electronically Signed   By: Keith Rake M.D.   On: 04/26/2020 15:38   MR ABDOMEN MRCP W WO CONTAST  Result Date: 04/27/2020 CLINICAL DATA:  Non-small cell lung carcinoma. Indeterminate liver lesion on recent CT. Staging. EXAM: MRI ABDOMEN WITHOUT AND WITH CONTRAST (INCLUDING MRCP) TECHNIQUE: Multiplanar multisequence MR imaging of the abdomen was performed both before and after the administration of intravenous contrast. Heavily T2-weighted images of the biliary and pancreatic ducts were obtained, and three-dimensional MRCP images were rendered by post processing. CONTRAST:  63mL GADAVIST GADOBUTROL 1 MMOL/ML IV SOLN COMPARISON:  CT on 04/26/2020 FINDINGS: Lower chest: Large anterior mediastinal mass and malignant left pleural effusion with numerous pleural based soft tissue masses, as better demonstrated on recent chest CT. Hepatobiliary: Image degradation by motion artifact noted. A solitary mildly complex cystic lesion is seen in the posterior right hepatic lobe which measures 1.6 cm. This  shows a mildly lobulated contour and a few peripheral septations, but does not have a solid or enhancing component, and does not have typical features of a metastasis. No other liver lesions are identified. Gallbladder is unremarkable. No evidence of biliary ductal dilatation. Pancreas:  No mass or inflammatory changes. Spleen:  Within normal limits in size and appearance. Adrenals/Urinary Tract: No adrenal or renal masses identified. No evidence of hydronephrosis. Stomach/Bowel: Visualized portion unremarkable. Vascular/Lymphatic: No pathologically enlarged lymph nodes identified. No abdominal aortic aneurysm. Other:  None. Musculoskeletal:  No suspicious bone lesions identified. IMPRESSION: 1.6 cm complex cyst in the posterior right hepatic lobe. No evidence of hepatic or other abdominal metastatic disease. Large anterior mediastinal mass and malignant left pleural effusion , as better demonstrated on recent chest CT. Electronically Signed   By: Marlaine Hind M.D.   On: 04/27/2020 08:26   ECHOCARDIOGRAM COMPLETE  Result Date: 04/27/2020    ECHOCARDIOGRAM REPORT   Patient Name:   Travis Duran Date of Exam: 04/27/2020 Medical Rec #:  [id number]    Height:       69.0 in Accession #:    [id number]   Weight:       210.3 lb Date of Birth:  09-14-1975     BSA:          2.111 m Patient Age:    1 years     BP:           117/80 mmHg Patient Gender: M            HR:           105 bpm. Exam Location:  Inpatient Procedure: 2D Echo, Color Doppler, Cardiac Doppler and Strain Analysis Indications:    Pre-Chemo Evaluation  History:        Patient has no prior history of Echocardiogram examinations.                 Risk Factors:Sleep Apnea. Mediastinal mass.  Sonographer:    Raquel Sarna Senior RDCS Referring Phys: Bunk Foss Comments: Technically challenging due to shadowing  from mass and shifted heart position in chest. IMPRESSIONS  1. Left ventricular ejection fraction, by estimation, is 60 to 65%. The left  ventricle has normal function. The left ventricle has no regional wall motion abnormalities. Left ventricular diastolic parameters are consistent with Grade I diastolic dysfunction (impaired relaxation). The average left ventricular global longitudinal strain is -17.3 %. The global longitudinal strain is normal.  2. Right ventricular systolic function is normal. The right ventricular size is normal.  3. The mitral valve is normal in structure. No evidence of mitral valve regurgitation. No evidence of mitral stenosis.  4. The aortic valve is tricuspid. Aortic valve regurgitation is not visualized. No aortic stenosis is present.  5. The inferior vena cava is dilated in size with <50% respiratory variability, suggesting right atrial pressure of 15 mmHg. FINDINGS  Left Ventricle: Left ventricular ejection fraction, by estimation, is 60 to 65%. The left ventricle has normal function. The left ventricle has no regional wall motion abnormalities. The average left ventricular global longitudinal strain is -17.3 %. The global longitudinal strain is normal. The left ventricular internal cavity size was normal in size. There is no left ventricular hypertrophy. Left ventricular diastolic parameters are consistent with Grade I diastolic dysfunction (impaired relaxation). Right Ventricle: The right ventricular size is normal. No increase in right ventricular wall thickness. Right ventricular systolic function is normal. Left Atrium: Left atrial size was normal in size. Right Atrium: Right atrial size was normal in size. Pericardium: There is no evidence of pericardial effusion. Mitral Valve: The mitral valve is normal in structure. No evidence of mitral valve regurgitation. No evidence of mitral valve stenosis. Tricuspid Valve: The tricuspid valve is normal in structure. Tricuspid valve regurgitation is not demonstrated. No evidence of tricuspid stenosis. Aortic Valve: The aortic valve is tricuspid. Aortic valve regurgitation is  not visualized. No aortic stenosis is present. Pulmonic Valve: The pulmonic valve was normal in structure. Pulmonic valve regurgitation is not visualized. No evidence of pulmonic stenosis. Aorta: The aortic root is normal in size and structure. Venous: The inferior vena cava is dilated in size with less than 50% respiratory variability, suggesting right atrial pressure of 15 mmHg. IAS/Shunts: No atrial level shunt detected by color flow Doppler. Additional Comments: Mass noted anterior to the right ventricle and apex.  LEFT VENTRICLE PLAX 2D LVOT diam:     2.20 cm LV SV:         65       2D Longitudinal Strain LV SV Index:   31       2D Strain GLS (A2C):   -15.8 % LVOT Area:     3.80 cm 2D Strain GLS (A3C):   -18.4 %                         2D Strain GLS (A4C):   -17.8 %                         2D Strain GLS Avg:     -17.3 % RIGHT VENTRICLE RV S prime:     10.00 cm/s TAPSE (M-mode): 1.5 cm LEFT ATRIUM             Index       RIGHT ATRIUM           Index LA Vol (A2C):   25.9 ml 12.27 ml/m RA Area:     13.40 cm LA Vol (A4C):   17.6  ml 8.34 ml/m  RA Volume:   32.00 ml  15.16 ml/m LA Biplane Vol: 21.9 ml 10.38 ml/m  AORTIC VALVE LVOT Vmax:   97.30 cm/s LVOT Vmean:  74.600 cm/s LVOT VTI:    0.170 m  AORTA Ao Root diam: 3.30 cm Ao Asc diam:  3.30 cm  SHUNTS Systemic VTI:  0.17 m Systemic Diam: 2.20 cm Skeet Latch MD Electronically signed by Skeet Latch MD Signature Date/Time: 04/27/2020/12:40:00 PM    Final    IR PICC PLACEMENT RIGHT <5 YRS INC IMG GUIDE  Result Date: 05/02/2020 INDICATION: 44 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 For this, he requires durable venous access and presents for PICC placement. EXAM: PICC LINE PLACEMENT WITH ULTRASOUND AND FLUOROSCOPIC GUIDANCE MEDICATIONS: None. ANESTHESIA/SEDATION: None. FLUOROSCOPY TIME:  Fluoroscopy Time: 0 minutes 24 seconds (7 mGy). COMPLICATIONS: None immediate. PROCEDURE: The patient was advised of the  possible risks and complications and agreed to undergo the procedure. The patient was then brought to the angiographic suite for the procedure. The right/left arm was prepped with chlorhexidine, draped in the usual sterile fashion using maximum barrier technique (cap and mask, sterile gown, sterile gloves, large sterile sheet, hand hygiene and cutaneous antisepsis) and infiltrated locally with 1% Lidocaine. Ultrasound demonstrated patency of the right basilic vein, and this was documented with an image. Under real-time ultrasound guidance, this vein was accessed with a 21 gauge micropuncture needle and image documentation was performed. A 0.018 wire was introduced in to the vein. Over this, a 6 [place] dual lumen power injectable PICC was advanced to the lower SVC/right atrial junction. Fluoroscopy during the procedure and fluoro spot radiograph confirms appropriate catheter position. The catheter was flushed and covered with sterile dressing. Catheter length: 42 cm IMPRESSION: Successful right arm power PICC line placement with ultrasound and fluoroscopic guidance. The catheter is ready for use. Electronically Signed   By: Jacqulynn Cadet M.D.   On: 05/02/2020 08:52   IR THORACENTESIS ASP PLEURAL SPACE W/IMG GUIDE  Result Date: 04/25/2020 INDICATION: Patient with newly diagnosed lung mass with large left pleural effusion presents today for a therapeutic and diagnostic thoracentesis. EXAM: ULTRASOUND GUIDED THORACENTESIS MEDICATIONS: 1% lidocaine 10 COMPLICATIONS: None immediate. PROCEDURE: An ultrasound guided thoracentesis was thoroughly discussed with the patient and questions answered. The benefits, risks, alternatives and complications were also discussed. The patient understands and wishes to proceed with the procedure. Written consent was obtained. Ultrasound was performed to localize and mark an adequate pocket of fluid in the left chest. The area was then prepped and draped in the normal sterile  fashion. 1% Lidocaine was used for local anesthesia. Under ultrasound guidance a 6 Fr Safe-T-Centesis catheter was introduced. Thoracentesis was performed. The catheter was removed and a dressing applied. FINDINGS: A total of approximately 1.8 L of dark yellow fluid was removed. Samples were sent to the laboratory as requested by the clinical team. IMPRESSION: Successful ultrasound guided left thoracentesis yielding 1.8 L of pleural fluid. Read by: Soyla Dryer, NP Electronically Signed   By: Jacqulynn Cadet M.D.   On: 04/25/2020 10:03     DG Chest 1 View  Result Date: 04/25/2020 CLINICAL DATA:  Post video bronchoscopy. EXAM: CHEST  1 VIEW COMPARISON:  Chest x-ray dated 04/25/2020 FINDINGS: There is some improved aeration in the left zone however, there is persistent diffuse opacification of the left hemithorax. The cardiac silhouette is difficult to fully evaluate on this study but appears similar to prior study. A large mediastinal  mass is again noted. There is no pneumothorax. There is a persistent at least moderate-sized left-sided pleural effusion. IMPRESSION: 1. No pneumothorax. 2. Persistent diffuse opacification of the left hemithorax. 3. Persistent large mediastinal mass. 4. Persistent at least moderate-sized left-sided pleural effusion. Electronically Signed   By: Constance Holster M.D.   On: 04/25/2020 16:58   DG Chest 1 View  Result Date: 04/25/2020 CLINICAL DATA:  Shortness of breath.  Status post thoracentesis EXAM: CHEST  1 VIEW COMPARISON:  Chest CT April 24, 2020. FINDINGS: No pneumothorax. Large partially loculated pleural effusion remains on the left. Opacity more medially on the left may well represent a large mass. A degree of atelectasis and consolidation in association with apparent mass cannot be excluded. Smaller pleural effusions seen on the right on chest CT from 1 day prior is not appreciable. There is mild atelectasis in the medial right base. Right lung otherwise is  clear. Heart appears mildly enlarged. There is no longer appreciable impression on the heart as was noted on CT from 1 day prior. Pulmonary vascularity appears grossly normal. No adenopathy. No bone lesions. IMPRESSION: Persistent sizable pleural effusion on the left although smaller than on CT examination from 1 day prior. The heart no longer appears significantly compressed as was noted on CT from 1 day prior. Mass seen on CT medial to the effusion persists. There may be associated atelectasis and consolidation in this area. Right lung appears clear except for atelectasis in the medial right base. Heart appears mildly enlarged. Electronically Signed   By: Lowella Grip III M.D.   On: 04/25/2020 09:53   DG Chest 2 View  Result Date: 04/04/2020 CLINICAL DATA:  Chronic cough EXAM: CHEST - 2 VIEW COMPARISON:  None. FINDINGS: Frontal and lateral views of the chest demonstrate an unremarkable cardiac silhouette. Increased density within the lower 2/3 of the left hemithorax compatible with consolidation and effusion. Right chest is clear. No pneumothorax. No acute bony abnormalities. IMPRESSION: 1. Increased density left hemithorax consistent with left lung consolidation and effusion. If the patient has clinical signs and symptoms of pneumonia, Followup PA and lateral chest X-ray is recommended in 3-4 weeks following trial of antibiotic therapy to ensure resolution and exclude underlying malignancy. Otherwise, CT chest with IV contrast may be useful. These results will be called to the ordering clinician or representative by the Radiologist Assistant, and communication documented in the PACS or [employer]. Electronically Signed   By: Randa Ngo M.D.   On: 04/04/2020 03:51   CT Chest W Contrast  Result Date: 04/24/2020 CLINICAL DATA:  Left pleural effusion. Dyspnea with exertion for 1 month. Chronic cough. EXAM: CT CHEST WITH CONTRAST TECHNIQUE: Multidetector CT imaging of the chest was performed  during intravenous contrast administration. CONTRAST:  75mL ISOVUE-300 IOPAMIDOL (ISOVUE-300) INJECTION 61% COMPARISON:  04/03/2020 chest radiograph. FINDINGS: Cardiovascular: Normal heart size. Small pericardial effusion with suggestion of mild pericardial thickening and enhancement. Great vessels are normal in course and caliber. No central pulmonary emboli. Mediastinum/Nodes: Mildly heterogeneous thyroid gland without discrete thyroid nodules. Unremarkable esophagus. No axillary adenopathy. Large heterogeneously enhancing poorly marginated solid 14.6 x 11.7 x 18.5 cm left anterior mediastinal mass (series 2/image 62) with prominent mass-effect on the right shifted great vessels and heart, extending superiorly nearly to the thoracic inlet and inferiorly to the left pericardiophrenic region, contiguous with the left hilum and potentially directly invading the left upper lung. No discrete hilar adenopathy. Lungs/Pleura: No pneumothorax. Small dependent right pleural effusion. Large left pleural effusion.  Widespread enhancing left pleural nodularity, for example measuring 2.6 cm at the anterior left apex (series 2/image 33) and 2.8 cm posteriorly and inferiorly in the left pleural space (series 2/image 131). Complete left lower lobe and near complete left upper lobe atelectasis. No additional significant pulmonary nodules. Upper abdomen: No acute abnormality. Musculoskeletal:  No aggressive appearing focal osseous lesions. IMPRESSION: 1. 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 Differential considerations include primary thymic malignancy, primary bronchogenic carcinoma or  less likely lymphoma. 2. Widespread enhancing left pleural nodularity compatible with pleural metastatic disease. 3. Large left pleural effusion with near complete left lung atelectasis. 4. Small dependent right pleural effusion. 5. Small pericardial effusion with suggestion of mild pericardial thickening and enhancement, cannot exclude malignant pericardial effusion. These results were called by telephone at the time of interpretation on 04/24/2020 at 2:41 pm to [place], RN in the office of provider Alysia Penna, who verbally acknowledged these results. The patient will be transported to the ED via ambulance from the imaging center for further evaluation. Electronically Signed   By: Ilona Sorrel M.D.   On: 04/24/2020 15:07   CT ABDOMEN PELVIS W CONTRAST  Result Date: 04/26/2020 CLINICAL DATA:  Newly diagnosed non-small cell lung carcinoma. Staging. EXAM: CT ABDOMEN AND PELVIS WITH CONTRAST TECHNIQUE: Multidetector CT imaging of the abdomen and pelvis was performed using the standard protocol following bolus administration of intravenous contrast. CONTRAST:  15mL OMNIPAQUE IOHEXOL 300 MG/ML  SOLN COMPARISON:  Chest CT on 04/24/2020 FINDINGS: Lower Chest: Large anterior mediastinal mass and small bilateral pleural effusions are again seen, as better visualized on recent chest CT. Hepatobiliary: A 2 cm hypovascular lesion is seen in the posterior right hepatic lobe, which has nonspecific features. No other liver masses are identified. Gallbladder is unremarkable. No evidence of biliary ductal dilatation. Pancreas:  No mass or inflammatory changes. Spleen: Within normal limits in size and appearance. Adrenals/Urinary Tract: No masses identified. No evidence of ureteral calculi or hydronephrosis. Stomach/Bowel: No evidence of obstruction, inflammatory process or abnormal fluid collections. Vascular/Lymphatic: No pathologically enlarged lymph nodes. No abdominal aortic aneurysm. Reproductive:  No mass or other  significant abnormality. Other:  None. Musculoskeletal:  No suspicious bone lesions identified. IMPRESSION: 2 cm nonspecific hypovascular lesion in posterior right hepatic lobe. Recommend abdomen MRI without and with contrast for further characterization. No other sites of metastatic disease identified within the abdomen or pelvis. Large anterior mediastinal mass and bilateral pleural effusions, as better demonstrated on recent chest CT. Electronically Signed   By: Marlaine Hind M.D.   On: 04/26/2020 14:15   CT BIOPSY  Result Date: 04/28/2020 INDICATION: 44 year old male with large anterior mediastinal mass suspected lymphoma. Core biopsy requested for additional tissue for diagnostic purposes. EXAM: CT BIOPSY COMPARISON:  None. MEDICATIONS: None. ANESTHESIA/SEDATION: Fentanyl 2 mcg IV; Versed 50 mg IV Sedation time: 26 minutes; The patient was continuously monitored during the procedure by the interventional radiology nurse under my direct supervision. CONTRAST:  None. COMPLICATIONS: None immediate. PROCEDURE: Informed consent was obtained from the patient following an explanation of the procedure, risks, benefits and alternatives. A time out was performed prior to the initiation of the procedure. The patient was positioned supine on the CT table and  a limited CT was performed for procedural planning demonstrating similar appearance of large anterior mediastinal soft tissue mass. The procedure was planned. The operative site was prepped and draped in the usual sterile fashion. Appropriate trajectory was confirmed with a 22 gauge spinal needle after the adjacent tissues were anesthetized with 1% Lidocaine with epinephrine. Under intermittent CT guidance, a 17 gauge coaxial needle was advanced into the peripheral aspect of the mass. Appropriate positioning was confirmed and a total of 5 samples were obtained with an 18 gauge core needle biopsy device. The co-axial needle was removed while simultaneously injecting  a Gel-Foam slurry and hemostasis was achieved with manual compression. A limited postprocedural CT was negative for hemorrhage or additional complication. A dressing was placed. The patient tolerated the procedure well without immediate postprocedural complication. IMPRESSION: Technically successful CT guided core needle biopsy of anterior mediastinal mass. Electronically Signed   By: Ruthann Cancer MD   On: 04/28/2020 16:38   DG Chest Port 1 View  Result Date: 04/26/2020 CLINICAL DATA:  Status post thoracentesis. EXAM: PORTABLE CHEST 1 VIEW COMPARISON:  Radiograph yesterday.  Chest CT 04/24/2020 FINDINGS: Decreased size of left pleural effusion after thoracentesis with increasing aeration decreasing hazy opacity. There is persistent blunting of the medial and lateral costophrenic angles and loculated fluid tracking at the apex. No definite pneumothorax. Slight residual rightward tracheal deviation. Heart size and mediastinal contours are obscured, however well-defined density in the left hemithorax likely corresponds to anterior mediastinal mass on chest CT. The right lung is clear. IMPRESSION: 1. Decreased size of left pleural effusion after thoracentesis with increasing aeration in the left hemithorax and decreasing hazy opacity. No definite pneumothorax. 2. Small volume of persistent partially loculated left pleural fluid. Electronically Signed   By: Keith Rake M.D.   On: 04/26/2020 15:38   MR ABDOMEN MRCP W WO CONTAST  Result Date: 04/27/2020 CLINICAL DATA:  Non-small cell lung carcinoma. Indeterminate liver lesion on recent CT. Staging. EXAM: MRI ABDOMEN WITHOUT AND WITH CONTRAST (INCLUDING MRCP) TECHNIQUE: Multiplanar multisequence MR imaging of the abdomen was performed both before and after the administration of intravenous contrast. Heavily T2-weighted images of the biliary and pancreatic ducts were obtained, and three-dimensional MRCP images were rendered by post processing. CONTRAST:  10mL  GADAVIST GADOBUTROL 1 MMOL/ML IV SOLN COMPARISON:  CT on 04/26/2020 FINDINGS: Lower chest: Large anterior mediastinal mass and malignant left pleural effusion with numerous pleural based soft tissue masses, as better demonstrated on recent chest CT. Hepatobiliary: Image degradation by motion artifact noted. A solitary mildly complex cystic lesion is seen in the posterior right hepatic lobe which measures 1.6 cm. This shows a mildly lobulated contour and a few peripheral septations, but does not have a solid or enhancing component, and does not have typical features of a metastasis. No other liver lesions are identified. Gallbladder is unremarkable. No evidence of biliary ductal dilatation. Pancreas:  No mass or inflammatory changes. Spleen:  Within normal limits in size and appearance. Adrenals/Urinary Tract: No adrenal or renal masses identified. No evidence of hydronephrosis. Stomach/Bowel: Visualized portion unremarkable. Vascular/Lymphatic: No pathologically enlarged lymph nodes identified. No abdominal aortic aneurysm. Other:  None. Musculoskeletal:  No suspicious bone lesions identified. IMPRESSION: 1.6 cm complex cyst in the posterior right hepatic lobe. No evidence of hepatic or other abdominal metastatic disease. Large anterior mediastinal mass and malignant left pleural effusion , as better demonstrated on recent chest CT. Electronically Signed   By: Marlaine Hind M.D.   On: 04/27/2020 08:26  ECHOCARDIOGRAM COMPLETE  Result Date: 04/27/2020    ECHOCARDIOGRAM REPORT   Patient Name:   Travis Duran Date of Exam: 04/27/2020 Medical Rec #:  [id number]    Height:       69.0 in Accession #:    [id number]   Weight:       210.3 lb Date of Birth:  08/14/1975     BSA:          2.111 m Patient Age:    23 years     BP:           117/80 mmHg Patient Gender: M            HR:           105 bpm. Exam Location:  Inpatient Procedure: 2D Echo, Color Doppler, Cardiac Doppler and Strain Analysis Indications:    Pre-Chemo  Evaluation  History:        Patient has no prior history of Echocardiogram examinations.                 Risk Factors:Sleep Apnea. Mediastinal mass.  Sonographer:    Raquel Sarna Senior RDCS Referring Phys: [place] Comments: Technically challenging due to shadowing from mass and shifted heart position in chest. IMPRESSIONS  1. Left ventricular ejection fraction, by estimation, is 60 to 65%. The left ventricle has normal function. The left ventricle has no regional wall motion abnormalities. Left ventricular diastolic parameters are consistent with Grade I diastolic dysfunction (impaired relaxation). The average left ventricular global longitudinal strain is -17.3 %. The global longitudinal strain is normal.  2. Right ventricular systolic function is normal. The right ventricular size is normal.  3. The mitral valve is normal in structure. No evidence of mitral valve regurgitation. No evidence of mitral stenosis.  4. The aortic valve is tricuspid. Aortic valve regurgitation is not visualized. No aortic stenosis is present.  5. The inferior vena cava is dilated in size with <50% respiratory variability, suggesting right atrial pressure of 15 mmHg. FINDINGS  Left Ventricle: Left ventricular ejection fraction, by estimation, is 60 to 65%. The left ventricle has normal function. The left ventricle has no regional wall motion abnormalities. The average left ventricular global longitudinal strain is -17.3 %. The global longitudinal strain is normal. The left ventricular internal cavity size was normal in size. There is no left ventricular hypertrophy. Left ventricular diastolic parameters are consistent with Grade I diastolic dysfunction (impaired relaxation). Right Ventricle: The right ventricular size is normal. No increase in right ventricular wall thickness. Right ventricular systolic function is normal. Left Atrium: Left atrial size was normal in size. Right Atrium: Right atrial size was normal in  size. Pericardium: There is no evidence of pericardial effusion. Mitral Valve: The mitral valve is normal in structure. No evidence of mitral valve regurgitation. No evidence of mitral valve stenosis. Tricuspid Valve: The tricuspid valve is normal in structure. Tricuspid valve regurgitation is not demonstrated. No evidence of tricuspid stenosis. Aortic Valve: The aortic valve is tricuspid. Aortic valve regurgitation is not visualized. No aortic stenosis is present. Pulmonic Valve: The pulmonic valve was normal in structure. Pulmonic valve regurgitation is not visualized. No evidence of pulmonic stenosis. Aorta: The aortic root is normal in size and structure. Venous: The inferior vena cava is dilated in size with less than 50% respiratory variability, suggesting right atrial pressure of 15 mmHg. IAS/Shunts: No atrial level shunt detected by color flow Doppler. Additional Comments: Mass noted anterior to the right ventricle  and apex.  LEFT VENTRICLE PLAX 2D LVOT diam:     2.20 cm LV SV:         65       2D Longitudinal Strain LV SV Index:   31       2D Strain GLS (A2C):   -15.8 % LVOT Area:     3.80 cm 2D Strain GLS (A3C):   -18.4 %                         2D Strain GLS (A4C):   -17.8 %                         2D Strain GLS Avg:     -17.3 % RIGHT VENTRICLE RV S prime:     10.00 cm/s TAPSE (M-mode): 1.5 cm LEFT ATRIUM             Index       RIGHT ATRIUM           Index LA Vol (A2C):   25.9 ml 12.27 ml/m RA Area:     13.40 cm LA Vol (A4C):   17.6 ml 8.34 ml/m  RA Volume:   32.00 ml  15.16 ml/m LA Biplane Vol: 21.9 ml 10.38 ml/m  AORTIC VALVE LVOT Vmax:   97.30 cm/s LVOT Vmean:  74.600 cm/s LVOT VTI:    0.170 m  AORTA Ao Root diam: 3.30 cm Ao Asc diam:  3.30 cm  SHUNTS Systemic VTI:  0.17 m Systemic Diam: 2.20 cm Skeet Latch MD Electronically signed by Skeet Latch MD Signature Date/Time: 04/27/2020/12:40:00 PM    Final    IR PICC PLACEMENT RIGHT <5 YRS INC IMG GUIDE  Result Date:  05/02/2020 INDICATION: 44 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 For this, he requires durable venous access and presents for PICC placement. EXAM: PICC LINE PLACEMENT WITH ULTRASOUND AND FLUOROSCOPIC GUIDANCE MEDICATIONS: None. ANESTHESIA/SEDATION: None. FLUOROSCOPY TIME:  Fluoroscopy Time: 0 minutes 24 seconds (7 mGy). COMPLICATIONS: None immediate. PROCEDURE: The patient was advised of the possible risks and complications and agreed to undergo the procedure. The patient was then brought to the angiographic suite for the procedure. The right/left arm was prepped with chlorhexidine, draped in the usual sterile fashion using maximum barrier technique (cap and mask, sterile gown, sterile gloves, large sterile sheet, hand hygiene and cutaneous antisepsis) and infiltrated locally with 1% Lidocaine. Ultrasound demonstrated patency of the right basilic vein, and this was documented with an image. Under real-time ultrasound guidance, this vein was accessed with a 21 gauge micropuncture needle and image documentation was performed. A 0.018 wire was introduced in to the vein. Over this, a 6 [place] dual lumen power injectable PICC was advanced to the lower SVC/right atrial junction. Fluoroscopy during the procedure and fluoro spot radiograph confirms appropriate catheter position. The catheter was flushed and covered with sterile dressing. Catheter length: 42 cm IMPRESSION: Successful right arm power PICC line placement with ultrasound and fluoroscopic guidance. The catheter is ready for use. Electronically Signed   By: Jacqulynn Cadet M.D.   On: 05/02/2020 08:52   IR THORACENTESIS ASP PLEURAL SPACE W/IMG GUIDE  Result Date: 04/25/2020 INDICATION: Patient with newly diagnosed lung mass with large left pleural effusion presents today for a therapeutic and diagnostic thoracentesis. EXAM: ULTRASOUND GUIDED THORACENTESIS MEDICATIONS: 1% lidocaine 10 COMPLICATIONS: None immediate.  PROCEDURE: An ultrasound guided thoracentesis was thoroughly discussed with  the patient and questions answered. The benefits, risks, alternatives and complications were also discussed. The patient understands and wishes to proceed with the procedure. Written consent was obtained. Ultrasound was performed to localize and mark an adequate pocket of fluid in the left chest. The area was then prepped and draped in the normal sterile fashion. 1% Lidocaine was used for local anesthesia. Under ultrasound guidance a 6 Fr Safe-T-Centesis catheter was introduced. Thoracentesis was performed. The catheter was removed and a dressing applied. FINDINGS: A total of approximately 1.8 L of dark yellow fluid was removed. Samples were sent to the laboratory as requested by the clinical team. IMPRESSION: Successful ultrasound guided left thoracentesis yielding 1.8 L of pleural fluid. Read by: Soyla Dryer, NP Electronically Signed   By: Jacqulynn Cadet M.D.   On: 04/25/2020 10:03   Assessment and Plan:  1.  Primary mediastinal B-cell lymphoma 2.  Pleural effusion secondary to #1 3.  Cough and shortness of breath secondary to #1 and #2  -Admit to inpatient oncology unit.  Adverse effects of treatment have been reviewed with the patient and his wife at his visit yesterday including not limited to alopecia, myelosuppression, nausea, vomiting, peripheral neuropathy, renal and liver dysfunction, infusion reaction with Rituxan.  The patient agrees to proceed. -Echocardiogram from 04/27/2020 has been reviewed and shows LVEF of 60 to 65%. -Labs from 05/01/2020 have been reviewed and are adequate for treatment. -PICC line has been placed this morning and ready for use for chemotherapy.  Labs to be drawn through PICC line. -Regular diet. -Lovenox for DVT prophylaxis. -He may use Robitussin-DM or Hycodan as needed for cough. -As needed antiemetics and stool softeners available. -Oxycodone has been ordered for pain as  needed.  Mikey Bussing, DNP, AGPCNP-BC, AOCNP  ADDENDUM: We are starting treatment quickly because he is symptomatic.  I do not want to see him get more symptomatic with coughing.  I would like to not see him develop a pleural effusion that is going to be a problem.  He is going to have to have his outpatient work-up with the PET scan once he gets discharged.  We will have to be aggressive.  He is young.  He really is in good shape so I do not think that there should be a problem with him his blood counts.  He will need to have Neulasta once he gets finished with treatment.  We will make sure he is on allopurinol.  We will check his labs daily.  I know that he will get outstanding care from all the staff up on 6 E.  Lattie Haw, MD

## 2020-05-02 NOTE — Telephone Encounter (Signed)
I do not see that a call was placed to this pt  LMTCB x 1

## 2020-05-03 LAB — COMPREHENSIVE METABOLIC PANEL
ALT: 48 U/L — ABNORMAL HIGH (ref 0–44)
AST: 23 U/L (ref 15–41)
Albumin: 3 g/dL — ABNORMAL LOW (ref 3.5–5.0)
Alkaline Phosphatase: 82 U/L (ref 38–126)
Anion gap: 10 (ref 5–15)
BUN: 18 mg/dL (ref 6–20)
CO2: 22 mmol/L (ref 22–32)
Calcium: 9 mg/dL (ref 8.9–10.3)
Chloride: 104 mmol/L (ref 98–111)
Creatinine, Ser: 0.88 mg/dL (ref 0.61–1.24)
GFR calc Af Amer: >60 mL/min (ref >60)
GFR calc non Af Amer: >60 mL/min (ref >60)
Glucose, Bld: 133 mg/dL — ABNORMAL HIGH (ref 70–99)
Potassium: 4.4 mmol/L (ref 3.5–5.1)
Sodium: 136 mmol/L (ref 135–145)
Total Bilirubin: 0.6 mg/dL (ref 0.3–1.2)
Total Protein: 6.3 g/dL — ABNORMAL LOW (ref 6.5–8.1)

## 2020-05-03 LAB — CBC WITH DIFFERENTIAL/PLATELET
Abs Immature Granulocytes: 0.06 10*3/uL (ref 0.00–0.07)
Basophils Absolute: 0 10*3/uL (ref 0.0–0.1)
Basophils Relative: 0 %
Eosinophils Absolute: 0 10*3/uL (ref 0.0–0.5)
Eosinophils Relative: 0 %
HCT: 37.5 % — ABNORMAL LOW (ref 39.0–52.0)
Hemoglobin: 12.1 g/dL — ABNORMAL LOW (ref 13.0–17.0)
Immature Granulocytes: 1 %
Lymphocytes Relative: 4 %
Lymphs Abs: 0.4 10*3/uL — ABNORMAL LOW (ref 0.7–4.0)
MCH: 28.9 pg (ref 26.0–34.0)
MCHC: 32.3 g/dL (ref 30.0–36.0)
MCV: 89.5 fL (ref 80.0–100.0)
Monocytes Absolute: 0.7 10*3/uL (ref 0.1–1.0)
Monocytes Relative: 6 %
Neutro Abs: 9.9 10*3/uL — ABNORMAL HIGH (ref 1.7–7.7)
Neutrophils Relative %: 89 %
Platelets: 384 10*3/uL (ref 150–400)
RBC: 4.19 MIL/uL — ABNORMAL LOW (ref 4.22–5.81)
RDW: 14 % (ref 11.5–15.5)
WBC: 11 10*3/uL — ABNORMAL HIGH (ref 4.0–10.5)
nRBC: 0 % (ref 0.0–0.2)

## 2020-05-03 LAB — LACTATE DEHYDROGENASE: LDH: 644 U/L — ABNORMAL HIGH (ref 98–192)

## 2020-05-03 LAB — URIC ACID: Uric Acid, Serum: 5.2 mg/dL (ref 3.7–8.6)

## 2020-05-03 MED ORDER — BIOTENE DRY MOUTH MT LIQD
15.0000 mL | OROMUCOSAL | Status: DC
  Administered 2020-05-03 – 2020-05-07 (×12): 15 mL via OROMUCOSAL

## 2020-05-03 MED ORDER — SODIUM BICARBONATE/SODIUM CHLORIDE MOUTHWASH
OROMUCOSAL | Status: DC
  Filled 2020-05-03: qty 1000

## 2020-05-03 MED ORDER — CHLORHEXIDINE GLUCONATE CLOTH 2 % EX PADS
6.0000 | MEDICATED_PAD | Freq: Every day | CUTANEOUS | Status: DC
  Administered 2020-05-04 – 2020-05-06 (×3): 6 via TOPICAL

## 2020-05-03 NOTE — Progress Notes (Signed)
Mr. Stiefel is doing well.  He has had the first day of chemotherapy.  So far, no nausea or vomiting.  His cough may be a little bit better.  He slept well.  He has had no mouth sores.  He has had no diarrhea.  He has been out of bed.  His vital signs all look stable.  His temperature is 98.  Pulse 94.  Blood pressure 100/74.  Oxygen saturation is 96%.  His lungs are with better breath sounds bilaterally.  He still has some wheezing.  Cardiac exam regular rate and rhythm.  Oral exam shows no mucositis.  Abdomen is soft.  Bowel sounds are present.  There is no guarding or rebound tenderness.  Extremities shows no clubbing, cyanosis or edema.  He has good range of motion of his joints.  Neurological exam is nonfocal.  LDH is now down to 644.  His white cell count 11.  Hemoglobin 12.1.  Platelet count 384,000.  BUN is 18 creatinine 0.88.  Albumin 3.0.  Mr. Monk will continue chemotherapy with infusional EPOCH.  I did put the order in for Rituxan to be given on the fifth day of treatment.  I apologize for not putting this in with the original chemotherapy protocol.  As such, I really do want him to have treatment with Rituxan with him in the hospital.  Lattie Haw, MD  Exodus 14:14

## 2020-05-04 ENCOUNTER — Encounter: Payer: Self-pay | Admitting: *Deleted

## 2020-05-04 LAB — COMPREHENSIVE METABOLIC PANEL
ALT: 47 U/L — ABNORMAL HIGH (ref 0–44)
AST: 24 U/L (ref 15–41)
Albumin: 3.1 g/dL — ABNORMAL LOW (ref 3.5–5.0)
Alkaline Phosphatase: 84 U/L (ref 38–126)
Anion gap: 11 (ref 5–15)
BUN: 18 mg/dL (ref 6–20)
CO2: 22 mmol/L (ref 22–32)
Calcium: 9.1 mg/dL (ref 8.9–10.3)
Chloride: 105 mmol/L (ref 98–111)
Creatinine, Ser: 0.95 mg/dL (ref 0.61–1.24)
GFR calc Af Amer: >60 mL/min (ref >60)
GFR calc non Af Amer: >60 mL/min (ref >60)
Glucose, Bld: 187 mg/dL — ABNORMAL HIGH (ref 70–99)
Potassium: 3.8 mmol/L (ref 3.5–5.1)
Sodium: 138 mmol/L (ref 135–145)
Total Bilirubin: 0.5 mg/dL (ref 0.3–1.2)
Total Protein: 6.2 g/dL — ABNORMAL LOW (ref 6.5–8.1)

## 2020-05-04 LAB — CBC WITH DIFFERENTIAL/PLATELET
Abs Immature Granulocytes: 0.13 10*3/uL — ABNORMAL HIGH (ref 0.00–0.07)
Basophils Absolute: 0 10*3/uL (ref 0.0–0.1)
Basophils Relative: 0 %
Eosinophils Absolute: 0 10*3/uL (ref 0.0–0.5)
Eosinophils Relative: 0 %
HCT: 37.4 % — ABNORMAL LOW (ref 39.0–52.0)
Hemoglobin: 12.3 g/dL — ABNORMAL LOW (ref 13.0–17.0)
Immature Granulocytes: 1 %
Lymphocytes Relative: 3 %
Lymphs Abs: 0.5 10*3/uL — ABNORMAL LOW (ref 0.7–4.0)
MCH: 28.6 pg (ref 26.0–34.0)
MCHC: 32.9 g/dL (ref 30.0–36.0)
MCV: 87 fL (ref 80.0–100.0)
Monocytes Absolute: 1.2 10*3/uL — ABNORMAL HIGH (ref 0.1–1.0)
Monocytes Relative: 6 %
Neutro Abs: 17.2 10*3/uL — ABNORMAL HIGH (ref 1.7–7.7)
Neutrophils Relative %: 90 %
Platelets: 418 10*3/uL — ABNORMAL HIGH (ref 150–400)
RBC: 4.3 MIL/uL (ref 4.22–5.81)
RDW: 14.2 % (ref 11.5–15.5)
WBC: 19 10*3/uL — ABNORMAL HIGH (ref 4.0–10.5)
nRBC: 0 % (ref 0.0–0.2)

## 2020-05-04 LAB — LACTATE DEHYDROGENASE: LDH: 689 U/L — ABNORMAL HIGH (ref 98–192)

## 2020-05-04 LAB — URIC ACID: Uric Acid, Serum: 4.8 mg/dL (ref 3.7–8.6)

## 2020-05-04 NOTE — Progress Notes (Signed)
Travis Duran is doing okay right now.  He has had 2 days of chemotherapy.  He has had no problems with nausea or vomiting.  He has had no issues with diarrhea or constipation.  He is out of bed.  He still has the cough.  He is not coughing up any purulent material.  There is no blood.  His appetite is doing okay.  Labs not back yet today.  His vital signs show temperature 97.5.  Pulse 95.  Blood pressure 122/87.  Oxygen saturation on room air is 97%.  His lungs are clear bilaterally.  There may be some slight decrease over on the left side.  No wheezes are noted.  Cardiac exam regular rate and rhythm.  Abdomen is soft.  Bowel sounds are present.  Extremities shows no clubbing, cyanosis or edema.  Neurological exam is nonfocal.  Oral exam shows no mucositis.  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  He is on day #3 of chemotherapy.  We will continue to follow along.  He is getting outstanding care from all the staff up on 6 E.  Lattie Haw, MD  Romans 8:28

## 2020-05-05 ENCOUNTER — Inpatient Hospital Stay (HOSPITAL_COMMUNITY): Payer: BC Managed Care – PPO

## 2020-05-05 ENCOUNTER — Telehealth: Payer: BC Managed Care – PPO | Admitting: Family Medicine

## 2020-05-05 DIAGNOSIS — C8332 Diffuse large B-cell lymphoma, intrathoracic lymph nodes: Secondary | ICD-10-CM

## 2020-05-05 DIAGNOSIS — J90 Pleural effusion, not elsewhere classified: Secondary | ICD-10-CM

## 2020-05-05 LAB — LACTATE DEHYDROGENASE: LDH: 649 U/L — ABNORMAL HIGH (ref 98–192)

## 2020-05-05 LAB — CBC WITH DIFFERENTIAL/PLATELET
Abs Immature Granulocytes: 0.14 10*3/uL — ABNORMAL HIGH (ref 0.00–0.07)
Basophils Absolute: 0 10*3/uL (ref 0.0–0.1)
Basophils Relative: 0 %
Eosinophils Absolute: 0 10*3/uL (ref 0.0–0.5)
Eosinophils Relative: 0 %
HCT: 36.7 % — ABNORMAL LOW (ref 39.0–52.0)
Hemoglobin: 11.8 g/dL — ABNORMAL LOW (ref 13.0–17.0)
Immature Granulocytes: 1 %
Lymphocytes Relative: 3 %
Lymphs Abs: 0.3 10*3/uL — ABNORMAL LOW (ref 0.7–4.0)
MCH: 28.1 pg (ref 26.0–34.0)
MCHC: 32.2 g/dL (ref 30.0–36.0)
MCV: 87.4 fL (ref 80.0–100.0)
Monocytes Absolute: 0.4 10*3/uL (ref 0.1–1.0)
Monocytes Relative: 4 %
Neutro Abs: 11.6 10*3/uL — ABNORMAL HIGH (ref 1.7–7.7)
Neutrophils Relative %: 92 %
Platelets: 410 10*3/uL — ABNORMAL HIGH (ref 150–400)
RBC: 4.2 MIL/uL — ABNORMAL LOW (ref 4.22–5.81)
RDW: 14.1 % (ref 11.5–15.5)
WBC: 12.5 10*3/uL — ABNORMAL HIGH (ref 4.0–10.5)
nRBC: 0 % (ref 0.0–0.2)

## 2020-05-05 LAB — COMPREHENSIVE METABOLIC PANEL
ALT: 62 U/L — ABNORMAL HIGH (ref 0–44)
AST: 29 U/L (ref 15–41)
Albumin: 2.9 g/dL — ABNORMAL LOW (ref 3.5–5.0)
Alkaline Phosphatase: 81 U/L (ref 38–126)
Anion gap: 9 (ref 5–15)
BUN: 22 mg/dL — ABNORMAL HIGH (ref 6–20)
CO2: 24 mmol/L (ref 22–32)
Calcium: 9.2 mg/dL (ref 8.9–10.3)
Chloride: 106 mmol/L (ref 98–111)
Creatinine, Ser: 0.87 mg/dL (ref 0.61–1.24)
GFR calc Af Amer: >60 mL/min (ref >60)
GFR calc non Af Amer: >60 mL/min (ref >60)
Glucose, Bld: 113 mg/dL — ABNORMAL HIGH (ref 70–99)
Potassium: 3.8 mmol/L (ref 3.5–5.1)
Sodium: 139 mmol/L (ref 135–145)
Total Bilirubin: 0.6 mg/dL (ref 0.3–1.2)
Total Protein: 5.9 g/dL — ABNORMAL LOW (ref 6.5–8.1)

## 2020-05-05 LAB — URIC ACID: Uric Acid, Serum: 4.6 mg/dL (ref 3.7–8.6)

## 2020-05-05 MED ORDER — VINCRISTINE SULFATE CHEMO INJECTION 1 MG/ML
Freq: Once | INTRAVENOUS | Status: AC
  Filled 2020-05-05: qty 11

## 2020-05-05 MED ORDER — SODIUM CHLORIDE 0.9 % IV SOLN
Freq: Once | INTRAVENOUS | Status: AC
  Administered 2020-05-05: 18 mg via INTRAVENOUS
  Filled 2020-05-05: qty 4

## 2020-05-05 MED ORDER — CEFAZOLIN SODIUM-DEXTROSE 2-4 GM/100ML-% IV SOLN
2.0000 g | INTRAVENOUS | Status: AC
  Filled 2020-05-05: qty 100
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
## 2020-05-05 NOTE — Progress Notes (Signed)
Ms. Doolittle is doing well with chemotherapy.  This is his third back of chemotherapy.  He will have the Rituxan when he finishes his chemotherapy.  He should be able to get the Rituxan as an inpatient.  He still has a cough.  Not too surprised by this.  He is not coughing of any purulent sputum.  He has had no nausea or vomiting.  He has had no bony pain.  He has had no actual shortness of breath.  He is walking quite a bit.  There is no diarrhea.  He has had no bleeding.  He has had no fever.  His labs look okay.  White cell count 12.5.  Hemoglobin 11.8.  Platelet count 410,000.  BUN is 22 creatinine 0.87.  Uric acid of 4.6.  Calcium 9.2 with an albumin of 2.9.  His vital signs are temperature 98.4.  Pulse 92.  Blood pressure 122/86.  His lungs sound pretty good bilaterally.  He has good air movement bilaterally.  I hear no wheezing.  Cardiac exam regular rate and rhythm with no murmurs, rubs or bruits.  Abdomen is soft.  Bowel sounds are present.  He has no fluid wave.  Extremities shows no clubbing, cyanosis or edema.  Neurological exam is nonfocal.  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  He is on cycle number one of R-EPOCH.  So far has done well.  He will need to have a Port-A-Cath put in.  Hopefully this can be done while he is in the hospital.  We will see about try to do this on Wednesday.  He will need Neulasta.  He will get this in the office.  He will need prophylactic antibiotics.  I appreciate the outstanding care that he is getting from all the staff on 6 E.  Lattie Haw, MD  Romans 1:16
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
## 2020-05-06 ENCOUNTER — Other Ambulatory Visit: Payer: Self-pay | Admitting: Family

## 2020-05-06 ENCOUNTER — Encounter: Payer: Self-pay | Admitting: *Deleted

## 2020-05-06 LAB — CBC WITH DIFFERENTIAL/PLATELET
Abs Immature Granulocytes: 0.05 10*3/uL (ref 0.00–0.07)
Basophils Absolute: 0 10*3/uL (ref 0.0–0.1)
Basophils Relative: 0 %
Eosinophils Absolute: 0 10*3/uL (ref 0.0–0.5)
Eosinophils Relative: 0 %
HCT: 35.7 % — ABNORMAL LOW (ref 39.0–52.0)
Hemoglobin: 11.9 g/dL — ABNORMAL LOW (ref 13.0–17.0)
Immature Granulocytes: 1 %
Lymphocytes Relative: 4 %
Lymphs Abs: 0.4 10*3/uL — ABNORMAL LOW (ref 0.7–4.0)
MCH: 28.7 pg (ref 26.0–34.0)
MCHC: 33.3 g/dL (ref 30.0–36.0)
MCV: 86 fL (ref 80.0–100.0)
Monocytes Absolute: 0.1 10*3/uL (ref 0.1–1.0)
Monocytes Relative: 1 %
Neutro Abs: 10 10*3/uL — ABNORMAL HIGH (ref 1.7–7.7)
Neutrophils Relative %: 94 %
Platelets: 378 10*3/uL (ref 150–400)
RBC: 4.15 MIL/uL — ABNORMAL LOW (ref 4.22–5.81)
RDW: 13.9 % (ref 11.5–15.5)
WBC: 10.6 10*3/uL — ABNORMAL HIGH (ref 4.0–10.5)
nRBC: 0 % (ref 0.0–0.2)

## 2020-05-06 LAB — COMPREHENSIVE METABOLIC PANEL
ALT: 64 U/L — ABNORMAL HIGH (ref 0–44)
AST: 27 U/L (ref 15–41)
Albumin: 2.9 g/dL — ABNORMAL LOW (ref 3.5–5.0)
Alkaline Phosphatase: 82 U/L (ref 38–126)
Anion gap: 8 (ref 5–15)
BUN: 19 mg/dL (ref 6–20)
CO2: 25 mmol/L (ref 22–32)
Calcium: 9 mg/dL (ref 8.9–10.3)
Chloride: 104 mmol/L (ref 98–111)
Creatinine, Ser: 0.86 mg/dL (ref 0.61–1.24)
GFR calc Af Amer: >60 mL/min (ref >60)
GFR calc non Af Amer: >60 mL/min (ref >60)
Glucose, Bld: 106 mg/dL — ABNORMAL HIGH (ref 70–99)
Potassium: 3.7 mmol/L (ref 3.5–5.1)
Sodium: 137 mmol/L (ref 135–145)
Total Bilirubin: 0.6 mg/dL (ref 0.3–1.2)
Total Protein: 5.8 g/dL — ABNORMAL LOW (ref 6.5–8.1)

## 2020-05-06 LAB — PROTIME-INR
INR: 1.2 (ref 0.8–1.2)
Prothrombin Time: 14.3 seconds (ref 11.4–15.2)

## 2020-05-06 LAB — URIC ACID: Uric Acid, Serum: 4.5 mg/dL (ref 3.7–8.6)

## 2020-05-06 LAB — LACTATE DEHYDROGENASE: LDH: 673 U/L — ABNORMAL HIGH (ref 98–192)

## 2020-05-06 MED ORDER — SODIUM CHLORIDE 0.9 % IV SOLN
Freq: Once | INTRAVENOUS | Status: AC
  Administered 2020-05-06: 36 mg via INTRAVENOUS
  Filled 2020-05-06: qty 8

## 2020-05-06 MED ORDER — SODIUM CHLORIDE 0.9 % IV SOLN
750.0000 mg/m2 | Freq: Once | INTRAVENOUS | Status: AC
  Administered 2020-05-06: 1580 mg via INTRAVENOUS
  Filled 2020-05-06: qty 79

## 2020-05-06 MED ORDER — DIPHENHYDRAMINE HCL 50 MG PO CAPS
50.0000 mg | ORAL_CAPSULE | Freq: Once | ORAL | Status: AC
  Administered 2020-05-06: 50 mg via ORAL
  Filled 2020-05-06: qty 1

## 2020-05-06 MED ORDER — SODIUM CHLORIDE 0.9 % IV SOLN
375.0000 mg/m2 | Freq: Once | INTRAVENOUS | Status: AC
  Administered 2020-05-06: 800 mg via INTRAVENOUS
  Filled 2020-05-06: qty 80

## 2020-05-06 MED ORDER — ACETAMINOPHEN 325 MG PO TABS
650.0000 mg | ORAL_TABLET | Freq: Once | ORAL | Status: AC
  Administered 2020-05-06: 650 mg via ORAL
  Filled 2020-05-06: qty 2

## 2020-05-06 MED ORDER — CIPROFLOXACIN HCL 500 MG PO TABS
500.0000 mg | ORAL_TABLET | Freq: Every day | ORAL | Status: DC
  Administered 2020-05-07: 500 mg via ORAL
  Filled 2020-05-06: qty 1

## 2020-05-06 MED ORDER — DIPHENHYDRAMINE HCL 25 MG PO CAPS
ORAL_CAPSULE | ORAL | Status: AC
  Filled 2020-05-06: qty 1

## 2020-05-06 MED ORDER — PROCHLORPERAZINE MALEATE 10 MG PO TABS: 10.0000 mg | ORAL_TABLET | Freq: Four times a day (QID) | ORAL | Status: DC | PRN

## 2020-05-06 MED ORDER — SODIUM CHLORIDE 0.9 % IV SOLN
150.0000 mg | Freq: Once | INTRAVENOUS | Status: AC
  Administered 2020-05-07: 150 mg via INTRAVENOUS
  Filled 2020-05-06: qty 5

## 2020-05-06 MED ORDER — FAMCICLOVIR 500 MG PO TABS
500.0000 mg | ORAL_TABLET | Freq: Every day | ORAL | Status: DC
  Administered 2020-05-07: 500 mg via ORAL
  Filled 2020-05-06: qty 1

## 2020-05-06 NOTE — Progress Notes (Signed)
Patient sent FMLA paperwork, and Cancer Coverage form to office to complete. FMLA given to [place]. Attempted to fill out cancer coverage form, however form states "to be filled out by employer". Reached out to patient to see if there was an additional form for the medical provider. Will fill out if received.  Oncology Nurse Navigator Documentation  Oncology Nurse Navigator Flowsheets 05/06/2020  Abnormal Finding Date -  Confirmed Diagnosis Date -  Diagnosis Status -  Planned Course of Treatment Chemotherapy  Phase of Treatment Chemo  Chemotherapy Actual Start Date: 05/02/2020  Navigator Follow Up Date: 05/08/2020  Navigator Follow Up Reason: Follow-up Appointment  Navigator Location [facility]  Navigator Encounter Type MyChart  Treatment Initiated Date 05/02/2020  Patient Visit Type MedOnc  Treatment Phase Active Tx  Barriers/Navigation Needs Coordination of Care;Education;Employed;Long Distance To Travel  Education Other  Interventions Coordination of Care;Disability/FMLA;Psycho-Social Support  Acuity Level 3-Moderate Needs (3-4 Barriers Identified)  Coordination of Care Appts;Other  Education Method Written  Support Groups/Services Friends and Family  Time Spent with Patient 23

## 2020-05-06 NOTE — Progress Notes (Signed)
I appreciate everybody seen Travis Duran.  He was seen by pulmonary medicine yesterday.  I do think that he will need a thoracentesis today.  I know is not that symptomatic.  However, there is a large effusion on the left side.  I would like to have this taken out before he goes home.  I think with the chemotherapy will start working later on this week so that the effusion will not come back.  Interventional radiology will place a Port-A-Cath tomorrow before he goes home.  This will make life a whole lot easier for Mr. Cutrone this he does live quite a ways off.  He will get his chemotherapy completed today.  He will then have Rituxan afterwards.  His appetite is doing okay.  He has had no nausea or vomiting.  He has had no diarrhea.  He has had no rashes.  There has been no bleeding.  His labs show white cell count 10.6.  Hemoglobin 11.9.  Platelet count 378,000.  His BUN is 19 creatinine 0.86.  Calcium 9 with an albumin of 2.9.  His vital signs show temperature 97.5.  Pulse 89.  Blood pressure 121/91.  Head and neck exam shows no mucositis.  There is no adenopathy in the neck.  His lungs show good breath sounds on the right side.  Left side is decreased about half follow-up.  He has some rales bilaterally.  Some wheezes are noted.  Cardiac exam regular rate and rhythm.  There are no murmurs.  Abdomen is soft.  Bowel sounds are present.  Extremities shows no clubbing, cyanosis or edema.  He has good range of motion of his joints.  Neurological exam is nonfocal.  Mr. Graumann has done great with cycle 1 of chemotherapy for the primary B-cell mediastinal lymphoma.  He has had cycle 1 of R-EPOCH.  Again he will get his Rituxan today after the bolus Cytoxan.  He will have the thoracentesis today.  We will plan for discharge tomorrow after he has the Port-A-Cath placed.  I appreciate the outstanding care that he is gotten from the staff upon 6 E.  As always, they have provided incredibly compassionate  care in a very professional way.  Lattie Haw, MD  Jeneen Rinks 3:17

## 2020-05-06 NOTE — Progress Notes (Addendum)
Patient didn't receive Prednisone on day 1, (05/02/20). MD aware. No additional Prednisone dose on day 6 (05/07/20) prior to patient discharge. [facility] does have Ruxience.   [facility] will make the Ruxience.  Hardie Pulley, PharmD, BCPS, BCOP

## 2020-05-06 NOTE — Progress Notes (Signed)
Attempted to call for patient for IR thoracentesis. Patient is receiving chemotherapy until 18:00. Plan is to perform thoracentesis at time of portacath placement currently scheduled for 10.6.21. Team messaged.

## 2020-05-06 NOTE — Telephone Encounter (Signed)
Pt still currently admitted and had a CXR yesterday 10.4. Will keep message open to assure he gets a HFU appt.

## 2020-05-07 ENCOUNTER — Inpatient Hospital Stay (HOSPITAL_COMMUNITY): Payer: BC Managed Care – PPO

## 2020-05-07 ENCOUNTER — Other Ambulatory Visit: Payer: Self-pay | Admitting: Hematology & Oncology

## 2020-05-07 ENCOUNTER — Telehealth: Payer: Self-pay | Admitting: Pulmonary Disease

## 2020-05-07 HISTORY — PX: IR IMAGING GUIDED PORT INSERTION: IMG5740

## 2020-05-07 HISTORY — PX: IR THORACENTESIS ASP PLEURAL SPACE W/IMG GUIDE: IMG5380

## 2020-05-07 LAB — CBC WITH DIFFERENTIAL/PLATELET
Abs Immature Granulocytes: 0.08 10*3/uL — ABNORMAL HIGH (ref 0.00–0.07)
Basophils Absolute: 0 10*3/uL (ref 0.0–0.1)
Basophils Relative: 0 %
Eosinophils Absolute: 0 10*3/uL (ref 0.0–0.5)
Eosinophils Relative: 0 %
HCT: 35.6 % — ABNORMAL LOW (ref 39.0–52.0)
Hemoglobin: 11.7 g/dL — ABNORMAL LOW (ref 13.0–17.0)
Immature Granulocytes: 1 %
Lymphocytes Relative: 1 %
Lymphs Abs: 0.1 10*3/uL — ABNORMAL LOW (ref 0.7–4.0)
MCH: 28.5 pg (ref 26.0–34.0)
MCHC: 32.9 g/dL (ref 30.0–36.0)
MCV: 86.8 fL (ref 80.0–100.0)
Monocytes Absolute: 0 10*3/uL — ABNORMAL LOW (ref 0.1–1.0)
Monocytes Relative: 0 %
Neutro Abs: 10.3 10*3/uL — ABNORMAL HIGH (ref 1.7–7.7)
Neutrophils Relative %: 98 %
Platelets: 338 10*3/uL (ref 150–400)
RBC: 4.1 MIL/uL — ABNORMAL LOW (ref 4.22–5.81)
RDW: 13.6 % (ref 11.5–15.5)
WBC: 10.5 10*3/uL (ref 4.0–10.5)
nRBC: 0 % (ref 0.0–0.2)

## 2020-05-07 LAB — COMPREHENSIVE METABOLIC PANEL
ALT: 81 U/L — ABNORMAL HIGH (ref 0–44)
AST: 33 U/L (ref 15–41)
Albumin: 2.8 g/dL — ABNORMAL LOW (ref 3.5–5.0)
Alkaline Phosphatase: 82 U/L (ref 38–126)
Anion gap: 8 (ref 5–15)
BUN: 20 mg/dL (ref 6–20)
CO2: 25 mmol/L (ref 22–32)
Calcium: 8.9 mg/dL (ref 8.9–10.3)
Chloride: 102 mmol/L (ref 98–111)
Creatinine, Ser: 0.95 mg/dL (ref 0.61–1.24)
GFR calc non Af Amer: >60 mL/min (ref >60)
Glucose, Bld: 215 mg/dL — ABNORMAL HIGH (ref 70–99)
Potassium: 3.6 mmol/L (ref 3.5–5.1)
Sodium: 135 mmol/L (ref 135–145)
Total Bilirubin: 0.6 mg/dL (ref 0.3–1.2)
Total Protein: 5.7 g/dL — ABNORMAL LOW (ref 6.5–8.1)

## 2020-05-07 LAB — LACTATE DEHYDROGENASE: LDH: 641 U/L — ABNORMAL HIGH (ref 98–192)

## 2020-05-07 LAB — PROTIME-INR
INR: 1.2 (ref 0.8–1.2)
Prothrombin Time: 14.7 seconds (ref 11.4–15.2)

## 2020-05-07 MED ORDER — BIOTENE DRY MOUTH MT LIQD: 15.0000 mL | OROMUCOSAL | 3 refills | Status: DC

## 2020-05-07 MED ORDER — HEPARIN SOD (PORK) LOCK FLUSH 100 UNIT/ML IV SOLN
INTRAVENOUS | Status: AC
  Filled 2020-05-07: qty 5

## 2020-05-07 MED ORDER — PROCHLORPERAZINE MALEATE 10 MG PO TABS: 10.0000 mg | ORAL_TABLET | Freq: Four times a day (QID) | ORAL | 0 refills | Status: DC | PRN

## 2020-05-07 MED ORDER — FENTANYL CITRATE (PF) 100 MCG/2ML IJ SOLN
INTRAMUSCULAR | Status: AC
  Filled 2020-05-07: qty 2

## 2020-05-07 MED ORDER — CHLORHEXIDINE GLUCONATE CLOTH 2 % EX PADS: 6.0000 | MEDICATED_PAD | Freq: Every day | CUTANEOUS | Status: DC

## 2020-05-07 MED ORDER — MIDAZOLAM HCL 2 MG/2ML IJ SOLN
INTRAMUSCULAR | Status: AC | PRN
  Administered 2020-05-07: 1 mg via INTRAVENOUS
  Administered 2020-05-07: 2 mg via INTRAVENOUS
  Administered 2020-05-07: 1 mg via INTRAVENOUS

## 2020-05-07 MED ORDER — FENTANYL CITRATE (PF) 100 MCG/2ML IJ SOLN
INTRAMUSCULAR | Status: AC | PRN
  Administered 2020-05-07 (×2): 50 ug via INTRAVENOUS

## 2020-05-07 MED ORDER — ALLOPURINOL 100 MG PO TABS: 100.0000 mg | ORAL_TABLET | Freq: Every day | ORAL | 0 refills | Status: DC

## 2020-05-07 MED ORDER — LIDOCAINE HCL 1 % IJ SOLN
INTRAMUSCULAR | Status: AC | PRN
  Administered 2020-05-07: 5 mL via INTRADERMAL

## 2020-05-07 MED ORDER — LIDOCAINE-EPINEPHRINE 1 %-1:100000 IJ SOLN
INTRAMUSCULAR | Status: AC
  Filled 2020-05-07: qty 1

## 2020-05-07 MED ORDER — FAMCICLOVIR 500 MG PO TABS: 500.0000 mg | ORAL_TABLET | Freq: Every day | ORAL | 5 refills | Status: DC

## 2020-05-07 MED ORDER — LIDOCAINE HCL 1 % IJ SOLN
INTRAMUSCULAR | Status: AC
  Filled 2020-05-07: qty 20

## 2020-05-07 MED ORDER — ONDANSETRON HCL 4 MG PO TABS: 4.0000 mg | ORAL_TABLET | Freq: Three times a day (TID) | ORAL | 0 refills | Status: DC | PRN

## 2020-05-07 MED ORDER — CEFAZOLIN SODIUM-DEXTROSE 2-4 GM/100ML-% IV SOLN
INTRAVENOUS | Status: AC
  Administered 2020-05-07: 2 g via INTRAVENOUS
  Filled 2020-05-07: qty 100

## 2020-05-07 MED ORDER — LIDOCAINE-EPINEPHRINE 1 %-1:100000 IJ SOLN
INTRAMUSCULAR | Status: AC | PRN
  Administered 2020-05-07 (×2): 10 mL via INTRADERMAL

## 2020-05-07 MED ORDER — HEPARIN SOD (PORK) LOCK FLUSH 100 UNIT/ML IV SOLN
INTRAVENOUS | Status: AC | PRN
  Administered 2020-05-07: 500 [IU] via INTRAVENOUS

## 2020-05-07 MED ORDER — CIPROFLOXACIN HCL 500 MG PO TABS: 500.0000 mg | ORAL_TABLET | Freq: Every day | ORAL | 5 refills | Status: DC

## 2020-05-07 MED ORDER — MIDAZOLAM HCL 2 MG/2ML IJ SOLN
INTRAMUSCULAR | Status: AC
  Filled 2020-05-07: qty 4

## 2020-05-07 NOTE — Procedures (Signed)
  Procedure: R IJ Port catheter placed   EBL:   minimal Complications:  none immediate  See full dictation in [facility].  Dillard Cannon MD Main # [phone] Pager  [phone] Mobile [phone]

## 2020-05-07 NOTE — Discharge Summary (Addendum)
Discharge Summary  Patient ID: Travis Duran MRN: [id number] DOB/AGE: 1976-05-16 44 y.o.  Admit date: 05/02/2020 Discharge date: 05/07/2020  Discharge Diagnoses:  Active Problems:   Lymphoma, large cell, intrathoracic lymph nodes (HCC)   Encounter for antineoplastic chemotherapy   Diffuse large B cell lymphoma (HCC)   Discharged Condition: good  Discharge Labs:    CBC    Component Value Date/Time   WBC 10.5 05/07/2020 0049   RBC 4.10 (L) 05/07/2020 0049   HGB 11.7 (L) 05/07/2020 0049   HGB 14.1 05/01/2020 1442   HCT 35.6 (L) 05/07/2020 0049   PLT 338 05/07/2020 0049   PLT 458 (H) 05/01/2020 1442   MCV 86.8 05/07/2020 0049   MCH 28.5 05/07/2020 0049   MCHC 32.9 05/07/2020 0049   RDW 13.6 05/07/2020 0049   LYMPHSABS 0.1 (L) 05/07/2020 0049   MONOABS 0.0 (L) 05/07/2020 0049   EOSABS 0.0 05/07/2020 0049   BASOSABS 0.0 05/07/2020 0049   CMP Latest Ref Rng & Units 05/07/2020 05/06/2020 05/05/2020  Glucose 70 - 99 mg/dL 215(H) 106(H) 113(H)  BUN 6 - 20 mg/dL 20 19 22(H)  Creatinine 0.61 - 1.24 mg/dL 0.95 0.86 0.87  Sodium 135 - 145 mmol/L 135 137 139  Potassium 3.5 - 5.1 mmol/L 3.6 3.7 3.8  Chloride 98 - 111 mmol/L 102 104 106  CO2 22 - 32 mmol/L 25 25 24   Calcium 8.9 - 10.3 mg/dL 8.9 9.0 9.2  Total Protein 6.5 - 8.1 g/dL 5.7(L) 5.8(L) 5.9(L)  Total Bilirubin 0.3 - 1.2 mg/dL 0.6 0.6 0.6  Alkaline Phos 38 - 126 U/L 82 82 81  AST 15 - 41 U/L 33 27 29  ALT 0 - 44 U/L 81(H) 64(H) 62(H)   Diagnostics: 1.  05/05/2020-chest x-ray which showed a large left pleural effusion with opacification of most of the left hemithorax.  Consults: pulmonary/intensive care and Interventional Radiology  Procedures:  1.  05/07/2020-ultrasound-guided thoracentesis 2.  05/07/2020-Port-A-Cath placement by interventional radiology 3.  05/07/2020-PICC line removal by interventional radiology  Disposition: Discharge disposition: 01-Home or Self Care      Allergies as of 05/07/2020   No Known  Allergies      Medication List     STOP taking these medications    Delsym Cough/Chest Congest DM 5-100 MG/5ML Liqd Generic drug: Dextromethorphan-guaiFENesin       TAKE these medications    allopurinol 100 MG tablet Commonly known as: ZYLOPRIM Take 1 tablet (100 mg total) by mouth daily.   antiseptic oral rinse Liqd 15 mLs by Mouth Rinse route every 4 (four) hours.   ciprofloxacin 500 MG tablet Commonly known as: CIPRO Take 1 tablet (500 mg total) by mouth daily with breakfast.   famciclovir 500 MG tablet Commonly known as: FAMVIR Take 1 tablet (500 mg total) by mouth daily.   Flovent HFA 110 MCG/ACT inhaler Generic drug: fluticasone Inhale 2 puffs into the lungs in the morning and at bedtime.   HYDROcodone-homatropine 5-1.5 MG/5ML syrup Commonly known as: Hycodan Take 5 mLs by mouth every 6 (six) hours as needed for cough.   ondansetron 4 MG tablet Commonly known as: ZOFRAN Take 1-2 tablets (4-8 mg total) by mouth every 8 (eight) hours as needed for nausea (not responsive to prochlorperazine (COMPAZINE)).   prochlorperazine 10 MG tablet Commonly known as: COMPAZINE Take 1 tablet (10 mg total) by mouth every 6 (six) hours as needed for nausea or vomiting.   tadalafil 10 MG tablet Commonly known as: Cialis Take 1 tablet (10 mg  total) by mouth daily as needed for erectile dysfunction.          HPI:  [name] is a 44 year old male who was seen for initial consult during his hospitalization on 04/25/2020.  The patient had progressive shortness of breath over a 6-week period of time and a CT of the chest performed on 04/24/2020 showed a large malignant appearing left anterior mediastinal mass measuring 14.6 x 11.7 x 18.5 cm, large left pleural effusion, and small pericardial effusion.  He underwent several thoracenteses during his hospitalization as well as a CT-guided biopsy of the mediastinal mass.  Cytology from pleural fluid showed cells consistent with  large B-cell lymphoma and CT-guided biopsy also consistent with primary mediastinal large B-cell lymphoma.  Echocardiogram performed on 04/27/2020 showed a LVEF of 60 to 65%.  The patient had a PICC line placed in interventional radiology on 05/02/2020 anticipation of beginning chemotherapy.  He was subsequently admitted to the hospital just after his PICC line placement. He was seen by Dr. Marin Olp on 05/01/2020 and recommendation was for the patient to begin chemotherapy as soon as possible with EPOCH-R.  PET scan will be scheduled at a as an outpatient and Port-A-Cath to be placed this hospitalization for use with future chemotherapy.   On the day of admission, patient continued to have shortness of breath and cough without fevers or chills.  He was not having any chest pain, abdominal pain, nausea, vomiting, constipation, diarrhea.  He was not having any bleeding.  He was admitted for cycle #1 of EPOCH-R.  Hospital Course:  Mr. Vonstein started his chemotherapy as planned on 05/02/2020.  He completed 5 days of his chemotherapy and also received rituximab on day 5.  He tolerated his treatment well overall.  His CBC, CMET, uric acid all remained stable.  LDH remains elevated, but has declined since prior to starting chemotherapy.  During his hospitalization, he has continued to have a cough but is not having significant shortness of breath.  He was seen by pulmonology for consideration of Pleurx catheter.  The patient would like to avoid Pleurx catheter if possible.  His chest x-ray this admission showed reaccumulation of his pleural effusion.  He has been seen by interventional radiology and will undergo ultrasound-guided thoracentesis and Port-A-Cath placement prior to discharge on 05/07/2020.  The patient was seen in the morning of 05/07/2020 and has remained stable.  Once his Port-A-Cath is placed and he has undergone his ultrasound-guided thoracentesis, he may be discharged home.  Physical Exam Vitals  reviewed.  Constitutional:      General: He is not in acute distress. HENT:     Head: Normocephalic.     Nose: Nose normal.     Mouth/Throat:     Pharynx: Oropharynx is clear.  Eyes:     General: No scleral icterus.    Conjunctiva/sclera: Conjunctivae normal.  Cardiovascular:     Rate and Rhythm: Normal rate and regular rhythm.     Pulses: Normal pulses.     Heart sounds: Normal heart sounds.  Pulmonary:     Effort: Pulmonary effort is normal.     Comments: Diminished Left lung, clear on right Abdominal:     General: Bowel sounds are normal. There is no distension.     Palpations: Abdomen is soft.  Musculoskeletal:        General: Normal range of motion.  Skin:    General: Skin is warm and dry.  Neurological:     General: No focal deficit present.  Mental Status: He is alert and oriented to person, place, and time.  Psychiatric:        Mood and Affect: Mood normal.        Behavior: Behavior normal.        Thought Content: Thought content normal.        Judgment: Judgment normal.     We will arrange for outpatient follow-up at the [facility]. He will be called with the date and time of his appointment.  Signed: Mikey Bussing 05/07/2020, 7:46 AM  ADDENDUM: I agree with the above note.  Mr. Lemmons has done great.  He will have his Port-A-Cath today.  We will have a thoracentesis today.  He will come to the office tomorrow for his Neulasta.  We will then plan for follow-up visits.  We will set up the PET scan at [facility].  They will not schedule anything until he is out of the hospital.  Hopefully, in 1 week, he will start feeling a whole lot better.  Lattie Haw, MD

## 2020-05-07 NOTE — Discharge Instructions (Signed)
Non-Hodgkin Lymphoma, Adult Non-Hodgkin lymphoma (NHL) is a cancer of the lymphatic system. The lymphatic system is part of the body's defense (immune) system. It protects the body from:  Infections.  Germs.  Diseases. NHL affects a type of white blood cell called lymphocytes. There are many different types of NHL. NHL can occur in the B lymphocytes (B cells), T lymphocytes (T cells), and natural killer (NK) cells. The kind of NHL you have depends on the type of cells it affects and how quickly it grows and spreads. What are the causes? The cause of this condition is not known. What increases the risk? You are more likely to develop this condition if:  You have a weak immune system, especially after receiving an organ from a donor (transplant).  You have an autoimmune disease like rheumatoid arthritis.  You have infections from bacteria or viruses. These include: ? Human immunodeficiency virus (HIV). ? Epstein-Barr virus.  You are over the age of 91.  You are male.  You are Caucasian.  You have been treated with high-energy beams that destroy cancer cells(radiation therapy) for other cancers. What are the signs or symptoms? Symptoms of this condition depend on the type, where it is in the body, and whether it is fast-growing or slow-growing. Some people may not have any symptoms. Common symptoms include:  Swelling of the collections of tissue that filter bacteria, viruses, and waste from the bloodstream (lymph nodes) in your neck, armpits, or groin.  Fever.  Unexplained weight loss.  Night sweats.  Tiredness (fatigue). How is this diagnosed? This condition may be diagnosed based on:  A physical exam and medical history.  Chest X-ray.  Testing of: ? Tissue from your lymph gland or bone marrow (biopsy). ? Spinal fluid (lumbar puncture or spinal tap). ? Abdominal or chest fluid. ? Blood. ? Urine.  Imaging tests, such as: ? CT scan. ? PET scan (positron emission  tomography). ? MRI. Your health care provider will do more tests to determine whether the cancer has spread, where it has spread to, and how severe it is. This is called staging. You may need to have other tests to look for certain proteins or gene mutations that may help with treatment planning. How is this treated? Treatment for this condition depends on your symptoms, the type and stage of NHL, and how fast it is spreading. Treatment may include:  Radiation therapy.  Chemotherapy. This uses medicine to destroy cancer cells.  Biological therapy. This helps to control the spread of cancer by boosting the body's immune system.  Targeted medicines to treat specific proteins or gene mutations that are present in your lymphoma.  Bone marrow or stem cell transplantation. In this procedure, you receive high doses of chemotherapy followed by a healthy bone marrow or stem cell transplant from a donor.  Participating in clinical trials to see if new (experimental) treatments are effective.  Surgery to remove the lymphoma.  Blood or platelets from a donor (transfusion). This may be done if your blood counts are low. Follow these instructions at home: Medicines  Take over-the-counter and prescription medicines only as told by your health care provider.  Do not take dietary supplements or herbal medicines unless your health care provider tells you to take them. Some supplements can interfere with how well your treatment works. Lifestyle  Get enough sleep. Most adults need 6-8 hours of sleep each night. During treatment, you may need more sleep.  Consider joining a cancer support group. Ask your health  care provider for more information about local and online support groups. General instructions   Drink enough fluid to keep your urine pale yellow.  Wash your hands often with soap and water. If soap and water are not available, use hand sanitizer.  Tell your cancer care team if you develop  side effects from treatment. They may be able to recommend ways to manage them.  Keep all follow-up visits as told by your health care provider. This is important. Where to find more information  American Cancer Society: www.cancer.org  Leukemia and Lymphoma Society: PreviewPal.pl  National Cancer Institute ([place]): www.cancer.gov Contact a health care provider if you:  Have new lumps under your arms, on your neck, or near your groin.  Have painful lymph nodes.  Have nausea or vomiting.  Have constipation or diarrhea.  Have trouble urinating or painful urination.  Have a skin rash.  Have abdominal pain.  Have joint pain.  Feel dizzy or light-headed.  Have a fever or chills. Get help right away if you:  Have trouble breathing or chest pain.  Have blood in your urine or stool. Summary  Non-Hodgkin lymphoma (NHL) is a cancer of the lymphatic system. The lymphatic system is part of your body's defense (immune) system.  Treatment for this condition depends on your symptoms, the type and stage of NHL, and how fast it is spreading.  Tell your cancer care team if you develop side effects from treatment. They may be able to recommend ways to manage them.  Contact your health care provider if you have new lumps under your arms, on your neck, or near your groin. This information is not intended to replace advice given to you by your health care provider. Make sure you discuss any questions you have with your health care provider. Document Revised: 10/05/2018 Document Reviewed: 10/05/2018 Elsevier Patient Education  [place].

## 2020-05-07 NOTE — Progress Notes (Signed)
MEDICATION-RELATED CONSULT NOTE   IR Procedure Consult - Anticoagulant/Antiplatelet PTA/Inpatient Med List Review by Pharmacist    Procedure: R IJ Port catheter placement and left  thoracentesis    Completed: 10/6 at ~2p  Post-Procedural bleeding risk per IR MD assessment:  low  Antithrombotic medications on inpatient or PTA profile prior to procedure:    - no active order for anticoag. - lovenox 40 mg daily d/ced this morning (10/6) by Dr. Marin Olp  Recommended restart time per IR Post-Procedure Guidelines:   - lovenox 40mg  can be resumed at least 4 hrs after the procedure or at next standard dose interval    Plan:     - if patient does not get discharged on 10/6, consider resuming lovenox 40 mg daily back tonight.  Dia Sitter, PharmD, BCPS 05/07/2020 2:26 PM

## 2020-05-07 NOTE — Telephone Encounter (Signed)
Pt calling to see if he still needs to come 10/7. He had a chest x ray and thoracentesis today with x ray after. Picc line removed and porta-cath placed. Pt still in hospital.(hopefully to be d/c'd today) Saw Pulmonologist while in hospital. Please advise(cancel appt if not needed as pt has another appt the same RXVQ)[phone]

## 2020-05-07 NOTE — Telephone Encounter (Signed)
Called and spoke with pt. Pt stated he was just released from the hospital today 10/6. Pt had a thoracentesis performed today and then had a cxr performed after the xray was done. Pt has another appt scheduled tomorrow 10/7 at the same time as his appt that he was to have with Aaron Edelman so pt requested to have the OV here pushed back a week. Pt's appt has been rescheduled with Brian 10/14 at 2pm. Nothing further needed.

## 2020-05-07 NOTE — Progress Notes (Signed)
Pt discharged home with all belongings. Discharge education completed, instructions provided for port-a-cath care, including signs and symptoms to watch for infection. No questions or concerns at this time. Encouraged pt to follow up with provider's office if any needs or concerns arise.

## 2020-05-07 NOTE — Procedures (Signed)
PROCEDURE SUMMARY:  Successful US guided left thoracentesis. Yielded 1.6 L of dark yellow fluid. Pt tolerated procedure well. No immediate complications.  CXR ordered; no post-procedure pneumothorax identified.   EBL < 2 mL  Theresa Duty, NP 05/07/2020 1:19 PM

## 2020-05-08 ENCOUNTER — Inpatient Hospital Stay: Payer: BC Managed Care – PPO | Attending: Hematology & Oncology

## 2020-05-08 ENCOUNTER — Encounter: Payer: Self-pay | Admitting: *Deleted

## 2020-05-08 ENCOUNTER — Other Ambulatory Visit: Payer: Self-pay | Admitting: Family

## 2020-05-08 ENCOUNTER — Ambulatory Visit: Payer: BC Managed Care – PPO

## 2020-05-08 ENCOUNTER — Ambulatory Visit: Payer: BC Managed Care – PPO | Admitting: Pulmonary Disease

## 2020-05-08 ENCOUNTER — Other Ambulatory Visit: Payer: Self-pay

## 2020-05-08 VITALS — BP 139/87 | HR 78 | Temp 98.3°F | Resp 19

## 2020-05-08 DIAGNOSIS — J910 Malignant pleural effusion: Secondary | ICD-10-CM | POA: Insufficient documentation

## 2020-05-08 DIAGNOSIS — Z452 Encounter for adjustment and management of vascular access device: Secondary | ICD-10-CM | POA: Diagnosis not present

## 2020-05-08 DIAGNOSIS — Z5111 Encounter for antineoplastic chemotherapy: Secondary | ICD-10-CM

## 2020-05-08 DIAGNOSIS — C8582 Other specified types of non-Hodgkin lymphoma, intrathoracic lymph nodes: Secondary | ICD-10-CM

## 2020-05-08 DIAGNOSIS — Z5189 Encounter for other specified aftercare: Secondary | ICD-10-CM | POA: Diagnosis not present

## 2020-05-08 DIAGNOSIS — C8520 Mediastinal (thymic) large B-cell lymphoma, unspecified site: Secondary | ICD-10-CM | POA: Insufficient documentation

## 2020-05-08 DIAGNOSIS — C8332 Diffuse large B-cell lymphoma, intrathoracic lymph nodes: Secondary | ICD-10-CM

## 2020-05-08 LAB — SURGICAL PATHOLOGY

## 2020-05-08 MED ORDER — PEGFILGRASTIM-CBQV 6 MG/0.6ML ~~LOC~~ SOSY
PREFILLED_SYRINGE | SUBCUTANEOUS | Status: AC
  Filled 2020-05-08: qty 0.6

## 2020-05-08 MED ORDER — PEGFILGRASTIM-CBQV 6 MG/0.6ML ~~LOC~~ SOSY
6.0000 mg | PREFILLED_SYRINGE | Freq: Once | SUBCUTANEOUS | Status: AC
  Administered 2020-05-08: 6 mg via SUBCUTANEOUS

## 2020-05-08 NOTE — Progress Notes (Signed)
Scheduled patient's PET scan for 05/15/20. When patient here today for injection, appointment including date, time, location and prep reviewed with patient. Also gave patient radiology info sheet with all the above included for reinforcement.  Reviewed side effects and treatment of side effects from his Neulasta injection today.   FMLA and Cancer policy forms need to be completed and sent. Patient will send me fax number for FMLA forms via MyChart at a later time.   Will speak with Dr Marin Olp and make arrangement for his next hospital admission for cycle 2.   Oncology Nurse Navigator Documentation  Oncology Nurse Navigator Flowsheets 05/08/2020  Abnormal Finding Date -  Confirmed Diagnosis Date -  Diagnosis Status -  Planned Course of Treatment -  Phase of Treatment -  Chemotherapy Actual Start Date: -  Navigator Follow Up Date: 05/09/2020  Navigator Follow Up Reason: Appointment Review;Other:  Production assistant, radio Encounter Type Treatment  Treatment Initiated Date -  Patient Visit Type MedOnc  Treatment Phase Active Tx  Barriers/Navigation Needs Coordination of Care;Education;Employed;Long Distance To Travel  Education Pain/ Symptom Management  Interventions Coordination of Care;Disability/FMLA;Psycho-Social Support  Acuity Level 3-Moderate Needs (3-4 Barriers Identified)  Coordination of Care Appts;Radiology  Education Method Written;Verbal  Support Groups/Services Friends and Family  Time Spent with Patient 17

## 2020-05-08 NOTE — Patient Instructions (Signed)

## 2020-05-08 NOTE — Telephone Encounter (Signed)
Pt scheduled for HFU on 10/14. Nothing further needed at this time.

## 2020-05-08 NOTE — Progress Notes (Signed)
   Subjective:    Patient ID: Travis Duran, male    DOB: 09/11/1975, 44 y.o.   MRN: [id number]  HPI The visit was cancelled    Review of Systems     Objective:   Physical Exam        Assessment & Plan:

## 2020-05-09 ENCOUNTER — Encounter: Payer: Self-pay | Admitting: *Deleted

## 2020-05-09 NOTE — Progress Notes (Signed)
Plan for admission for cycle 2 on 05/26/20. Will schedule admission and covid screen the week prior.   Oncology Nurse Navigator Documentation  Oncology Nurse Navigator Flowsheets 05/09/2020  Abnormal Finding Date -  Confirmed Diagnosis Date -  Diagnosis Status -  Planned Course of Treatment -  Phase of Treatment -  Chemotherapy Actual Start Date: -  Navigator Follow Up Date: 05/16/2020  Navigator Follow Up Reason: Follow-up Appointment  Navigator Location [facility]  Navigator Encounter Type MyChart  Treatment Initiated Date -  Patient Visit Type MedOnc  Treatment Phase Active Tx  Barriers/Navigation Needs Coordination of Care;Education;Employed;Long Distance To Travel  Education -  Interventions Disability/FMLA  Acuity Level 3-Moderate Needs (3-4 Barriers Identified)  Coordination of Care Other  Education Method -  Support Groups/Services Friends and Family  Time Spent with Patient 30

## 2020-05-13 ENCOUNTER — Encounter: Payer: Self-pay | Admitting: *Deleted

## 2020-05-13 ENCOUNTER — Inpatient Hospital Stay: Payer: BC Managed Care – PPO | Admitting: Nutrition

## 2020-05-13 ENCOUNTER — Telehealth: Payer: Self-pay | Admitting: Nutrition

## 2020-05-13 NOTE — Progress Notes (Signed)
See telephone note.

## 2020-05-13 NOTE — Telephone Encounter (Signed)
Telephone appointment with patient and wife. 44 year old male diagnosed with Large B Cell Lymphoma. He is a patient of Dr. Janann August R-EPOCH with plan for admission for cycle 2 on Oct 25.  PMH includes OSA.  Medications include Zofran and Compazine.  Labs include Glucose 215 and Alb 2.8.  Height: 5'9". Weight: 212.52 pounds on Oct 6. UBW: 200-210 pounds. BMI: 31.38.  Patient reports he did not experience any nutrition impact symptoms with his first cycle of treatment. He describes himself as a picky eater and is particular about textures. He is drinking Ensure and eating a bagel or waffle at breakfast. He has small lunch and dinner and an afternoon snack. He would like to work more vegetables into his diet. He had questions about sugar and cancer.  Nutrition Diagnosis: Food and Nutrition Related Knowledge Deficit related to Lymphoma and associated treatments as evidenced by no prior need for nutrition related information.  Intervention: Educated on importance of healthy plant based diet with adequate calories and protein for weight maintenance. Educated on specifics about sugar and cancer. Recommended original Ensure once daily. Will mail fact sheets and coupons to home address.  Contact information provided  Monitoring, Evaluation, Goals: Maintain adequate weight and minimize nutrition impact symptoms  Next visit: Patient will contact me for questions and concerns.

## 2020-05-14 ENCOUNTER — Other Ambulatory Visit: Payer: Self-pay | Admitting: *Deleted

## 2020-05-14 ENCOUNTER — Encounter: Payer: Self-pay | Admitting: *Deleted

## 2020-05-14 MED ORDER — CYCLOBENZAPRINE HCL 10 MG PO TABS: 10.0000 mg | ORAL_TABLET | Freq: Three times a day (TID) | ORAL | 2 refills | Status: DC | PRN

## 2020-05-14 MED ORDER — AMOXICILLIN 500 MG PO TABS: 2000.0000 mg | ORAL_TABLET | Freq: Once | ORAL | 0 refills | Status: AC

## 2020-05-14 NOTE — Progress Notes (Signed)
Oncology Nurse Navigator Documentation  Oncology Nurse Navigator Flowsheets 05/14/2020  Abnormal Finding Date -  Confirmed Diagnosis Date -  Diagnosis Status -  Planned Course of Treatment -  Phase of Treatment -  Chemotherapy Actual Start Date: -  Navigator Follow Up Date: 05/15/2020  Navigator Follow Up Reason: Scan Review  Navigator Location [facility]  Navigator Encounter Type MyChart  Treatment Initiated Date -  Patient Visit Type MedOnc  Treatment Phase Active Tx  Barriers/Navigation Needs Coordination of Care;Education;Employed;Long Distance To Travel  Education Pain/ Symptom Management;Other  Interventions Education;Medication Assistance;Psycho-Social Support  Acuity Level 3-Moderate Needs (3-4 Barriers Identified)  Coordination of Care -  Education Method Written  Support Groups/Services Friends and Family  Time Spent with Patient 26

## 2020-05-15 ENCOUNTER — Other Ambulatory Visit: Payer: Self-pay | Admitting: *Deleted

## 2020-05-15 ENCOUNTER — Other Ambulatory Visit: Payer: Self-pay

## 2020-05-15 ENCOUNTER — Ambulatory Visit: Payer: BC Managed Care – PPO | Admitting: Pulmonary Disease

## 2020-05-15 ENCOUNTER — Ambulatory Visit: Payer: BC Managed Care – PPO | Admitting: Hematology & Oncology

## 2020-05-15 ENCOUNTER — Other Ambulatory Visit: Payer: BC Managed Care – PPO

## 2020-05-15 ENCOUNTER — Encounter: Payer: Self-pay | Admitting: Pulmonary Disease

## 2020-05-15 ENCOUNTER — Encounter
Admission: RE | Admit: 2020-05-15 | Discharge: 2020-05-15 | Disposition: A | Discharge status: Home / Self Care | Payer: BC Managed Care – PPO | Source: Ambulatory Visit | Attending: Hematology & Oncology | Admitting: Hematology & Oncology

## 2020-05-15 ENCOUNTER — Encounter: Payer: Self-pay | Admitting: *Deleted

## 2020-05-15 VITALS — BP 110/72 | HR 111 | Temp 97.3°F | Ht 69.0 in | Wt 187.4 lb

## 2020-05-15 DIAGNOSIS — C8332 Diffuse large B-cell lymphoma, intrathoracic lymph nodes: Secondary | ICD-10-CM

## 2020-05-15 DIAGNOSIS — R059 Cough, unspecified: Secondary | ICD-10-CM

## 2020-05-15 DIAGNOSIS — Z8709 Personal history of other diseases of the respiratory system: Secondary | ICD-10-CM

## 2020-05-15 DIAGNOSIS — C8582 Other specified types of non-Hodgkin lymphoma, intrathoracic lymph nodes: Secondary | ICD-10-CM

## 2020-05-15 DIAGNOSIS — Z5111 Encounter for antineoplastic chemotherapy: Secondary | ICD-10-CM

## 2020-05-15 LAB — GLUCOSE, CAPILLARY: Glucose-Capillary: 90 mg/dL (ref 70–99)

## 2020-05-15 MED ORDER — FLUDEOXYGLUCOSE F - 18 (FDG) INJECTION
9.7000 | Freq: Once | INTRAVENOUS | Status: AC | PRN
  Administered 2020-05-15: 10.51 via INTRAVENOUS

## 2020-05-15 NOTE — Assessment & Plan Note (Signed)
Plan: Continue follow-up with oncology 

## 2020-05-15 NOTE — Assessment & Plan Note (Signed)
Completed PET scan earlier today Stable clinical exam Patient reporting clinical improvement since being discharged  Plan: We'll continue to clinically monitor We'll have patient establish with Dr. Valeta Harms in 8 weeks and 30-minute time slot Continue follow-up with oncology

## 2020-05-15 NOTE — Progress Notes (Signed)
@Patient  ID: Travis Duran, male    DOB: October 20, 1975, 44 y.o.   MRN: [id number]  Chief Complaint  Patient presents with  . Follow-up    non productive cough about 2 months    Referring provider: Laurey Morale, MD  HPI:  44 year old male never smoker followed in our office for lung mass, large B-cell lymphoma  PMH: Lymphoma large cell, large B-cell lymphoma Smoker/ Smoking History: Never smoker Maintenance:   Pt of: Needs outpatient pulmonary provider  05/15/2020  - Visit   44 year old male never smoker initially consulted with our practice when admitted to the hospital in September/2021 for evaluation of lung mass.  And large left pleural effusion.  Patient had 3 thoracentesis.  An excerpt of that discharge summary is listed below:  Admit date: 04/24/2020 Discharge date: 04/30/2020  Admitted From: Home Disposition: Home  Recommendations for Outpatient Follow-up:  1. Follow up with PCP in 1-2 weeks 2. Please obtain BMP/CBC in one week your next doctors visit.  3. Follow-up patient with Dr. Marin Olp  Home Health: Equipment/Devices: Discharge Condition: Stable CODE STATUS:  Diet recommendation:   Brief/Interim Summary:  44 years old male with medical history significant for osteoarthritis, Erectile dysfunction, obstructive sleep apnea who developed exertional shortness of breath, and cough for 6 weeks. He was treated with amoxicillin, Covid test was negative, PCP has ordered CT chest found to have left upper lobe mass with significant pleural effusion. Patient was sent to the emergency department. Patient is admitted for left lung mass associated with large pleural effusion. Patient underwent thoracocentesis x 3, tolerated well. Pulmonology consulted, patient underwent bronchoscopy and biopsy was obtained. CT Abdomen showed lesion in the liver, MRCP ordered.MRCP showed 1.6 cm complex posterior right hepatic cyst and large anterior mediastinal mass.  Patient started  doing well medically.  After discussion between oncology and radiology patient underwent biopsy of his anterior mass thereafter remained medically stable and was discharged.  Patient was to follow-up outpatient with Dr. Marin Olp regarding biopsy results and further follow-up care.  Patient and his wife were made aware of his clinical care.  All the questions were answered.   Assessment & Plan:  Principal Problem: Mass of left lung Active Problems: OBSTRUCTIVE SLEEP APNEA Pleural effusion History of thoracentesis   Left lung mass with left-sided pleural effusion, malignant Large anterior mediastinal lymphadenopathy -Suspicion for malignancy therefore oncology consulted. Seen by pulmonary status post thoracentesis. Cytology results from the fluid analysis is pending At the time of discharge -Oncology recommended CT-guided biopsy.  Case discussed between oncology and IR, patient had biopsy performed prior to his discharge.  These results were pending and were to be followed up outpatient oncology -MRCP-1.6 cm complex cyst in the posterior right hepatic lobe and large anterior mediastinal mass with malignant pleural effusion  Obstructive sleep apnea:Continue CPAP if needed in the hospital.  Insomnia As needed trazodone  Body mass index is 31.06 kg/m.   Patient has establish care with oncology Dr. Marin Olp.  Patient was last seen in their office on 05/01/2020.  The impression and plan from that visit is listed below:  Impression and Plan: Travis Duran is a very nice 44 year old white male.  He has a primary mediastinal large B-cell lymphoma.  I must say this is quite large tumor.  I suppose he may have some pleural involvement given that he has the pleural effusion that was malignant.  I still feel that this is a curable tumor.  With the [facility] regimen, we certainly might be able  to utilize systemic chemotherapy without being radiation therapy.  I spent a good hour  with he and his wife.  They are both very very nice.  I explained what kind of lymphoma this was.  I gave them information sheets about the protocol.  We will be able to get this started tomorrow.  Spoke with interventional radiology.  Dr. Vernard Gambles will try to get him on the schedule for it in the morning at [facility] for a PICC line.  He will then be admitted to the hospital and start chemotherapy that day.  Hopefully, we will find that within a week or so, he will start having decreased symptoms.  His cough hopefully will be a lot better.  We will have to get a PET scan set up on him as an outpatient.  I will plan on his next cycle of chemotherapy to start on October 25.  Following his appointment to establish care with Dr. Marin Olp he was readmitted to the hospital to initiate treatment for his lymphoma.  That documentation is listed below:  Admit date: 05/02/2020 Discharge date: 05/07/2020  Discharge Diagnoses:  Active Problems:   Lymphoma, large cell, intrathoracic lymph nodes (HCC)   Encounter for antineoplastic chemotherapy   Diffuse large B cell lymphoma ([place])  Patient presenting to office today as a hospital follow-up.  Patient recently completed a PET scan today prior to this visit.  Patient has been struggling with back pain and tooth pain.  This is currently being managed by oncology with Hycodan as well as Flexeril.  He reports this has been helpful.  He has not found Delsym or Tessalon Perles helpful for management of his cough.  Overall he feels that his shortness of breath and cough have improved since being discharged from the hospital.  He still has shortness of breath but is not as severe as it was prior to his hospitalization.   Questionaires / Pulmonary Flowsheets:   ACT:  No flowsheet data found.  MMRC: mMRC Dyspnea Scale mMRC Score  05/15/2020 2    Epworth:  No flowsheet data found.  Tests:   FENO:  No results found for:  NITRICOXIDE  PFT: No flowsheet data found.  WALK:  No flowsheet data found.  Imaging: DG Chest 1 View  Result Date: 05/07/2020 CLINICAL DATA:  Left thoracentesis.  Mediastinal lymph [place]. EXAM: CHEST  1 VIEW COMPARISON:  Chest x-ray dated May 05, 2020. FINDINGS: Unchanged right upper extremity PICC line. Stable cardiomediastinal silhouette with mediastinal widening corresponding to the known anterior mediastinal mass. Slightly decreased rightward mediastinal shift. Decreased now small to moderate left pleural effusion with improved aeration of the left lung. The right lung is clear. No acute osseous abnormality. IMPRESSION: 1. Improved aeration of the left lung status post thoracentesis. Residual small to moderate left pleural effusion. No pneumothorax. Electronically Signed   By: Titus Dubin M.D.   On: 05/07/2020 13:17   DG Chest 1 View  Result Date: 05/05/2020 CLINICAL DATA:  Pleural effusion EXAM: CHEST  1 VIEW COMPARISON:  April 26, 2020 FINDINGS: Large pleural effusion on the left is present, increased in size from most recent study. There is likely consolidation and atelectasis superimposed on this large effusion. The right lung is clear. There is cardiomegaly with pulmonary vascularity grossly normal. Central catheter tip is in the superior vena cava. No pneumothorax. No adenopathy evident in areas where potential adenopathy can be assessed by radiography. No bone lesions. IMPRESSION: Large left pleural effusion with opacification of most  of the left hemithorax. Suspect underlying atelectasis and/or consolidation. Right lung clear. Stable cardiomegaly. Central catheter tip in superior vena cava. Electronically Signed   By: Lowella Grip III M.D.   On: 05/05/2020 11:22   DG Chest 1 View  Result Date: 04/25/2020 CLINICAL DATA:  Post video bronchoscopy. EXAM: CHEST  1 VIEW COMPARISON:  Chest x-ray dated 04/25/2020 FINDINGS: There is some improved aeration in the left zone  however, there is persistent diffuse opacification of the left hemithorax. The cardiac silhouette is difficult to fully evaluate on this study but appears similar to prior study. A large mediastinal mass is again noted. There is no pneumothorax. There is a persistent at least moderate-sized left-sided pleural effusion. IMPRESSION: 1. No pneumothorax. 2. Persistent diffuse opacification of the left hemithorax. 3. Persistent large mediastinal mass. 4. Persistent at least moderate-sized left-sided pleural effusion. Electronically Signed   By: Constance Holster M.D.   On: 04/25/2020 16:58   DG Chest 1 View  Result Date: 04/25/2020 CLINICAL DATA:  Shortness of breath.  Status post thoracentesis EXAM: CHEST  1 VIEW COMPARISON:  Chest CT April 24, 2020. FINDINGS: No pneumothorax. Large partially loculated pleural effusion remains on the left. Opacity more medially on the left may well represent a large mass. A degree of atelectasis and consolidation in association with apparent mass cannot be excluded. Smaller pleural effusions seen on the right on chest CT from 1 day prior is not appreciable. There is mild atelectasis in the medial right base. Right lung otherwise is clear. Heart appears mildly enlarged. There is no longer appreciable impression on the heart as was noted on CT from 1 day prior. Pulmonary vascularity appears grossly normal. No adenopathy. No bone lesions. IMPRESSION: Persistent sizable pleural effusion on the left although smaller than on CT examination from 1 day prior. The heart no longer appears significantly compressed as was noted on CT from 1 day prior. Mass seen on CT medial to the effusion persists. There may be associated atelectasis and consolidation in this area. Right lung appears clear except for atelectasis in the medial right base. Heart appears mildly enlarged. Electronically Signed   By: Lowella Grip III M.D.   On: 04/25/2020 09:53   CT Chest W Contrast  Result Date:  04/24/2020 CLINICAL DATA:  Left pleural effusion. Dyspnea with exertion for 1 month. Chronic cough. EXAM: CT CHEST WITH CONTRAST TECHNIQUE: Multidetector CT imaging of the chest was performed during intravenous contrast administration. CONTRAST:  57mL ISOVUE-300 IOPAMIDOL (ISOVUE-300) INJECTION 61% COMPARISON:  04/03/2020 chest radiograph. FINDINGS: Cardiovascular: Normal heart size. Small pericardial effusion with suggestion of mild pericardial thickening and enhancement. Great vessels are normal in course and caliber. No central pulmonary emboli. Mediastinum/Nodes: Mildly heterogeneous thyroid gland without discrete thyroid nodules. Unremarkable esophagus. No axillary adenopathy. Large heterogeneously enhancing poorly marginated solid 14.6 x 11.7 x 18.5 cm left anterior mediastinal mass (series 2/image 62) with prominent mass-effect on the right shifted great vessels and heart, extending superiorly nearly to the thoracic inlet and inferiorly to the left pericardiophrenic region, contiguous with the left hilum and potentially directly invading the left upper lung. No discrete hilar adenopathy. Lungs/Pleura: No pneumothorax. Small dependent right pleural effusion. Large left pleural effusion. Widespread enhancing left pleural nodularity, for example measuring 2.6 cm at the anterior left apex (series 2/image 33) and 2.8 cm posteriorly and inferiorly in the left pleural space (series 2/image 131). Complete left lower lobe and near complete left upper lobe atelectasis. No additional significant pulmonary nodules. Upper abdomen: No  acute abnormality. Musculoskeletal:  No aggressive appearing focal osseous lesions. IMPRESSION: 1. Large malignant appearing heterogeneously enhancing poorly marginated solid 14.6 x 11.7 x 18.5 cm left anterior mediastinal mass with prominent mass-effect on the right shifted great vessels and heart, extending superiorly nearly to the thoracic inlet and inferiorly to the left pericardiophrenic  region, contiguous with the left hilum and potentially directly invading the left upper lung. Differential considerations include primary thymic malignancy, primary bronchogenic carcinoma or less likely lymphoma. 2. Widespread enhancing left pleural nodularity compatible with pleural metastatic disease. 3. Large left pleural effusion with near complete left lung atelectasis. 4. Small dependent right pleural effusion. 5. Small pericardial effusion with suggestion of mild pericardial thickening and enhancement, cannot exclude malignant pericardial effusion. These results were called by telephone at the time of interpretation on 04/24/2020 at 2:41 pm to Gaines, RN in the office of provider Alysia Penna, who verbally acknowledged these results. The patient will be transported to the ED via ambulance from the imaging center for further evaluation. Electronically Signed   By: Ilona Sorrel M.D.   On: 04/24/2020 15:07   CT ABDOMEN PELVIS W CONTRAST  Result Date: 04/26/2020 CLINICAL DATA:  Newly diagnosed non-small cell lung carcinoma. Staging. EXAM: CT ABDOMEN AND PELVIS WITH CONTRAST TECHNIQUE: Multidetector CT imaging of the abdomen and pelvis was performed using the standard protocol following bolus administration of intravenous contrast. CONTRAST:  152mL OMNIPAQUE IOHEXOL 300 MG/ML  SOLN COMPARISON:  Chest CT on 04/24/2020 FINDINGS: Lower Chest: Large anterior mediastinal mass and small bilateral pleural effusions are again seen, as better visualized on recent chest CT. Hepatobiliary: A 2 cm hypovascular lesion is seen in the posterior right hepatic lobe, which has nonspecific features. No other liver masses are identified. Gallbladder is unremarkable. No evidence of biliary ductal dilatation. Pancreas:  No mass or inflammatory changes. Spleen: Within normal limits in size and appearance. Adrenals/Urinary Tract: No masses identified. No evidence of ureteral calculi or hydronephrosis. Stomach/Bowel: No evidence of  obstruction, inflammatory process or abnormal fluid collections. Vascular/Lymphatic: No pathologically enlarged lymph nodes. No abdominal aortic aneurysm. Reproductive:  No mass or other significant abnormality. Other:  None. Musculoskeletal:  No suspicious bone lesions identified. IMPRESSION: 2 cm nonspecific hypovascular lesion in posterior right hepatic lobe. Recommend abdomen MRI without and with contrast for further characterization. No other sites of metastatic disease identified within the abdomen or pelvis. Large anterior mediastinal mass and bilateral pleural effusions, as better demonstrated on recent chest CT. Electronically Signed   By: Marlaine Hind M.D.   On: 04/26/2020 14:15   CT BIOPSY  Result Date: 04/28/2020 INDICATION: 44 year old male with large anterior mediastinal mass suspected lymphoma. Core biopsy requested for additional tissue for diagnostic purposes. EXAM: CT BIOPSY COMPARISON:  None. MEDICATIONS: None. ANESTHESIA/SEDATION: Fentanyl 2 mcg IV; Versed 50 mg IV Sedation time: 26 minutes; The patient was continuously monitored during the procedure by the interventional radiology nurse under my direct supervision. CONTRAST:  None. COMPLICATIONS: None immediate. PROCEDURE: Informed consent was obtained from the patient following an explanation of the procedure, risks, benefits and alternatives. A time out was performed prior to the initiation of the procedure. The patient was positioned supine on the CT table and a limited CT was performed for procedural planning demonstrating similar appearance of large anterior mediastinal soft tissue mass. The procedure was planned. The operative site was prepped and draped in the usual sterile fashion. Appropriate trajectory was confirmed with a 22 gauge spinal needle after the adjacent tissues were anesthetized with  1% Lidocaine with epinephrine. Under intermittent CT guidance, a 17 gauge coaxial needle was advanced into the peripheral aspect of the  mass. Appropriate positioning was confirmed and a total of 5 samples were obtained with an 18 gauge core needle biopsy device. The co-axial needle was removed while simultaneously injecting a Gel-Foam slurry and hemostasis was achieved with manual compression. A limited postprocedural CT was negative for hemorrhage or additional complication. A dressing was placed. The patient tolerated the procedure well without immediate postprocedural complication. IMPRESSION: Technically successful CT guided core needle biopsy of anterior mediastinal mass. Electronically Signed   By: Ruthann Cancer MD   On: 04/28/2020 16:38   DG Chest Port 1 View  Result Date: 04/26/2020 CLINICAL DATA:  Status post thoracentesis. EXAM: PORTABLE CHEST 1 VIEW COMPARISON:  Radiograph yesterday.  Chest CT 04/24/2020 FINDINGS: Decreased size of left pleural effusion after thoracentesis with increasing aeration decreasing hazy opacity. There is persistent blunting of the medial and lateral costophrenic angles and loculated fluid tracking at the apex. No definite pneumothorax. Slight residual rightward tracheal deviation. Heart size and mediastinal contours are obscured, however well-defined density in the left hemithorax likely corresponds to anterior mediastinal mass on chest CT. The right lung is clear. IMPRESSION: 1. Decreased size of left pleural effusion after thoracentesis with increasing aeration in the left hemithorax and decreasing hazy opacity. No definite pneumothorax. 2. Small volume of persistent partially loculated left pleural fluid. Electronically Signed   By: Keith Rake M.D.   On: 04/26/2020 15:38   MR ABDOMEN MRCP W WO CONTAST  Result Date: 04/27/2020 CLINICAL DATA:  Non-small cell lung carcinoma. Indeterminate liver lesion on recent CT. Staging. EXAM: MRI ABDOMEN WITHOUT AND WITH CONTRAST (INCLUDING MRCP) TECHNIQUE: Multiplanar multisequence MR imaging of the abdomen was performed both before and after the  administration of intravenous contrast. Heavily T2-weighted images of the biliary and pancreatic ducts were obtained, and three-dimensional MRCP images were rendered by post processing. CONTRAST:  40mL GADAVIST GADOBUTROL 1 MMOL/ML IV SOLN COMPARISON:  CT on 04/26/2020 FINDINGS: Lower chest: Large anterior mediastinal mass and malignant left pleural effusion with numerous pleural based soft tissue masses, as better demonstrated on recent chest CT. Hepatobiliary: Image degradation by motion artifact noted. A solitary mildly complex cystic lesion is seen in the posterior right hepatic lobe which measures 1.6 cm. This shows a mildly lobulated contour and a few peripheral septations, but does not have a solid or enhancing component, and does not have typical features of a metastasis. No other liver lesions are identified. Gallbladder is unremarkable. No evidence of biliary ductal dilatation. Pancreas:  No mass or inflammatory changes. Spleen:  Within normal limits in size and appearance. Adrenals/Urinary Tract: No adrenal or renal masses identified. No evidence of hydronephrosis. Stomach/Bowel: Visualized portion unremarkable. Vascular/Lymphatic: No pathologically enlarged lymph nodes identified. No abdominal aortic aneurysm. Other:  None. Musculoskeletal:  No suspicious bone lesions identified. IMPRESSION: 1.6 cm complex cyst in the posterior right hepatic lobe. No evidence of hepatic or other abdominal metastatic disease. Large anterior mediastinal mass and malignant left pleural effusion , as better demonstrated on recent chest CT. Electronically Signed   By: Marlaine Hind M.D.   On: 04/27/2020 08:26   ECHOCARDIOGRAM COMPLETE  Result Date: 04/27/2020    ECHOCARDIOGRAM REPORT   Patient Name:   DONNELL WION Date of Exam: 04/27/2020 Medical Rec #:  [id number]    Height:       69.0 in Accession #:    [id number]   Weight:  210.3 lb Date of Birth:  [birth year]     BSA:          2.111 m Patient Age:    50 years     BP:            117/80 mmHg Patient Gender: M            HR:           105 bpm. Exam Location:  Inpatient Procedure: 2D Echo, Color Doppler, Cardiac Doppler and Strain Analysis Indications:    Pre-Chemo Evaluation  History:        Patient has no prior history of Echocardiogram examinations.                 Risk Factors:Sleep Apnea. Mediastinal mass.  Sonographer:    Raquel Sarna Senior RDCS Referring Phys: Johnson Comments: Technically challenging due to shadowing from mass and shifted heart position in chest. IMPRESSIONS  1. Left ventricular ejection fraction, by estimation, is 60 to 65%. The left ventricle has normal function. The left ventricle has no regional wall motion abnormalities. Left ventricular diastolic parameters are consistent with Grade I diastolic dysfunction (impaired relaxation). The average left ventricular global longitudinal strain is -17.3 %. The global longitudinal strain is normal.  2. Right ventricular systolic function is normal. The right ventricular size is normal.  3. The mitral valve is normal in structure. No evidence of mitral valve regurgitation. No evidence of mitral stenosis.  4. The aortic valve is tricuspid. Aortic valve regurgitation is not visualized. No aortic stenosis is present.  5. The inferior vena cava is dilated in size with <50% respiratory variability, suggesting right atrial pressure of 15 mmHg. FINDINGS  Left Ventricle: Left ventricular ejection fraction, by estimation, is 60 to 65%. The left ventricle has normal function. The left ventricle has no regional wall motion abnormalities. The average left ventricular global longitudinal strain is -17.3 %. The global longitudinal strain is normal. The left ventricular internal cavity size was normal in size. There is no left ventricular hypertrophy. Left ventricular diastolic parameters are consistent with Grade I diastolic dysfunction (impaired relaxation). Right Ventricle: The right ventricular size is  normal. No increase in right ventricular wall thickness. Right ventricular systolic function is normal. Left Atrium: Left atrial size was normal in size. Right Atrium: Right atrial size was normal in size. Pericardium: There is no evidence of pericardial effusion. Mitral Valve: The mitral valve is normal in structure. No evidence of mitral valve regurgitation. No evidence of mitral valve stenosis. Tricuspid Valve: The tricuspid valve is normal in structure. Tricuspid valve regurgitation is not demonstrated. No evidence of tricuspid stenosis. Aortic Valve: The aortic valve is tricuspid. Aortic valve regurgitation is not visualized. No aortic stenosis is present. Pulmonic Valve: The pulmonic valve was normal in structure. Pulmonic valve regurgitation is not visualized. No evidence of pulmonic stenosis. Aorta: The aortic root is normal in size and structure. Venous: The inferior vena cava is dilated in size with less than 50% respiratory variability, suggesting right atrial pressure of 15 mmHg. IAS/Shunts: No atrial level shunt detected by color flow Doppler. Additional Comments: Mass noted anterior to the right ventricle and apex.  LEFT VENTRICLE PLAX 2D LVOT diam:     2.20 cm LV SV:         65       2D Longitudinal Strain LV SV Index:   31       2D Strain GLS (A2C):   -15.8 %  LVOT Area:     3.80 cm 2D Strain GLS (A3C):   -18.4 %                         2D Strain GLS (A4C):   -17.8 %                         2D Strain GLS Avg:     -17.3 % RIGHT VENTRICLE RV S prime:     10.00 cm/s TAPSE (M-mode): 1.5 cm LEFT ATRIUM             Index       RIGHT ATRIUM           Index LA Vol (A2C):   25.9 ml 12.27 ml/m RA Area:     13.40 cm LA Vol (A4C):   17.6 ml 8.34 ml/m  RA Volume:   32.00 ml  15.16 ml/m LA Biplane Vol: 21.9 ml 10.38 ml/m  AORTIC VALVE LVOT Vmax:   97.30 cm/s LVOT Vmean:  74.600 cm/s LVOT VTI:    0.170 m  AORTA Ao Root diam: 3.30 cm Ao Asc diam:  3.30 cm  SHUNTS Systemic VTI:  0.17 m Systemic Diam: 2.20 cm  Skeet Latch MD Electronically signed by Skeet Latch MD Signature Date/Time: 04/27/2020/12:40:00 PM    Final    IR IMAGING GUIDED PORT INSERTION  Result Date: 05/07/2020 CLINICAL DATA:  Lymphoma, needs durable venous access for planned treatment regimen. EXAM: TUNNELED PORT CATHETER PLACEMENT WITH ULTRASOUND AND FLUOROSCOPIC GUIDANCE FLUOROSCOPY TIME:  6 seconds; 2 mGy ANESTHESIA/SEDATION: Intravenous Fentanyl 127mcg and Versed 4mg  were administered as conscious sedation during continuous monitoring of the patient's level of consciousness and physiological / cardiorespiratory status by the radiology RN, with a total moderate sedation time of 17 minutes. TECHNIQUE: The procedure, risks, benefits, and alternatives were explained to the patient. Questions regarding the procedure were encouraged and answered. The patient understands and consents to the procedure. As antibiotic prophylaxis, cefazolin 2 g was ordered pre-procedure and administered intravenously within one hour of incision. Patency of the right IJ vein was confirmed with ultrasound with image documentation. An appropriate skin site was determined. Skin site was marked. Region was prepped using maximum barrier technique including cap and mask, sterile gown, sterile gloves, large sterile sheet, and Chlorhexidine as cutaneous antisepsis. The region was infiltrated locally with 1% lidocaine. Under real-time ultrasound guidance, the right IJ vein was accessed with a 21 gauge micropuncture needle; the needle tip within the vein was confirmed with ultrasound image documentation. Needle was exchanged over a 018 guidewire for transitional dilator, and vascular measurement was performed. A small incision was made on the right anterior chest wall and a subcutaneous pocket fashioned. The power-injectable port was positioned and its catheter tunneled to the right IJ dermatotomy site. The transitional dilator was exchanged over an Amplatz wire for a  peel-away sheath, through which the port catheter, which had been trimmed to the appropriate length, was advanced and positioned under fluoroscopy with its tip at the cavoatrial junction. Spot chest radiograph confirms good catheter position and no pneumothorax. The port was flushed per protocol. The pocket was closed with deep interrupted and subcuticular continuous 3-0 Monocryl sutures. The incisions were covered with Dermabond then covered with a sterile dressing. The patient tolerated the procedure well. COMPLICATIONS: COMPLICATIONS None immediate IMPRESSION: Technically successful right IJ power-injectable port catheter placement. Ready for routine use. Electronically Signed   By: Keturah Barre  Vernard Gambles M.D.   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 For this, he requires durable venous access and presents for PICC placement. EXAM: PICC LINE PLACEMENT WITH ULTRASOUND AND FLUOROSCOPIC GUIDANCE MEDICATIONS: None. ANESTHESIA/SEDATION: None. FLUOROSCOPY TIME:  Fluoroscopy Time: 0 minutes 24 seconds (7 mGy). COMPLICATIONS: None immediate. PROCEDURE: The patient was advised of the possible risks and complications and agreed to undergo the procedure. The patient was then brought to the angiographic suite for the procedure. The right/left arm was prepped with chlorhexidine, draped in the usual sterile fashion using maximum barrier technique (cap and mask, sterile gown, sterile gloves, large sterile sheet, hand hygiene and cutaneous antisepsis) and infiltrated locally with 1% Lidocaine. Ultrasound demonstrated patency of the right basilic vein, and this was documented with an image. Under real-time ultrasound guidance, this vein was accessed with a 21 gauge micropuncture needle and image documentation was performed. A 0.018 wire was introduced in to the vein. Over this, a 6 [place]  dual lumen power injectable PICC was advanced to the lower SVC/right atrial junction. Fluoroscopy during the procedure and fluoro spot radiograph confirms appropriate catheter position. The catheter was flushed and covered with sterile dressing. Catheter length: 42 cm IMPRESSION: Successful right arm power PICC line placement with ultrasound and fluoroscopic guidance. The catheter is ready for use. Electronically Signed   By: Jacqulynn Cadet M.D.   On: 05/02/2020 08:52   IR THORACENTESIS ASP PLEURAL SPACE W/IMG GUIDE  Result Date: 05/07/2020 INDICATION: Patient with a history of lymphoma and recurrent left pleural effusion. Interventional radiology asked to perform a therapeutic thoracentesis. EXAM: ULTRASOUND GUIDED THORACENTESIS MEDICATIONS: 1% lidocaine 5 mL COMPLICATIONS: None immediate.  No pneumothorax on follow-up radiograph. PROCEDURE: An ultrasound guided thoracentesis was thoroughly discussed with the patient and questions answered. The benefits, risks, alternatives and complications were also discussed. The patient understands and wishes to proceed with the procedure. Written consent was obtained. Ultrasound was performed to localize and mark an adequate pocket of fluid in the left chest. The area was then prepped and draped in the normal sterile fashion. 1% Lidocaine was used for local anesthesia. Under ultrasound guidance a 6 Fr Safe-T-Centesis catheter was introduced. Thoracentesis was performed. The catheter was removed and a dressing applied. FINDINGS: A total of approximately 1.6 L of dark yellow fluid was removed. IMPRESSION: Successful ultrasound guided left thoracentesis yielding 1.6 L of pleural fluid. Read by: Soyla Dryer, NP Electronically Signed   By: Lucrezia Europe M.D.   On: 05/07/2020 13:18   IR THORACENTESIS ASP PLEURAL SPACE W/IMG GUIDE  Result Date: 04/25/2020 INDICATION: Patient with newly diagnosed lung mass with large left pleural effusion presents today for a therapeutic  and diagnostic thoracentesis. EXAM: ULTRASOUND GUIDED THORACENTESIS MEDICATIONS: 1% lidocaine 10 COMPLICATIONS: None immediate. PROCEDURE: An ultrasound guided thoracentesis was thoroughly discussed with the patient and questions answered. The benefits, risks, alternatives and complications were also discussed. The patient understands and wishes to proceed with the procedure. Written consent was obtained. Ultrasound was performed to localize and mark an adequate pocket of fluid in the left chest. The area was then prepped and draped in the normal sterile fashion. 1% Lidocaine was used for local anesthesia. Under ultrasound guidance a 6 Fr Safe-T-Centesis catheter was introduced. Thoracentesis was performed. The catheter was removed and a dressing applied. FINDINGS: A total of approximately 1.8 L of dark yellow fluid was removed.  Samples were sent to the laboratory as requested by the clinical team. IMPRESSION: Successful ultrasound guided left thoracentesis yielding 1.8 L of pleural fluid. Read by: Soyla Dryer, NP Electronically Signed   By: Jacqulynn Cadet M.D.   On: 04/25/2020 10:03    Lab Results:  CBC    Component Value Date/Time   WBC 10.5 05/07/2020 0049   RBC 4.10 (L) 05/07/2020 0049   HGB 11.7 (L) 05/07/2020 0049   HGB 14.1 05/01/2020 1442   HCT 35.6 (L) 05/07/2020 0049   PLT 338 05/07/2020 0049   PLT 458 (H) 05/01/2020 1442   MCV 86.8 05/07/2020 0049   MCH 28.5 05/07/2020 0049   MCHC 32.9 05/07/2020 0049   RDW 13.6 05/07/2020 0049   LYMPHSABS 0.1 (L) 05/07/2020 0049   MONOABS 0.0 (L) 05/07/2020 0049   EOSABS 0.0 05/07/2020 0049   BASOSABS 0.0 05/07/2020 0049    BMET    Component Value Date/Time   NA 135 05/07/2020 0049   K 3.6 05/07/2020 0049   CL 102 05/07/2020 0049   CO2 25 05/07/2020 0049   GLUCOSE 215 (H) 05/07/2020 0049   BUN 20 05/07/2020 0049   CREATININE 0.95 05/07/2020 0049   CREATININE 1.23 05/01/2020 1442   CALCIUM 8.9 05/07/2020 0049   GFRNONAA >60  05/07/2020 0049   GFRNONAA >60 05/01/2020 1442   GFRAA >60 05/06/2020 0531   GFRAA >60 05/01/2020 1442    BNP No results found for: BNP  ProBNP No results found for: PROBNP  Specialty Problems      Pulmonary Problems   OBSTRUCTIVE SLEEP APNEA    Qualifier: Diagnosis of  By: Gwenette Greet MD, Armando Reichert       Cough      No Known Allergies  Immunization History  Administered Date(s) Administered  . Influenza,inj,Quad PF,6+ Mos 04/27/2020  . Tdap 12/16/2015    Past Medical History:  Diagnosis Date  . Arthritis of right knee   . ED (erectile dysfunction)   . 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 OSA (obstructive sleep apnea) 2007   with AHI 32/hr    Tobacco History: Social History   Tobacco Use  Smoking Status Never Smoker  Smokeless Tobacco Never Used   Counseling given: Yes   Continue to not smoke  Outpatient Encounter Medications as of 05/15/2020  Medication Sig  . allopurinol (ZYLOPRIM) 100 MG tablet Take 1 tablet (100 mg total) by mouth daily.  Marland Kitchen antiseptic oral rinse (BIOTENE) LIQD 15 mLs by Mouth Rinse route every 4 (four) hours.  . ciprofloxacin (CIPRO) 500 MG tablet Take 1 tablet (500 mg total) by mouth daily with breakfast.  . cyclobenzaprine (FLEXERIL) 10 MG tablet Take 1 tablet (10 mg total) by mouth 3 (three) times daily as needed for muscle spasms.  . famciclovir (FAMVIR) 500 MG tablet Take 1 tablet (500 mg total) by mouth daily.  . fluticasone (FLOVENT HFA) 110 MCG/ACT inhaler Inhale 2 puffs into the lungs in the morning and at bedtime.  Marland Kitchen HYDROcodone-homatropine (HYCODAN) 5-1.5 MG/5ML syrup Take 5 mLs by mouth every 6 (six) hours as needed for cough.  . ondansetron (ZOFRAN) 4 MG tablet Take 1-2 tablets (4-8 mg total) by mouth every 8 (eight) hours as needed for nausea (not responsive to prochlorperazine (COMPAZINE)).  Marland Kitchen prochlorperazine (COMPAZINE) 10 MG tablet Take 1 tablet (10 mg total) by mouth every 6 (six) hours as  needed for nausea or vomiting.  . tadalafil (CIALIS) 10 MG tablet Take 1 tablet (10 mg  total) by mouth daily as needed for erectile dysfunction.   No facility-administered encounter medications on file as of 05/15/2020.     Review of Systems  Review of Systems  Constitutional: Positive for fatigue. Negative for activity change, chills, fever and unexpected weight change.  HENT: Negative for postnasal drip, rhinorrhea, sinus pressure, sinus pain and sore throat.   Eyes: Negative.   Respiratory: Positive for cough. Negative for shortness of breath (not at baseline but improving ) and wheezing.   Cardiovascular: Negative for chest pain and palpitations.  Gastrointestinal: Negative for constipation, diarrhea, nausea and vomiting.  Endocrine: Negative.   Genitourinary: Negative.   Musculoskeletal: Positive for back pain (after coughing ).  Skin: Negative.   Neurological: Negative for dizziness and headaches.  Psychiatric/Behavioral: Negative.  Negative for dysphoric mood. The patient is not nervous/anxious.   All other systems reviewed and are negative.    Physical Exam  BP 110/72 (BP Location: Left Arm, Cuff Size: Normal)   Pulse (!) 111   Temp (!) 97.3 F (36.3 C) (Other (Comment)) Comment (Src): wrist  Ht 5\' 9"  (1.753 m)   Wt 187 lb 6.4 oz (85 kg)   SpO2 96% Comment: Room air  BMI 27.67 kg/m   Wt Readings from Last 5 Encounters:  05/15/20 187 lb 6.4 oz (85 kg)  05/15/20 187 lb (84.8 kg)  05/07/20 212 lb 8.4 oz (96.4 kg)  05/01/20 200 lb (90.7 kg)  04/24/20 210 lb 5.1 oz (95.4 kg)    BMI Readings from Last 5 Encounters:  05/15/20 27.67 kg/m  05/15/20 27.62 kg/m  05/07/20 31.38 kg/m  05/01/20 29.53 kg/m  04/24/20 31.06 kg/m     Physical Exam Vitals and nursing note reviewed.  Constitutional:      General: He is not in acute distress.    Appearance: Normal appearance. He is obese.  HENT:     Head: Normocephalic and atraumatic.     Right Ear: Hearing,  tympanic membrane, ear canal and external ear normal. There is no impacted cerumen.     Left Ear: Hearing, tympanic membrane, ear canal and external ear normal. There is no impacted cerumen.     Nose: Nose normal. No mucosal edema or rhinorrhea.     Right Turbinates: Not enlarged.     Left Turbinates: Not enlarged.     Mouth/Throat:     Mouth: Mucous membranes are dry.     Pharynx: Oropharynx is clear. No oropharyngeal exudate.  Eyes:     Pupils: Pupils are equal, round, and reactive to light.  Cardiovascular:     Rate and Rhythm: Normal rate and regular rhythm.     Pulses: Normal pulses.     Heart sounds: Normal heart sounds. No murmur heard.   Pulmonary:     Effort: Pulmonary effort is normal.     Breath sounds: Normal breath sounds. No decreased breath sounds, wheezing or rales.  Musculoskeletal:     Cervical back: Normal range of motion.     Right lower leg: Edema (trace) present.     Left lower leg: Edema (trace) present.  Lymphadenopathy:     Cervical: No cervical adenopathy.  Skin:    General: Skin is warm and dry.     Capillary Refill: Capillary refill takes less than 2 seconds.     Findings: No erythema or rash.  Neurological:     General: No focal deficit present.     Mental Status: He is alert and oriented to person, place, and time.  Motor: No weakness.     Coordination: Coordination normal.     Gait: Gait is intact. Gait normal.  Psychiatric:        Mood and Affect: Mood normal.        Behavior: Behavior normal. Behavior is cooperative.        Thought Content: Thought content normal.        Judgment: Judgment normal.       Assessment & Plan:   Lymphoma, large cell, intrathoracic lymph nodes (HCC) Plan: Continue follow-up with oncology  History of pleural effusion Completed PET scan earlier today Stable clinical exam Patient reporting clinical improvement since being discharged  Plan: We'll continue to clinically monitor We'll have patient  establish with Dr. Valeta Harms in 8 weeks and 30-minute time slot Continue follow-up with oncology  Diffuse large B cell lymphoma ([place]) Plan: Continue follow-up with oncology  Cough Plan: Okay to continue Hycodan cough syrup as managed by oncology    Return in about 2 months (around 07/15/2020), or if symptoms worsen or fail to improve, for Follow up with Dr. Valeta Harms, [place].   Lauraine Rinne, NP 05/15/2020   This appointment required 32 minutes of patient care (this includes precharting, chart review, review of results, face-to-face care, etc.).

## 2020-05-15 NOTE — Assessment & Plan Note (Signed)
Plan: Okay to continue Hycodan cough syrup as managed by oncology

## 2020-05-15 NOTE — Patient Instructions (Addendum)
You were seen today by Lauraine Rinne, NP  for:   1. History of pleural effusion  Clinically improved since hospital discharge  As reviewed today if you notice increase shortness of breath, difficulty breathing laying flat, increased cough, wheezing please notify our office and we will need to obtain a chest x-ray  2. 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 Lymphoma, large cell, intrathoracic lymph nodes (HCC)  Continue follow-up with Dr. Marin Olp  Continue medications as outlined by oncology  Follow Up:    Return in about 2 months (around 07/15/2020), or if symptoms worsen or fail to improve, for Follow up with Dr. Valeta Harms, [place]. Former Dr. Tamala Julian patient.  Will establish with Dr. Valeta Harms and 30-minute time slot.  Notification of test results are managed in the following manner: If there are  any recommendations or changes to the  plan of care discussed in office today,  we will contact you and let you know what they are. If you do not hear from [place], then your results are normal and you can view them through your  MyChart account , or a letter will be sent to you. Thank you again for trusting [place] with your care  - Thank you, [facility]    It is flu season:   >>> Best ways to protect herself from the flu: Receive the yearly flu vaccine, practice good hand hygiene washing with soap and also using hand sanitizer when available, eat a nutritious meals, get adequate rest, hydrate appropriately       Please contact the office if your symptoms worsen or you have concerns that you are not improving.   Thank you for choosing [facility] for your healthcare, and for allowing [place] to partner with you on your healthcare journey. I am thankful to be able to provide care to you today.   Wyn Quaker FNP-C

## 2020-05-15 NOTE — Progress Notes (Signed)
Oncology Nurse Navigator Documentation  Oncology Nurse Navigator Flowsheets 05/15/2020  Abnormal Finding Date -  Confirmed Diagnosis Date -  Diagnosis Status -  Planned Course of Treatment -  Phase of Treatment -  Chemotherapy Actual Start Date: -  Navigator Follow Up Date: 05/16/2020  Navigator Follow Up Reason: Follow-up Appointment  Navigator Location [facility]  Navigator Encounter Type Scan Review  Treatment Initiated Date -  Patient Visit Type MedOnc  Treatment Phase Active Tx  Barriers/Navigation Needs Coordination of Care;Education;Employed;Long Distance To Travel  Education -  Interventions None Required  Acuity Level 3-Moderate Needs (3-4 Barriers Identified)  Coordination of Care -  Education Method -  Support Groups/Services Friends and Family  Time Spent with Patient 15

## 2020-05-16 ENCOUNTER — Inpatient Hospital Stay: Payer: BC Managed Care – PPO

## 2020-05-16 ENCOUNTER — Inpatient Hospital Stay (HOSPITAL_BASED_OUTPATIENT_CLINIC_OR_DEPARTMENT_OTHER): Payer: BC Managed Care – PPO | Admitting: Hematology & Oncology

## 2020-05-16 ENCOUNTER — Inpatient Hospital Stay
Admission: RE | Admit: 2020-05-16 | Payer: BC Managed Care – PPO | Source: Ambulatory Visit | Admitting: Hematology & Oncology

## 2020-05-16 ENCOUNTER — Encounter: Payer: Self-pay | Admitting: Hematology & Oncology

## 2020-05-16 ENCOUNTER — Encounter: Payer: Self-pay | Admitting: *Deleted

## 2020-05-16 ENCOUNTER — Other Ambulatory Visit (HOSPITAL_BASED_OUTPATIENT_CLINIC_OR_DEPARTMENT_OTHER): Payer: Self-pay | Admitting: Oral Surgery

## 2020-05-16 ENCOUNTER — Inpatient Hospital Stay: Payer: BC Managed Care – PPO | Admitting: Hematology & Oncology

## 2020-05-16 ENCOUNTER — Other Ambulatory Visit: Payer: Self-pay

## 2020-05-16 VITALS — BP 110/80 | HR 102 | Temp 98.7°F | Resp 18 | Wt 188.0 lb

## 2020-05-16 DIAGNOSIS — C8582 Other specified types of non-Hodgkin lymphoma, intrathoracic lymph nodes: Secondary | ICD-10-CM

## 2020-05-16 DIAGNOSIS — C8332 Diffuse large B-cell lymphoma, intrathoracic lymph nodes: Secondary | ICD-10-CM

## 2020-05-16 DIAGNOSIS — Z95828 Presence of other vascular implants and grafts: Secondary | ICD-10-CM

## 2020-05-16 DIAGNOSIS — Z1159 Encounter for screening for other viral diseases: Secondary | ICD-10-CM

## 2020-05-16 DIAGNOSIS — C8520 Mediastinal (thymic) large B-cell lymphoma, unspecified site: Secondary | ICD-10-CM | POA: Diagnosis not present

## 2020-05-16 DIAGNOSIS — Z5111 Encounter for antineoplastic chemotherapy: Secondary | ICD-10-CM

## 2020-05-16 LAB — CMP (CANCER CENTER ONLY)
ALT: 52 U/L — ABNORMAL HIGH (ref 0–44)
AST: 25 U/L (ref 15–41)
Albumin: 3.7 g/dL (ref 3.5–5.0)
Alkaline Phosphatase: 148 U/L — ABNORMAL HIGH (ref 38–126)
Anion gap: 10 (ref 5–15)
BUN: 16 mg/dL (ref 6–20)
CO2: 27 mmol/L (ref 22–32)
Calcium: 9.4 mg/dL (ref 8.9–10.3)
Chloride: 99 mmol/L (ref 98–111)
Creatinine: 1.14 mg/dL (ref 0.61–1.24)
GFR, Estimated: >60 mL/min (ref >60)
Glucose, Bld: 179 mg/dL — ABNORMAL HIGH (ref 70–99)
Potassium: 4 mmol/L (ref 3.5–5.1)
Sodium: 136 mmol/L (ref 135–145)
Total Bilirubin: 0.3 mg/dL (ref 0.3–1.2)
Total Protein: 6.2 g/dL — ABNORMAL LOW (ref 6.5–8.1)

## 2020-05-16 LAB — CBC WITH DIFFERENTIAL (CANCER CENTER ONLY)
Abs Immature Granulocytes: 7.94 10*3/uL — ABNORMAL HIGH (ref 0.00–0.07)
Basophils Absolute: 0 10*3/uL (ref 0.0–0.1)
Basophils Relative: 0 %
Eosinophils Absolute: 0 10*3/uL (ref 0.0–0.5)
Eosinophils Relative: 0 %
HCT: 38 % — ABNORMAL LOW (ref 39.0–52.0)
Hemoglobin: 12.4 g/dL — ABNORMAL LOW (ref 13.0–17.0)
Immature Granulocytes: 32 %
Lymphocytes Relative: 5 %
Lymphs Abs: 1.2 10*3/uL (ref 0.7–4.0)
MCH: 28.1 pg (ref 26.0–34.0)
MCHC: 32.6 g/dL (ref 30.0–36.0)
MCV: 86 fL (ref 80.0–100.0)
Monocytes Absolute: 2.3 10*3/uL — ABNORMAL HIGH (ref 0.1–1.0)
Monocytes Relative: 9 %
Neutro Abs: 13.2 10*3/uL — ABNORMAL HIGH (ref 1.7–7.7)
Neutrophils Relative %: 54 %
Platelet Count: 209 10*3/uL (ref 150–400)
RBC: 4.42 MIL/uL (ref 4.22–5.81)
RDW: 14.4 % (ref 11.5–15.5)
WBC Count: 24.6 10*3/uL — ABNORMAL HIGH (ref 4.0–10.5)
nRBC: 0.2 % (ref 0.0–0.2)

## 2020-05-16 MED ORDER — HEPARIN SOD (PORK) LOCK FLUSH 100 UNIT/ML IV SOLN
500.0000 [IU] | Freq: Once | INTRAVENOUS | Status: AC
  Administered 2020-05-16: 500 [IU] via INTRAVENOUS
  Filled 2020-05-16: qty 5

## 2020-05-16 MED ORDER — SODIUM CHLORIDE 0.9% FLUSH
10.0000 mL | INTRAVENOUS | Status: DC | PRN
  Administered 2020-05-16: 10 mL via INTRAVENOUS
  Filled 2020-05-16: qty 10

## 2020-05-16 MED FILL — AMOXICILLIN 500 MG CAPSULE: 500 | 7 days supply | Qty: 28 | Fill #0

## 2020-05-16 NOTE — Progress Notes (Signed)
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  Patient in the office this afternoon for follow up prior to starting his next cycle. All original FMLA and Cancer policy paperwork given back to the patient.   Patient will be scheduled for planned admission for cycle 2 on 05/26/2020. He requires a covid screening test the Friday before. Originally scheduled for our [facility] location, but patient would prefer a testing location in [place] as this is where he lives. Able to schedule him for testing at [facility]. Will schedule his planned admit next week.  Patient calendar printed with covid screen instructions written down.   Oncology Nurse Navigator Documentation  Oncology Nurse Navigator Flowsheets 05/16/2020  Abnormal Finding Date -  Confirmed Diagnosis Date -  Diagnosis Status -  Planned Course of Treatment -  Phase of Treatment -  Chemotherapy Actual Start Date: -  Navigator Follow Up Date: 05/21/2020  Navigator Follow Up Reason: Appointment Review  Navigator Location [facility]  Navigator Encounter Type Follow-up Appt;Appt/Treatment Plan Review;MyChart  Treatment Initiated Date -  Patient Visit Type MedOnc  Treatment Phase Active Tx  Barriers/Navigation Needs Coordination of Care;Education;Employed;Long Distance To Travel  Education Other  Interventions Coordination of Care;Education;Psycho-Social Support  Acuity Level 3-Moderate Needs (3-4 Barriers Identified)  Coordination of Care Appts  Education Method Verbal;Written  Support Groups/Services Friends and Family  Time Spent with Patient 30

## 2020-05-16 NOTE — Progress Notes (Signed)
Hematology and Oncology Follow Up Visit  Travis Duran [id number] 09/20/1975 44 y.o. 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  This is his second office visit.  This is first office visit after he had his cycle of chemotherapy in the hospital.  We treated him with R-EPOCH.  He received his first dose on 05/02/2020.  We could not do a PET scan beforehand.  We did a PET scan on him on 05/15/2020.  Already, the mediastinal mass had decreased in size.  It is decreased by about 45%.  There was UV of only 7.3.  He had a pleural effusion.  He had reduced pleural tumor along the left parietal pleura.  The SUV was only 2.7.  He has some splenic activity which probably was from his Neulasta.  The SUV was 5.5.  Also noted was some activity in L5 along with his spine which I suspect also was from the colony-stimulating factor.  1 probably has that he had a broken tooth.  This was on the mandibular portion of his jaw.  The tooth was on the right side.  He is saw his oral Psychologist, sport and exercise.  He is going to have the tooth taken out on Monday.  His cough is better.  He is not coughing as much.  He has had no problems with nausea or vomiting.  He has had no diarrhea.  There has been no bleeding.  He has had no chest wall pain.  He has had no leg swelling.  Overall, he is tolerated treatment quite nicely.  I would have to say that currently, his performance status is probably ECOG 1.  Medications:  Current Outpatient Medications:  Marland Kitchen  Melatonin 3 MG CAPS, Take 3 mg by mouth at bedtime. Take two capsules, total of 6 mg daily at bedtime., Disp: , Rfl:  .  allopurinol (ZYLOPRIM) 100 MG tablet, Take 1 tablet (100 mg total) by mouth daily., Disp: 28 tablet, Rfl: 0 .  amoxicillin (AMOXIL) 500 MG capsule, Take 1,000 mg by mouth 2 (two) times daily. Prior to dental  procedures., Disp: , Rfl:  .  antiseptic oral rinse (BIOTENE) LIQD, 15 mLs by Mouth Rinse route every 4 (four) hours., Disp: 1000 mL, Rfl: 3 .  ciprofloxacin (CIPRO) 500 MG tablet, Take 1 tablet (500 mg total) by mouth daily with breakfast., Disp: 15 tablet, Rfl: 5 .  cyclobenzaprine (FLEXERIL) 10 MG tablet, Take 1 tablet (10 mg total) by mouth 3 (three) times daily as needed for muscle spasms., Disp: 45 tablet, Rfl: 2 .  famciclovir (FAMVIR) 500 MG tablet, Take 1 tablet (500 mg total) by mouth daily., Disp: 30 tablet, Rfl: 5 .  fluticasone (FLOVENT HFA) 110 MCG/ACT inhaler, Inhale 2 puffs into the lungs in the morning and at bedtime., Disp: 12 g, Rfl: 0 .  HYDROcodone-homatropine (HYCODAN) 5-1.5 MG/5ML syrup, Take 5 mLs by mouth every 6 (six) hours as needed for cough., Disp: 120 mL, Rfl: 0 .  ondansetron (ZOFRAN) 4 MG tablet, Take 1-2 tablets (4-8 mg total) by mouth every 8 (eight) hours as needed for nausea (not responsive to prochlorperazine (COMPAZINE))., Disp: 20 tablet, Rfl: 0 .  prochlorperazine (COMPAZINE) 10 MG tablet, Take 1 tablet (10 mg total) by mouth every 6 (six) hours as needed for nausea or vomiting., Disp: 30 tablet, Rfl: 0 .  tadalafil (CIALIS) 10 MG tablet, Take 1 tablet (10 mg total) by mouth daily as needed for erectile dysfunction., Disp: 10 tablet, Rfl: 11 No current facility-administered medications for this visit.  Facility-Administered Medications Ordered in Other Visits:  .  sodium chloride flush (NS) 0.9 % injection 10 mL, 10 mL, Intravenous, PRN, [place], Sarah M, NP, 10 mL at 05/16/20 1325  Allergies: No Known Allergies  Past Medical History, Surgical history, Social history, and Family History were reviewed and updated.  Review of Systems: Review of Systems  Constitutional: Positive for unexpected weight change.  HENT:   Positive for mouth sores.   Eyes: Negative.   Respiratory: Positive for cough.   Cardiovascular: Negative.   Gastrointestinal: Negative.    Endocrine: Negative.   Genitourinary: Negative.    Musculoskeletal: Negative.   Skin: Negative.   Neurological: Negative.   Hematological: Negative.   Psychiatric/Behavioral: Negative.     Physical Exam:  weight is 188 lb (85.3 kg). His oral temperature is 98.7 F (37.1 C). His blood pressure is 110/80 and his pulse is 102 (abnormal). His respiration is 18 and oxygen saturation is 96%.   Wt Readings from Last 3 Encounters:  05/16/20 188 lb (85.3 kg)  05/15/20 187 lb 6.4 oz (85 kg)  05/15/20 187 lb (84.8 kg)    Physical Exam Vitals reviewed.  HENT:     Head: Normocephalic and atraumatic.  Eyes:     Pupils: Pupils are equal, round, and reactive to light.  Cardiovascular:     Rate and Rhythm: Normal rate and regular rhythm.     Heart sounds: Normal heart sounds.  Pulmonary:     Effort: Pulmonary effort is normal.     Breath sounds: Normal breath sounds.     Comments: Pulmonary exam shows good breath sounds on the right side.  He has better breath sounds on the left side although still decreased.  I hear no wheezes, rales or rhonchi bilaterally. Abdominal:     General: Bowel sounds are normal.     Palpations: Abdomen is soft.  Musculoskeletal:        General: No tenderness or deformity. Normal range of motion.     Cervical back: Normal range of motion.  Lymphadenopathy:     Cervical: No cervical adenopathy.  Skin:    General: Skin is warm and dry.     Findings: No erythema or rash.  Neurological:     Mental Status: He is alert and oriented to person, place, and time.  Psychiatric:        Behavior: Behavior normal.        Thought Content: Thought content normal.        Judgment: Judgment normal.    Lab Results  Component Value Date   WBC 24.6 (H) 05/16/2020   HGB 12.4 (L) 05/16/2020   HCT 38.0 (L) 05/16/2020   MCV 86.0 05/16/2020   PLT 209 05/16/2020     Chemistry      Component Value Date/Time   NA 136 05/16/2020 1315   K 4.0 05/16/2020 1315   CL 99  05/16/2020 1315   CO2 27 05/16/2020 1315   BUN 16 05/16/2020 1315   CREATININE 1.14 05/16/2020 1315      Component Value Date/Time   CALCIUM 9.4 05/16/2020 1315   ALKPHOS 148 (H) 05/16/2020 1315   AST 25 05/16/2020 1315   ALT 52 (H) 05/16/2020 1315   BILITOT 0.3 05/16/2020 1315      Impression and Plan: Mr. Hunley is  a really nice 44 year old white male.  He presented with a massive mediastinal tumor.  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  This is a B-cell lymphoma.  He has for cycle of chemotherapy.  He had R-EPOCH.  He has tolerated this quite nicely.  So far his blood counts are not bad at all.  We also may need to increase the dose a little bit when we treat him with cycle #2.  I do not see any problem with him having the tooth extraction.  I talked to his oral surgeon.  I gave Mr. Kapur the lab work from today.  His platelet count is fine.  His white cell count is okay.  I am very impressed that his tumor has already shrunk by close to 50%.  This is after just 2 weeks.  I know we still have a long way to go.  I will plan for his next cycle of chemotherapy to start on October 25.  I would not plan for another PET scan until after the third cycle of treatment.  The question now remains as well or not we do not need to have any kind of consolidation radiation.  I suspect that we might have to given the sheer size of this tumor when he presented.  We will have to talk about this closer to the completion of his systemic chemotherapy.  I probably will get him into see [place] in November and try to make his appointments less frequent given that he has to drive a long way to see [place].   Volanda Napoleon, MD 10/15/20212:55 PM

## 2020-05-19 ENCOUNTER — Telehealth: Payer: Self-pay | Admitting: Hematology & Oncology

## 2020-05-19 LAB — LACTATE DEHYDROGENASE: LDH: 682 U/L — ABNORMAL HIGH (ref 98–192)

## 2020-05-19 MED FILL — ACETAMINOPHEN-COD #3 TABLET: 300-30 | 5 days supply | Qty: 20 | Fill #0

## 2020-05-19 NOTE — Telephone Encounter (Signed)
No los 10/15

## 2020-05-21 ENCOUNTER — Encounter: Payer: Self-pay | Admitting: *Deleted

## 2020-05-21 NOTE — Progress Notes (Signed)
Patient to be admitted for cycle two [facility] on 05/26/2020. Already scheduled for Covid screen. Bed placement and inpatient unit notified of admission. AD of 6E requests that patient call the unit the morning of admission to confirm open bed, and be on the unit no later than 8am.   Called and spoke to patient. Instructed him to call [phone] at 7am to confirm bed, and then be at the hospital no later than 8am. He confirmed.   Oncology Nurse Navigator Documentation  Oncology Nurse Navigator Flowsheets 05/21/2020  Abnormal Finding Date -  Confirmed Diagnosis Date -  Diagnosis Status -  Planned Course of Treatment -  Phase of Treatment -  Chemotherapy Actual Start Date: -  Navigator Follow Up Date: 05/26/2020  Navigator Follow Up Reason: Other:  Production assistant, radio Encounter Type Telephone;Appt/Treatment Plan Review  Telephone Outgoing Call  Treatment Initiated Date -  Patient Visit Type MedOnc  Treatment Phase Active Tx  Barriers/Navigation Needs Coordination of Care  Education Other  Interventions Coordination of Care;Education;Psycho-Social Support  Acuity Level 3-Moderate Needs (3-4 Barriers Identified)  Coordination of Care Appts  Education Method Verbal;Teach-back  Support Groups/Services Friends and Family  Time Spent with Patient 4

## 2020-05-23 ENCOUNTER — Other Ambulatory Visit: Payer: Self-pay

## 2020-05-23 ENCOUNTER — Other Ambulatory Visit
Admission: RE | Admit: 2020-05-23 | Discharge: 2020-05-23 | Disposition: A | Discharge status: Home / Self Care | Payer: BC Managed Care – PPO | Source: Ambulatory Visit | Attending: Hematology & Oncology | Admitting: Hematology & Oncology

## 2020-05-23 ENCOUNTER — Other Ambulatory Visit (HOSPITAL_COMMUNITY): Payer: BC Managed Care – PPO

## 2020-05-23 DIAGNOSIS — Z01812 Encounter for preprocedural laboratory examination: Secondary | ICD-10-CM | POA: Insufficient documentation

## 2020-05-23 DIAGNOSIS — Z20822 Contact with and (suspected) exposure to covid-19: Secondary | ICD-10-CM | POA: Insufficient documentation

## 2020-05-23 NOTE — Progress Notes (Signed)
Agree. Thanks for seeing him. Happy to establish care.  BLI

## 2020-05-24 LAB — SARS CORONAVIRUS 2 (TAT 6-24 HRS): SARS Coronavirus 2: NEGATIVE

## 2020-05-25 ENCOUNTER — Other Ambulatory Visit: Payer: Self-pay | Admitting: Hematology & Oncology

## 2020-05-25 DIAGNOSIS — C8582 Other specified types of non-Hodgkin lymphoma, intrathoracic lymph nodes: Secondary | ICD-10-CM

## 2020-05-26 ENCOUNTER — Encounter (HOSPITAL_COMMUNITY): Payer: Self-pay | Admitting: Hematology & Oncology

## 2020-05-26 ENCOUNTER — Encounter: Payer: Self-pay | Admitting: *Deleted

## 2020-05-26 ENCOUNTER — Inpatient Hospital Stay (HOSPITAL_COMMUNITY)
Admission: RE | Admit: 2020-05-26 | Discharge: 2020-05-30 | DRG: 847 | Disposition: A | Discharge status: Home / Self Care | Payer: BC Managed Care – PPO | Attending: Hematology & Oncology | Admitting: Hematology & Oncology

## 2020-05-26 ENCOUNTER — Inpatient Hospital Stay (HOSPITAL_COMMUNITY): Payer: BC Managed Care – PPO

## 2020-05-26 DIAGNOSIS — C8332 Diffuse large B-cell lymphoma, intrathoracic lymph nodes: Secondary | ICD-10-CM | POA: Diagnosis present

## 2020-05-26 DIAGNOSIS — C8582 Other specified types of non-Hodgkin lymphoma, intrathoracic lymph nodes: Secondary | ICD-10-CM

## 2020-05-26 DIAGNOSIS — D6481 Anemia due to antineoplastic chemotherapy: Secondary | ICD-10-CM | POA: Diagnosis present

## 2020-05-26 DIAGNOSIS — Z5111 Encounter for antineoplastic chemotherapy: Secondary | ICD-10-CM | POA: Diagnosis present

## 2020-05-26 DIAGNOSIS — D72829 Elevated white blood cell count, unspecified: Secondary | ICD-10-CM | POA: Diagnosis present

## 2020-05-26 DIAGNOSIS — T380X5A Adverse effect of glucocorticoids and synthetic analogues, initial encounter: Secondary | ICD-10-CM | POA: Diagnosis present

## 2020-05-26 DIAGNOSIS — D75838 Other thrombocytosis: Secondary | ICD-10-CM | POA: Diagnosis present

## 2020-05-26 DIAGNOSIS — C8520 Mediastinal (thymic) large B-cell lymphoma, unspecified site: Secondary | ICD-10-CM | POA: Diagnosis not present

## 2020-05-26 DIAGNOSIS — Z20822 Contact with and (suspected) exposure to covid-19: Secondary | ICD-10-CM | POA: Diagnosis present

## 2020-05-26 DIAGNOSIS — T451X5A Adverse effect of antineoplastic and immunosuppressive drugs, initial encounter: Secondary | ICD-10-CM | POA: Diagnosis present

## 2020-05-26 DIAGNOSIS — J90 Pleural effusion, not elsewhere classified: Secondary | ICD-10-CM | POA: Diagnosis present

## 2020-05-26 DIAGNOSIS — C8330 Diffuse large B-cell lymphoma, unspecified site: Secondary | ICD-10-CM | POA: Diagnosis present

## 2020-05-26 DIAGNOSIS — J9859 Other diseases of mediastinum, not elsewhere classified: Secondary | ICD-10-CM

## 2020-05-26 LAB — COMPREHENSIVE METABOLIC PANEL
ALT: 42 U/L (ref 0–44)
AST: 21 U/L (ref 15–41)
Albumin: 3.5 g/dL (ref 3.5–5.0)
Alkaline Phosphatase: 108 U/L (ref 38–126)
Anion gap: 9 (ref 5–15)
BUN: 22 mg/dL — ABNORMAL HIGH (ref 6–20)
CO2: 24 mmol/L (ref 22–32)
Calcium: 9 mg/dL (ref 8.9–10.3)
Chloride: 105 mmol/L (ref 98–111)
Creatinine, Ser: 0.95 mg/dL (ref 0.61–1.24)
GFR, Estimated: >60 mL/min (ref >60)
Glucose, Bld: 116 mg/dL — ABNORMAL HIGH (ref 70–99)
Potassium: 3.9 mmol/L (ref 3.5–5.1)
Sodium: 138 mmol/L (ref 135–145)
Total Bilirubin: 0.5 mg/dL (ref 0.3–1.2)
Total Protein: 6.3 g/dL — ABNORMAL LOW (ref 6.5–8.1)

## 2020-05-26 LAB — CBC
HCT: 35.4 % — ABNORMAL LOW (ref 39.0–52.0)
Hemoglobin: 11.4 g/dL — ABNORMAL LOW (ref 13.0–17.0)
MCH: 28.2 pg (ref 26.0–34.0)
MCHC: 32.2 g/dL (ref 30.0–36.0)
MCV: 87.6 fL (ref 80.0–100.0)
Platelets: 367 10*3/uL (ref 150–400)
RBC: 4.04 MIL/uL — ABNORMAL LOW (ref 4.22–5.81)
RDW: 15.6 % — ABNORMAL HIGH (ref 11.5–15.5)
WBC: 12.9 10*3/uL — ABNORMAL HIGH (ref 4.0–10.5)
nRBC: 0 % (ref 0.0–0.2)

## 2020-05-26 LAB — MAGNESIUM: Magnesium: 2.1 mg/dL (ref 1.7–2.4)

## 2020-05-26 LAB — PHOSPHORUS: Phosphorus: 3.8 mg/dL (ref 2.5–4.6)

## 2020-05-26 LAB — TSH: TSH: 1.394 u[IU]/mL (ref 0.350–4.500)

## 2020-05-26 MED ORDER — DIPHENHYDRAMINE HCL 25 MG PO CAPS: 25.0000 mg | ORAL_CAPSULE | Freq: Every evening | ORAL | Status: DC | PRN

## 2020-05-26 MED ORDER — SODIUM BICARBONATE/SODIUM CHLORIDE MOUTHWASH
OROMUCOSAL | Status: DC | PRN
  Filled 2020-05-26: qty 1000

## 2020-05-26 MED ORDER — CHLORHEXIDINE GLUCONATE CLOTH 2 % EX PADS
6.0000 | MEDICATED_PAD | Freq: Every day | CUTANEOUS | Status: DC
  Administered 2020-05-26 – 2020-05-30 (×5): 6 via TOPICAL

## 2020-05-26 MED ORDER — PREDNISONE 20 MG PO TABS
60.0000 mg | ORAL_TABLET | Freq: Every day | ORAL | Status: AC
  Administered 2020-05-26 – 2020-05-30 (×5): 60 mg via ORAL
  Filled 2020-05-26 (×6): qty 3

## 2020-05-26 MED ORDER — LIDOCAINE-PRILOCAINE 2.5-2.5 % EX CREA
TOPICAL_CREAM | Freq: Once | CUTANEOUS | Status: AC
  Administered 2020-05-26: 1 via TOPICAL
  Filled 2020-05-26: qty 5

## 2020-05-26 MED ORDER — ALLOPURINOL 100 MG PO TABS: 100.0000 mg | ORAL_TABLET | Freq: Every day | ORAL | Status: DC

## 2020-05-26 MED ORDER — SODIUM CHLORIDE 0.9 % IV SOLN
Freq: Once | INTRAVENOUS | Status: AC
  Administered 2020-05-26: 18 mg via INTRAVENOUS
  Filled 2020-05-26: qty 4

## 2020-05-26 MED ORDER — SODIUM CHLORIDE 0.9% FLUSH
10.0000 mL | Freq: Two times a day (BID) | INTRAVENOUS | Status: DC
  Administered 2020-05-26 – 2020-05-30 (×6): 10 mL

## 2020-05-26 MED ORDER — CIPROFLOXACIN HCL 500 MG PO TABS: 500.0000 mg | ORAL_TABLET | Freq: Every day | ORAL | Status: DC

## 2020-05-26 MED ORDER — ENOXAPARIN SODIUM 40 MG/0.4ML ~~LOC~~ SOLN
40.0000 mg | SUBCUTANEOUS | Status: DC
  Administered 2020-05-26 – 2020-05-27 (×2): 40 mg via SUBCUTANEOUS
  Filled 2020-05-26 (×4): qty 0.4

## 2020-05-26 MED ORDER — SODIUM CHLORIDE 0.9 % IV SOLN: INTRAVENOUS | Status: DC

## 2020-05-26 MED ORDER — FAMCICLOVIR 500 MG PO TABS
500.0000 mg | ORAL_TABLET | Freq: Every day | ORAL | Status: DC
  Administered 2020-05-27 – 2020-05-30 (×4): 500 mg via ORAL
  Filled 2020-05-26 (×4): qty 1

## 2020-05-26 MED ORDER — HYDROCODONE-HOMATROPINE 5-1.5 MG/5ML PO SYRP: 5.0000 mL | ORAL_SOLUTION | Freq: Four times a day (QID) | ORAL | Status: DC | PRN

## 2020-05-26 MED ORDER — SODIUM CHLORIDE 0.9% FLUSH: 10.0000 mL | INTRAVENOUS | Status: DC | PRN

## 2020-05-26 MED ORDER — SENNOSIDES-DOCUSATE SODIUM 8.6-50 MG PO TABS: 1.0000 | ORAL_TABLET | Freq: Every evening | ORAL | Status: DC | PRN

## 2020-05-26 MED ORDER — PROCHLORPERAZINE MALEATE 10 MG PO TABS: 10.0000 mg | ORAL_TABLET | Freq: Four times a day (QID) | ORAL | Status: DC | PRN

## 2020-05-26 MED ORDER — VINCRISTINE SULFATE CHEMO INJECTION 1 MG/ML
Freq: Once | INTRAVENOUS | Status: AC
  Filled 2020-05-26: qty 11

## 2020-05-26 NOTE — H&P (Addendum)
[place]  [phone] Fax:([phone]) [place]  Reason for Admission: Cycle #2 EPOCH-R for primary mediastinal B-cell lymphoma  HPI: [name] is a 44 year old male who was seen for initial consult during his hospitalization on 04/25/2020.  The patient had progressive shortness of breath over a 6-week period of time and a CT of the chest performed on 04/24/2020 showed a large malignant appearing left anterior mediastinal mass measuring 14.6 x 11.7 x 18.5 cm, large left pleural effusion, and small pericardial effusion.  He underwent several thoracenteses during his hospitalization as well as a CT-guided biopsy of the mediastinal mass.  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  Echocardiogram performed on 04/27/2020 showed a LVEF of 60 to 65%.  We were unable to perform a PET scan prior to his first dose of chemotherapy, but he had a PET scan performed on 05/15/2020 following cycle #1.  This showed a considerable reduction in size of the anterior mediastinal mass in the left pleural tumor deposits.  There was splenic activity and marrow activity likely due to G-CSF.  The patient presents to the hospital today for admission for cycle #2 of chemotherapy.  He is feeling well overall.  He had a dental extraction recently and did not have any significant bleeding following the procedure.  Cough and shortness of breath are significantly improved.  He has not had any fevers or chills.  Denies mucositis.  Denies chest pain.  Denies abdominal pain, nausea, vomiting, constipation, diarrhea.  No bleeding reported.  The patient is seen today for admission for cycle #2 of EPOCH-R.     Past Medical History:  Diagnosis Date  . Arthritis of right knee   . ED (erectile dysfunction)   . Lymphoma, large cell, intrathoracic  lymph nodes ([place]) 05/01/2020  . OSA (obstructive sleep apnea) 2007   with AHI 32/hr  :   Past Surgical History:  Procedure Laterality Date  . burn to left hand     skin graphs  . FINE NEEDLE ASPIRATION  04/25/2020   Procedure: FINE NEEDLE ASPIRATION (FNA) LINEAR;  Surgeon: Candee Furbish, MD;  Location: [facility] ENDOSCOPY;  Service: Pulmonary;;  . IR IMAGING GUIDED PORT INSERTION  05/07/2020  . IR THORACENTESIS ASP PLEURAL SPACE W/IMG GUIDE  04/25/2020  . IR THORACENTESIS ASP PLEURAL SPACE W/IMG GUIDE  05/07/2020  . REFRACTIVE SURGERY  09/2012   bilateral   . THORACENTESIS  04/25/2020   Procedure: THORACENTESIS;  Surgeon: Candee Furbish, MD;  Location: [facility] ENDOSCOPY;  Service: Pulmonary;;  . VIDEO BRONCHOSCOPY WITH ENDOBRONCHIAL ULTRASOUND N/A 04/25/2020   Procedure: VIDEO BRONCHOSCOPY WITH ENDOBRONCHIAL ULTRASOUND;  Surgeon: Candee Furbish, MD;  Location: [facility] ENDOSCOPY;  Service: Pulmonary;  Laterality: N/A;  :   No current facility-administered medications for this encounter.     No Known Allergies:   Family History  Problem Relation Age of Onset  . Sleep apnea Other   . Hypertension Other   . Emphysema Father   . Hypertension Father   :   Social History   Socioeconomic History  . Marital status: Married    Spouse name: Not on file  . Number of children: 0  . Years of education: Not on file  . Highest education level: Not on file  Occupational History  . Not on file  Tobacco Use  . Smoking status: Never Smoker  .  Smokeless tobacco: Never Used  Vaping Use  . Vaping Use: Never used  Substance and Sexual Activity  . Alcohol use: Yes    Alcohol/week: 0.0 standard drinks    Comment: occ  . Drug use: No  . Sexual activity: Yes    Partners: Female  Other Topics Concern  . Not on file  Social History Narrative  . Not on file   Social Determinants of Health   Financial Resource Strain:   . Difficulty of Paying Living Expenses: Not on file  Food Insecurity:   .  Worried About Charity fundraiser in the Last Year: Not on file  . Ran Out of Food in the Last Year: Not on file  Transportation Needs:   . Lack of Transportation (Medical): Not on file  . Lack of Transportation (Non-Medical): Not on file  Physical Activity:   . Days of Exercise per Week: Not on file  . Minutes of Exercise per Session: Not on file  Stress:   . Feeling of Stress : Not on file  Social Connections:   . Frequency of Communication with Friends and Family: Not on file  . Frequency of Social Gatherings with Friends and Family: Not on file  . Attends Religious Services: Not on file  . Active Member of Clubs or Organizations: Not on file  . Attends Archivist Meetings: Not on file  . Marital Status: Not on file  Intimate Partner Violence:   . Fear of Current or Ex-Partner: Not on file  . Emotionally Abused: Not on file  . Physically Abused: Not on file  . Sexually Abused: Not on file  :  Review of Systems: A comprehensive 14 point review of systems was negative except as noted in the HPI.  Exam: No data found.  General:  well-nourished in no acute distress.   Eyes:  no scleral icterus.   ENT:  There were no oropharyngeal lesions.   Neck was without thyromegaly.   Lymphatics:  Negative cervical, supraclavicular or axillary adenopathy.   Respiratory: Diminished on the left, otherwise clear today Cardiovascular:  Regular rate and rhythm, S1/S2, without murmur, rub or gallop.  There was no pedal edema.   GI:  abdomen was soft, flat, nontender, nondistended, without organomegaly.   Musculoskeletal: Strength symmetrical in the upper and lower extremities. Skin exam was without echymosis, petichae.   Neuro exam was nonfocal. Patient was alert and oriented.  Attention was good.   Language was appropriate.  Mood was normal without depression.  Speech was not pressured.  Thought content was not tangential.     Lab Results  Component Value Date   WBC 24.6 (H)  05/16/2020   HGB 12.4 (L) 05/16/2020   HCT 38.0 (L) 05/16/2020   PLT 209 05/16/2020   GLUCOSE 179 (H) 05/16/2020   CHOL 170 04/06/2019   TRIG 58.0 04/06/2019   HDL 43.20 04/06/2019   LDLCALC 115 (H) 04/06/2019   ALT 52 (H) 05/16/2020   AST 25 05/16/2020   NA 136 05/16/2020   K 4.0 05/16/2020   CL 99 05/16/2020   CREATININE 1.14 05/16/2020   BUN 16 05/16/2020   CO2 27 05/16/2020    DG Chest 1 View  Result Date: 05/07/2020 CLINICAL DATA:  Left thoracentesis.  Mediastinal lymph [place]. EXAM: CHEST  1 VIEW COMPARISON:  Chest x-ray dated May 05, 2020. FINDINGS: Unchanged right upper extremity PICC line. Stable cardiomediastinal silhouette with mediastinal widening corresponding to the known anterior mediastinal mass. Slightly decreased rightward  mediastinal shift. Decreased now small to moderate left pleural effusion with improved aeration of the left lung. The right lung is clear. No acute osseous abnormality. IMPRESSION: 1. Improved aeration of the left lung status post thoracentesis. Residual small to moderate left pleural effusion. No pneumothorax. Electronically Signed   By: Titus Dubin M.D.   On: 05/07/2020 13:17   DG Chest 1 View  Result Date: 05/05/2020 CLINICAL DATA:  Pleural effusion EXAM: CHEST  1 VIEW COMPARISON:  April 26, 2020 FINDINGS: Large pleural effusion on the left is present, increased in size from most recent study. There is likely consolidation and atelectasis superimposed on this large effusion. The right lung is clear. There is cardiomegaly with pulmonary vascularity grossly normal. Central catheter tip is in the superior vena cava. No pneumothorax. No adenopathy evident in areas where potential adenopathy can be assessed by radiography. No bone lesions. IMPRESSION: Large left pleural effusion with opacification of most of the left hemithorax. Suspect underlying atelectasis and/or consolidation. Right lung clear. Stable cardiomegaly. Central catheter tip in  superior vena cava. Electronically Signed   By: Lowella Grip III M.D.   On: 05/05/2020 11:22   CT ABDOMEN PELVIS W CONTRAST  Result Date: 04/26/2020 CLINICAL DATA:  Newly diagnosed non-small cell lung carcinoma. Staging. EXAM: CT ABDOMEN AND PELVIS WITH CONTRAST TECHNIQUE: Multidetector CT imaging of the abdomen and pelvis was performed using the standard protocol following bolus administration of intravenous contrast. CONTRAST:  155mL OMNIPAQUE IOHEXOL 300 MG/ML  SOLN COMPARISON:  Chest CT on 04/24/2020 FINDINGS: Lower Chest: Large anterior mediastinal mass and small bilateral pleural effusions are again seen, as better visualized on recent chest CT. Hepatobiliary: A 2 cm hypovascular lesion is seen in the posterior right hepatic lobe, which has nonspecific features. No other liver masses are identified. Gallbladder is unremarkable. No evidence of biliary ductal dilatation. Pancreas:  No mass or inflammatory changes. Spleen: Within normal limits in size and appearance. Adrenals/Urinary Tract: No masses identified. No evidence of ureteral calculi or hydronephrosis. Stomach/Bowel: No evidence of obstruction, inflammatory process or abnormal fluid collections. Vascular/Lymphatic: No pathologically enlarged lymph nodes. No abdominal aortic aneurysm. Reproductive:  No mass or other significant abnormality. Other:  None. Musculoskeletal:  No suspicious bone lesions identified. IMPRESSION: 2 cm nonspecific hypovascular lesion in posterior right hepatic lobe. Recommend abdomen MRI without and with contrast for further characterization. No other sites of metastatic disease identified within the abdomen or pelvis. Large anterior mediastinal mass and bilateral pleural effusions, as better demonstrated on recent chest CT. Electronically Signed   By: Marlaine Hind M.D.   On: 04/26/2020 14:15   NM PET Image Initial (PI) Skull Base To Thigh  Result Date: 05/15/2020 CLINICAL DATA:  Subsequent treatment strategy for  large B-cell lymphoma. The patient received chemotherapy and steroids last week. EXAM: NUCLEAR MEDICINE PET SKULL BASE TO THIGH TECHNIQUE: 10.5 mCi F-18 FDG was injected intravenously. Full-ring PET imaging was performed from the skull base to thigh after the radiotracer. CT data was obtained and used for attenuation correction and anatomic localization. Fasting blood glucose: 90 mg/dl COMPARISON:  Multiple exams, including CT chest 04/24/2020 and CT abdomen from 04/26/2020 FINDINGS: Mediastinal blood pool activity: SUV max 2.7 Liver activity: SUV max 4.1 NECK: 2.1 by 1.5 cm solid-appearing left thyroid nodule is not hypermetabolic. Incidental CT findings: none CHEST: Anterior mediastinal mass measures about 11.8 by 5.6 cm on image 92 of series 3, previously 14.4 by 10.2 cm on 04/24/2020, accordingly substantially reduced in volume. Maximum SUV 7.3 (Deauville  4), compatible with active malignancy. Moderate to large left pleural effusion. Reduced size of the pleural tumor along the left parietal pleura, with 1 posterolateral deposit measuring 0.9 cm in short axis on image 138 of series 3 (formerly 1.5 cm) with maximum SUV of 2.7, Deauville 2 a separate deposit adjacent to the descending thoracic aorta has a maximum SUV of 4.1, Deauville 3. Incidental CT findings: Right Port-A-Cath tip: Right atrium. Considerable passive atelectasis in the left lung. ABDOMEN/PELVIS: Diffuse accentuated splenic uptake of FDG, maximum SUV 5.5. No splenomegaly. Incidental CT findings: Mild abdominal aortic atherosclerotic calcification. SKELETON: Diffuse accentuated marrow activity potentially from granulocyte stimulation or diffuse marrow involvement by lymphoma. Index activity in the L5 vertebral body with maximum SUV 6.9. Incidental CT findings: none IMPRESSION: 1. Considerable reduction in size of the anterior mediastinal mass and of the left pleural tumor deposits. The anterior mediastinal mass is Deauville 4 and the pleural  deposits measure up to Deauville 3 in activity. 2. Diffusely accentuated splenic activity, maximum SUV 5.5 (Deauville 4). This could indicate lymphomatous involvement. 3. Diffusely accentuated marrow activity potentially from granulocyte stimulation or diffuse marrow involvement by lymphoma. 4. A 2.1 by 1.5 cm solid-appearing left thyroid nodule is not hypermetabolic. The lack of hypermetabolic activity indicates that this is highly likely to be benign, although current guidelines fail to take PET negativity into account. Thus based on current guidelines, follow up thyroid ultrasound would be recommended. Recommend thyroid US (ref: J Am Coll Radiol. 2015 Feb;12(2): 143-50). 5. Moderate left pleural effusion with passive atelectasis. 6.  Aortic Atherosclerosis (ICD10-I70.0). Electronically Signed   By: Van Clines M.D.   On: 05/15/2020 15:01   CT BIOPSY  Result Date: 04/28/2020 INDICATION: 44 year old male with large anterior mediastinal mass suspected lymphoma. Core biopsy requested for additional tissue for diagnostic purposes. EXAM: CT BIOPSY COMPARISON:  None. MEDICATIONS: None. ANESTHESIA/SEDATION: Fentanyl 2 mcg IV; Versed 50 mg IV Sedation time: 26 minutes; The patient was continuously monitored during the procedure by the interventional radiology nurse under my direct supervision. CONTRAST:  None. COMPLICATIONS: None immediate. PROCEDURE: Informed consent was obtained from the patient following an explanation of the procedure, risks, benefits and alternatives. A time out was performed prior to the initiation of the procedure. The patient was positioned supine on the CT table and a limited CT was performed for procedural planning demonstrating similar appearance of large anterior mediastinal soft tissue mass. The procedure was planned. The operative site was prepped and draped in the usual sterile fashion. Appropriate trajectory was confirmed with a 22 gauge spinal needle after the adjacent tissues  were anesthetized with 1% Lidocaine with epinephrine. Under intermittent CT guidance, a 17 gauge coaxial needle was advanced into the peripheral aspect of the mass. Appropriate positioning was confirmed and a total of 5 samples were obtained with an 18 gauge core needle biopsy device. The co-axial needle was removed while simultaneously injecting a Gel-Foam slurry and hemostasis was achieved with manual compression. A limited postprocedural CT was negative for hemorrhage or additional complication. A dressing was placed. The patient tolerated the procedure well without immediate postprocedural complication. IMPRESSION: Technically successful CT guided core needle biopsy of anterior mediastinal mass. Electronically Signed   By: Ruthann Cancer MD   On: 04/28/2020 16:38   DG Chest Port 1 View  Result Date: 04/26/2020 CLINICAL DATA:  Status post thoracentesis. EXAM: PORTABLE CHEST 1 VIEW COMPARISON:  Radiograph yesterday.  Chest CT 04/24/2020 FINDINGS: Decreased size of left pleural effusion after thoracentesis with increasing aeration  decreasing hazy opacity. There is persistent blunting of the medial and lateral costophrenic angles and loculated fluid tracking at the apex. No definite pneumothorax. Slight residual rightward tracheal deviation. Heart size and mediastinal contours are obscured, however well-defined density in the left hemithorax likely corresponds to anterior mediastinal mass on chest CT. The right lung is clear. IMPRESSION: 1. Decreased size of left pleural effusion after thoracentesis with increasing aeration in the left hemithorax and decreasing hazy opacity. No definite pneumothorax. 2. Small volume of persistent partially loculated left pleural fluid. Electronically Signed   By: Keith Rake M.D.   On: 04/26/2020 15:38   MR ABDOMEN MRCP W WO CONTAST  Result Date: 04/27/2020 CLINICAL DATA:  Non-small cell lung carcinoma. Indeterminate liver lesion on recent CT. Staging. EXAM: MRI ABDOMEN  WITHOUT AND WITH CONTRAST (INCLUDING MRCP) TECHNIQUE: Multiplanar multisequence MR imaging of the abdomen was performed both before and after the administration of intravenous contrast. Heavily T2-weighted images of the biliary and pancreatic ducts were obtained, and three-dimensional MRCP images were rendered by post processing. CONTRAST:  5mL GADAVIST GADOBUTROL 1 MMOL/ML IV SOLN COMPARISON:  CT on 04/26/2020 FINDINGS: Lower chest: Large anterior mediastinal mass and malignant left pleural effusion with numerous pleural based soft tissue masses, as better demonstrated on recent chest CT. Hepatobiliary: Image degradation by motion artifact noted. A solitary mildly complex cystic lesion is seen in the posterior right hepatic lobe which measures 1.6 cm. This shows a mildly lobulated contour and a few peripheral septations, but does not have a solid or enhancing component, and does not have typical features of a metastasis. No other liver lesions are identified. Gallbladder is unremarkable. No evidence of biliary ductal dilatation. Pancreas:  No mass or inflammatory changes. Spleen:  Within normal limits in size and appearance. Adrenals/Urinary Tract: No adrenal or renal masses identified. No evidence of hydronephrosis. Stomach/Bowel: Visualized portion unremarkable. Vascular/Lymphatic: No pathologically enlarged lymph nodes identified. No abdominal aortic aneurysm. Other:  None. Musculoskeletal:  No suspicious bone lesions identified. IMPRESSION: 1.6 cm complex cyst in the posterior right hepatic lobe. No evidence of hepatic or other abdominal metastatic disease. Large anterior mediastinal mass and malignant left pleural effusion , as better demonstrated on recent chest CT. Electronically Signed   By: Marlaine Hind M.D.   On: 04/27/2020 08:26   ECHOCARDIOGRAM COMPLETE  Result Date: 04/27/2020    ECHOCARDIOGRAM REPORT   Patient Name:   MUHAMMADALI RIES Date of Exam: 04/27/2020 Medical Rec #:  [id number]    Height:        69.0 in Accession #:    [id number]   Weight:       210.3 lb Date of Birth:  08-Aug-1975     BSA:          2.111 m Patient Age:    11 years     BP:           117/80 mmHg Patient Gender: M            HR:           105 bpm. Exam Location:  Inpatient Procedure: 2D Echo, Color Doppler, Cardiac Doppler and Strain Analysis Indications:    Pre-Chemo Evaluation  History:        Patient has no prior history of Echocardiogram examinations.                 Risk Factors:Sleep Apnea. Mediastinal mass.  Sonographer:    Raquel Sarna Senior RDCS Referring Phys: [place] Comments:  Technically challenging due to shadowing from mass and shifted heart position in chest. IMPRESSIONS  1. Left ventricular ejection fraction, by estimation, is 60 to 65%. The left ventricle has normal function. The left ventricle has no regional wall motion abnormalities. Left ventricular diastolic parameters are consistent with Grade I diastolic dysfunction (impaired relaxation). The average left ventricular global longitudinal strain is -17.3 %. The global longitudinal strain is normal.  2. Right ventricular systolic function is normal. The right ventricular size is normal.  3. The mitral valve is normal in structure. No evidence of mitral valve regurgitation. No evidence of mitral stenosis.  4. The aortic valve is tricuspid. Aortic valve regurgitation is not visualized. No aortic stenosis is present.  5. The inferior vena cava is dilated in size with <50% respiratory variability, suggesting right atrial pressure of 15 mmHg. FINDINGS  Left Ventricle: Left ventricular ejection fraction, by estimation, is 60 to 65%. The left ventricle has normal function. The left ventricle has no regional wall motion abnormalities. The average left ventricular global longitudinal strain is -17.3 %. The global longitudinal strain is normal. The left ventricular internal cavity size was normal in size. There is no left ventricular hypertrophy. Left ventricular  diastolic parameters are consistent with Grade I diastolic dysfunction (impaired relaxation). Right Ventricle: The right ventricular size is normal. No increase in right ventricular wall thickness. Right ventricular systolic function is normal. Left Atrium: Left atrial size was normal in size. Right Atrium: Right atrial size was normal in size. Pericardium: There is no evidence of pericardial effusion. Mitral Valve: The mitral valve is normal in structure. No evidence of mitral valve regurgitation. No evidence of mitral valve stenosis. Tricuspid Valve: The tricuspid valve is normal in structure. Tricuspid valve regurgitation is not demonstrated. No evidence of tricuspid stenosis. Aortic Valve: The aortic valve is tricuspid. Aortic valve regurgitation is not visualized. No aortic stenosis is present. Pulmonic Valve: The pulmonic valve was normal in structure. Pulmonic valve regurgitation is not visualized. No evidence of pulmonic stenosis. Aorta: The aortic root is normal in size and structure. Venous: The inferior vena cava is dilated in size with less than 50% respiratory variability, suggesting right atrial pressure of 15 mmHg. IAS/Shunts: No atrial level shunt detected by color flow Doppler. Additional Comments: Mass noted anterior to the right ventricle and apex.  LEFT VENTRICLE PLAX 2D LVOT diam:     2.20 cm LV SV:         65       2D Longitudinal Strain LV SV Index:   31       2D Strain GLS (A2C):   -15.8 % LVOT Area:     3.80 cm 2D Strain GLS (A3C):   -18.4 %                         2D Strain GLS (A4C):   -17.8 %                         2D Strain GLS Avg:     -17.3 % RIGHT VENTRICLE RV S prime:     10.00 cm/s TAPSE (M-mode): 1.5 cm LEFT ATRIUM             Index       RIGHT ATRIUM           Index LA Vol (A2C):   25.9 ml 12.27 ml/m RA Area:     13.40 cm LA Vol (  A4C):   17.6 ml 8.34 ml/m  RA Volume:   32.00 ml  15.16 ml/m LA Biplane Vol: 21.9 ml 10.38 ml/m  AORTIC VALVE LVOT Vmax:   97.30 cm/s LVOT Vmean:   74.600 cm/s LVOT VTI:    0.170 m  AORTA Ao Root diam: 3.30 cm Ao Asc diam:  3.30 cm  SHUNTS Systemic VTI:  0.17 m Systemic Diam: 2.20 cm Skeet Latch MD Electronically signed by Skeet Latch MD Signature Date/Time: 04/27/2020/12:40:00 PM    Final    IR IMAGING GUIDED PORT INSERTION  Result Date: 05/07/2020 CLINICAL DATA:  Lymphoma, needs durable venous access for planned treatment regimen. EXAM: TUNNELED PORT CATHETER PLACEMENT WITH ULTRASOUND AND FLUOROSCOPIC GUIDANCE FLUOROSCOPY TIME:  6 seconds; 2 mGy ANESTHESIA/SEDATION: Intravenous Fentanyl 139mcg and Versed 4mg  were administered as conscious sedation during continuous monitoring of the patient's level of consciousness and physiological / cardiorespiratory status by the radiology RN, with a total moderate sedation time of 17 minutes. TECHNIQUE: The procedure, risks, benefits, and alternatives were explained to the patient. Questions regarding the procedure were encouraged and answered. The patient understands and consents to the procedure. As antibiotic prophylaxis, cefazolin 2 g was ordered pre-procedure and administered intravenously within one hour of incision. Patency of the right IJ vein was confirmed with ultrasound with image documentation. An appropriate skin site was determined. Skin site was marked. Region was prepped using maximum barrier technique including cap and mask, sterile gown, sterile gloves, large sterile sheet, and Chlorhexidine as cutaneous antisepsis. The region was infiltrated locally with 1% lidocaine. Under real-time ultrasound guidance, the right IJ vein was accessed with a 21 gauge micropuncture needle; the needle tip within the vein was confirmed with ultrasound image documentation. Needle was exchanged over a 018 guidewire for transitional dilator, and vascular measurement was performed. A small incision was made on the right anterior chest wall and a subcutaneous pocket fashioned. The power-injectable port was  positioned and its catheter tunneled to the right IJ dermatotomy site. The transitional dilator was exchanged over an Amplatz wire for a peel-away sheath, through which the port catheter, which had been trimmed to the appropriate length, was advanced and positioned under fluoroscopy with its tip at the cavoatrial junction. Spot chest radiograph confirms good catheter position and no pneumothorax. The port was flushed per protocol. The pocket was closed with deep interrupted and subcuticular continuous 3-0 Monocryl sutures. The incisions were covered with Dermabond then covered with a sterile dressing. The patient tolerated the procedure well. COMPLICATIONS: COMPLICATIONS None immediate IMPRESSION: Technically successful right IJ power-injectable port catheter placement. Ready for routine use. Electronically Signed   By: Lucrezia Europe M.D.   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 For this, he requires durable venous access and presents for PICC placement. EXAM: PICC LINE PLACEMENT WITH ULTRASOUND AND FLUOROSCOPIC GUIDANCE MEDICATIONS: None. ANESTHESIA/SEDATION: None. FLUOROSCOPY TIME:  Fluoroscopy Time: 0 minutes 24 seconds (7 mGy). COMPLICATIONS: None immediate. PROCEDURE: The patient was advised of the possible risks and complications and agreed to undergo the procedure. The patient was then brought to the angiographic suite for the procedure. The right/left arm was prepped with chlorhexidine, draped in the usual sterile fashion using maximum barrier technique (cap and mask, sterile gown, sterile gloves, large sterile sheet, hand hygiene and cutaneous antisepsis) and infiltrated locally with 1% Lidocaine. Ultrasound demonstrated patency of the right basilic vein,  and this was documented with an image. Under real-time ultrasound guidance, this vein was accessed with  a 21 gauge micropuncture needle and image documentation was performed. A 0.018 wire was introduced in to the vein. Over this, a 6 [place] dual lumen power injectable PICC was advanced to the lower SVC/right atrial junction. Fluoroscopy during the procedure and fluoro spot radiograph confirms appropriate catheter position. The catheter was flushed and covered with sterile dressing. Catheter length: 42 cm IMPRESSION: Successful right arm power PICC line placement with ultrasound and fluoroscopic guidance. The catheter is ready for use. Electronically Signed   By: Jacqulynn Cadet M.D.   On: 05/02/2020 08:52   IR THORACENTESIS ASP PLEURAL SPACE W/IMG GUIDE  Result Date: 05/07/2020 INDICATION: Patient with a history of lymphoma and recurrent left pleural effusion. Interventional radiology asked to perform a therapeutic thoracentesis. EXAM: ULTRASOUND GUIDED THORACENTESIS MEDICATIONS: 1% lidocaine 5 mL COMPLICATIONS: None immediate.  No pneumothorax on follow-up radiograph. PROCEDURE: An ultrasound guided thoracentesis was thoroughly discussed with the patient and questions answered. The benefits, risks, alternatives and complications were also discussed. The patient understands and wishes to proceed with the procedure. Written consent was obtained. Ultrasound was performed to localize and mark an adequate pocket of fluid in the left chest. The area was then prepped and draped in the normal sterile fashion. 1% Lidocaine was used for local anesthesia. Under ultrasound guidance a 6 Fr Safe-T-Centesis catheter was introduced. Thoracentesis was performed. The catheter was removed and a dressing applied. FINDINGS: A total of approximately 1.6 L of dark yellow fluid was removed. IMPRESSION: Successful ultrasound guided left thoracentesis yielding 1.6 L of pleural fluid. Read by: Soyla Dryer, NP Electronically Signed   By: Lucrezia Europe M.D.   On: 05/07/2020 13:18     DG Chest 1 View  Result Date: 05/07/2020 CLINICAL  DATA:  Left thoracentesis.  Mediastinal lymph [place]. EXAM: CHEST  1 VIEW COMPARISON:  Chest x-ray dated May 05, 2020. FINDINGS: Unchanged right upper extremity PICC line. Stable cardiomediastinal silhouette with mediastinal widening corresponding to the known anterior mediastinal mass. Slightly decreased rightward mediastinal shift. Decreased now small to moderate left pleural effusion with improved aeration of the left lung. The right lung is clear. No acute osseous abnormality. IMPRESSION: 1. Improved aeration of the left lung status post thoracentesis. Residual small to moderate left pleural effusion. No pneumothorax. Electronically Signed   By: Titus Dubin M.D.   On: 05/07/2020 13:17   DG Chest 1 View  Result Date: 05/05/2020 CLINICAL DATA:  Pleural effusion EXAM: CHEST  1 VIEW COMPARISON:  April 26, 2020 FINDINGS: Large pleural effusion on the left is present, increased in size from most recent study. There is likely consolidation and atelectasis superimposed on this large effusion. The right lung is clear. There is cardiomegaly with pulmonary vascularity grossly normal. Central catheter tip is in the superior vena cava. No pneumothorax. No adenopathy evident in areas where potential adenopathy can be assessed by radiography. No bone lesions. IMPRESSION: Large left pleural effusion with opacification of most of the left hemithorax. Suspect underlying atelectasis and/or consolidation. Right lung clear. Stable cardiomegaly. Central catheter tip in superior vena cava. Electronically Signed   By: Lowella Grip III M.D.   On: 05/05/2020 11:22   CT ABDOMEN PELVIS W CONTRAST  Result Date: 04/26/2020 CLINICAL DATA:  Newly diagnosed non-small cell lung carcinoma. Staging. EXAM: CT ABDOMEN AND PELVIS WITH CONTRAST TECHNIQUE: Multidetector CT imaging of the abdomen and pelvis was performed using the standard protocol following bolus  administration of intravenous contrast. CONTRAST:  113mL OMNIPAQUE  IOHEXOL 300 MG/ML  SOLN COMPARISON:  Chest CT on 04/24/2020 FINDINGS: Lower Chest: Large anterior mediastinal mass and small bilateral pleural effusions are again seen, as better visualized on recent chest CT. Hepatobiliary: A 2 cm hypovascular lesion is seen in the posterior right hepatic lobe, which has nonspecific features. No other liver masses are identified. Gallbladder is unremarkable. No evidence of biliary ductal dilatation. Pancreas:  No mass or inflammatory changes. Spleen: Within normal limits in size and appearance. Adrenals/Urinary Tract: No masses identified. No evidence of ureteral calculi or hydronephrosis. Stomach/Bowel: No evidence of obstruction, inflammatory process or abnormal fluid collections. Vascular/Lymphatic: No pathologically enlarged lymph nodes. No abdominal aortic aneurysm. Reproductive:  No mass or other significant abnormality. Other:  None. Musculoskeletal:  No suspicious bone lesions identified. IMPRESSION: 2 cm nonspecific hypovascular lesion in posterior right hepatic lobe. Recommend abdomen MRI without and with contrast for further characterization. No other sites of metastatic disease identified within the abdomen or pelvis. Large anterior mediastinal mass and bilateral pleural effusions, as better demonstrated on recent chest CT. Electronically Signed   By: Marlaine Hind M.D.   On: 04/26/2020 14:15   NM PET Image Initial (PI) Skull Base To Thigh  Result Date: 05/15/2020 CLINICAL DATA:  Subsequent treatment strategy for large B-cell lymphoma. The patient received chemotherapy and steroids last week. EXAM: NUCLEAR MEDICINE PET SKULL BASE TO THIGH TECHNIQUE: 10.5 mCi F-18 FDG was injected intravenously. Full-ring PET imaging was performed from the skull base to thigh after the radiotracer. CT data was obtained and used for attenuation correction and anatomic localization. Fasting blood glucose: 90 mg/dl COMPARISON:  Multiple exams, including CT chest 04/24/2020 and CT  abdomen from 04/26/2020 FINDINGS: Mediastinal blood pool activity: SUV max 2.7 Liver activity: SUV max 4.1 NECK: 2.1 by 1.5 cm solid-appearing left thyroid nodule is not hypermetabolic. Incidental CT findings: none CHEST: Anterior mediastinal mass measures about 11.8 by 5.6 cm on image 92 of series 3, previously 14.4 by 10.2 cm on 04/24/2020, accordingly substantially reduced in volume. Maximum SUV 7.3 (Deauville 4), compatible with active malignancy. Moderate to large left pleural effusion. Reduced size of the pleural tumor along the left parietal pleura, with 1 posterolateral deposit measuring 0.9 cm in short axis on image 138 of series 3 (formerly 1.5 cm) with maximum SUV of 2.7, Deauville 2 a separate deposit adjacent to the descending thoracic aorta has a maximum SUV of 4.1, Deauville 3. Incidental CT findings: Right Port-A-Cath tip: Right atrium. Considerable passive atelectasis in the left lung. ABDOMEN/PELVIS: Diffuse accentuated splenic uptake of FDG, maximum SUV 5.5. No splenomegaly. Incidental CT findings: Mild abdominal aortic atherosclerotic calcification. SKELETON: Diffuse accentuated marrow activity potentially from granulocyte stimulation or diffuse marrow involvement by lymphoma. Index activity in the L5 vertebral body with maximum SUV 6.9. Incidental CT findings: none IMPRESSION: 1. Considerable reduction in size of the anterior mediastinal mass and of the left pleural tumor deposits. The anterior mediastinal mass is Deauville 4 and the pleural deposits measure up to Deauville 3 in activity. 2. Diffusely accentuated splenic activity, maximum SUV 5.5 (Deauville 4). This could indicate lymphomatous involvement. 3. Diffusely accentuated marrow activity potentially from granulocyte stimulation or diffuse marrow involvement by lymphoma. 4. A 2.1 by 1.5 cm solid-appearing left thyroid nodule is not hypermetabolic. The lack of hypermetabolic activity indicates that this is highly likely to be benign,  although current guidelines fail to take PET negativity into account. Thus based on current guidelines, follow up thyroid  ultrasound would be recommended. Recommend thyroid US (ref: J Am Coll Radiol. 2015 Feb;12(2): 143-50). 5. Moderate left pleural effusion with passive atelectasis. 6.  Aortic Atherosclerosis (ICD10-I70.0). Electronically Signed   By: Van Clines M.D.   On: 05/15/2020 15:01   CT BIOPSY  Result Date: 04/28/2020 INDICATION: 44 year old male with large anterior mediastinal mass suspected lymphoma. Core biopsy requested for additional tissue for diagnostic purposes. EXAM: CT BIOPSY COMPARISON:  None. MEDICATIONS: None. ANESTHESIA/SEDATION: Fentanyl 2 mcg IV; Versed 50 mg IV Sedation time: 26 minutes; The patient was continuously monitored during the procedure by the interventional radiology nurse under my direct supervision. CONTRAST:  None. COMPLICATIONS: None immediate. PROCEDURE: Informed consent was obtained from the patient following an explanation of the procedure, risks, benefits and alternatives. A time out was performed prior to the initiation of the procedure. The patient was positioned supine on the CT table and a limited CT was performed for procedural planning demonstrating similar appearance of large anterior mediastinal soft tissue mass. The procedure was planned. The operative site was prepped and draped in the usual sterile fashion. Appropriate trajectory was confirmed with a 22 gauge spinal needle after the adjacent tissues were anesthetized with 1% Lidocaine with epinephrine. Under intermittent CT guidance, a 17 gauge coaxial needle was advanced into the peripheral aspect of the mass. Appropriate positioning was confirmed and a total of 5 samples were obtained with an 18 gauge core needle biopsy device. The co-axial needle was removed while simultaneously injecting a Gel-Foam slurry and hemostasis was achieved with manual compression. A limited postprocedural CT was  negative for hemorrhage or additional complication. A dressing was placed. The patient tolerated the procedure well without immediate postprocedural complication. IMPRESSION: Technically successful CT guided core needle biopsy of anterior mediastinal mass. Electronically Signed   By: Ruthann Cancer MD   On: 04/28/2020 16:38   DG Chest Port 1 View  Result Date: 04/26/2020 CLINICAL DATA:  Status post thoracentesis. EXAM: PORTABLE CHEST 1 VIEW COMPARISON:  Radiograph yesterday.  Chest CT 04/24/2020 FINDINGS: Decreased size of left pleural effusion after thoracentesis with increasing aeration decreasing hazy opacity. There is persistent blunting of the medial and lateral costophrenic angles and loculated fluid tracking at the apex. No definite pneumothorax. Slight residual rightward tracheal deviation. Heart size and mediastinal contours are obscured, however well-defined density in the left hemithorax likely corresponds to anterior mediastinal mass on chest CT. The right lung is clear. IMPRESSION: 1. Decreased size of left pleural effusion after thoracentesis with increasing aeration in the left hemithorax and decreasing hazy opacity. No definite pneumothorax. 2. Small volume of persistent partially loculated left pleural fluid. Electronically Signed   By: Keith Rake M.D.   On: 04/26/2020 15:38   MR ABDOMEN MRCP W WO CONTAST  Result Date: 04/27/2020 CLINICAL DATA:  Non-small cell lung carcinoma. Indeterminate liver lesion on recent CT. Staging. EXAM: MRI ABDOMEN WITHOUT AND WITH CONTRAST (INCLUDING MRCP) TECHNIQUE: Multiplanar multisequence MR imaging of the abdomen was performed both before and after the administration of intravenous contrast. Heavily T2-weighted images of the biliary and pancreatic ducts were obtained, and three-dimensional MRCP images were rendered by post processing. CONTRAST:  71mL GADAVIST GADOBUTROL 1 MMOL/ML IV SOLN COMPARISON:  CT on 04/26/2020 FINDINGS: Lower chest: Large  anterior mediastinal mass and malignant left pleural effusion with numerous pleural based soft tissue masses, as better demonstrated on recent chest CT. Hepatobiliary: Image degradation by motion artifact noted. A solitary mildly complex cystic lesion is seen in the posterior right hepatic lobe which  measures 1.6 cm. This shows a mildly lobulated contour and a few peripheral septations, but does not have a solid or enhancing component, and does not have typical features of a metastasis. No other liver lesions are identified. Gallbladder is unremarkable. No evidence of biliary ductal dilatation. Pancreas:  No mass or inflammatory changes. Spleen:  Within normal limits in size and appearance. Adrenals/Urinary Tract: No adrenal or renal masses identified. No evidence of hydronephrosis. Stomach/Bowel: Visualized portion unremarkable. Vascular/Lymphatic: No pathologically enlarged lymph nodes identified. No abdominal aortic aneurysm. Other:  None. Musculoskeletal:  No suspicious bone lesions identified. IMPRESSION: 1.6 cm complex cyst in the posterior right hepatic lobe. No evidence of hepatic or other abdominal metastatic disease. Large anterior mediastinal mass and malignant left pleural effusion , as better demonstrated on recent chest CT. Electronically Signed   By: Marlaine Hind M.D.   On: 04/27/2020 08:26   ECHOCARDIOGRAM COMPLETE  Result Date: 04/27/2020    ECHOCARDIOGRAM REPORT   Patient Name:   Travis Duran Date of Exam: 04/27/2020 Medical Rec #:  [id number]    Height:       69.0 in Accession #:    [id number]   Weight:       210.3 lb Date of Birth:  Dec 19, 1975     BSA:          2.111 m Patient Age:    25 years     BP:           117/80 mmHg Patient Gender: M            HR:           105 bpm. Exam Location:  Inpatient Procedure: 2D Echo, Color Doppler, Cardiac Doppler and Strain Analysis Indications:    Pre-Chemo Evaluation  History:        Patient has no prior history of Echocardiogram examinations.                  Risk Factors:Sleep Apnea. Mediastinal mass.  Sonographer:    Raquel Sarna Senior RDCS Referring Phys: [place] Comments: Technically challenging due to shadowing from mass and shifted heart position in chest. IMPRESSIONS  1. Left ventricular ejection fraction, by estimation, is 60 to 65%. The left ventricle has normal function. The left ventricle has no regional wall motion abnormalities. Left ventricular diastolic parameters are consistent with Grade I diastolic dysfunction (impaired relaxation). The average left ventricular global longitudinal strain is -17.3 %. The global longitudinal strain is normal.  2. Right ventricular systolic function is normal. The right ventricular size is normal.  3. The mitral valve is normal in structure. No evidence of mitral valve regurgitation. No evidence of mitral stenosis.  4. The aortic valve is tricuspid. Aortic valve regurgitation is not visualized. No aortic stenosis is present.  5. The inferior vena cava is dilated in size with <50% respiratory variability, suggesting right atrial pressure of 15 mmHg. FINDINGS  Left Ventricle: Left ventricular ejection fraction, by estimation, is 60 to 65%. The left ventricle has normal function. The left ventricle has no regional wall motion abnormalities. The average left ventricular global longitudinal strain is -17.3 %. The global longitudinal strain is normal. The left ventricular internal cavity size was normal in size. There is no left ventricular hypertrophy. Left ventricular diastolic parameters are consistent with Grade I diastolic dysfunction (impaired relaxation). Right Ventricle: The right ventricular size is normal. No increase in right ventricular wall thickness. Right ventricular systolic function is normal. Left Atrium: Left atrial  size was normal in size. Right Atrium: Right atrial size was normal in size. Pericardium: There is no evidence of pericardial effusion. Mitral Valve: The mitral valve is  normal in structure. No evidence of mitral valve regurgitation. No evidence of mitral valve stenosis. Tricuspid Valve: The tricuspid valve is normal in structure. Tricuspid valve regurgitation is not demonstrated. No evidence of tricuspid stenosis. Aortic Valve: The aortic valve is tricuspid. Aortic valve regurgitation is not visualized. No aortic stenosis is present. Pulmonic Valve: The pulmonic valve was normal in structure. Pulmonic valve regurgitation is not visualized. No evidence of pulmonic stenosis. Aorta: The aortic root is normal in size and structure. Venous: The inferior vena cava is dilated in size with less than 50% respiratory variability, suggesting right atrial pressure of 15 mmHg. IAS/Shunts: No atrial level shunt detected by color flow Doppler. Additional Comments: Mass noted anterior to the right ventricle and apex.  LEFT VENTRICLE PLAX 2D LVOT diam:     2.20 cm LV SV:         65       2D Longitudinal Strain LV SV Index:   31       2D Strain GLS (A2C):   -15.8 % LVOT Area:     3.80 cm 2D Strain GLS (A3C):   -18.4 %                         2D Strain GLS (A4C):   -17.8 %                         2D Strain GLS Avg:     -17.3 % RIGHT VENTRICLE RV S prime:     10.00 cm/s TAPSE (M-mode): 1.5 cm LEFT ATRIUM             Index       RIGHT ATRIUM           Index LA Vol (A2C):   25.9 ml 12.27 ml/m RA Area:     13.40 cm LA Vol (A4C):   17.6 ml 8.34 ml/m  RA Volume:   32.00 ml  15.16 ml/m LA Biplane Vol: 21.9 ml 10.38 ml/m  AORTIC VALVE LVOT Vmax:   97.30 cm/s LVOT Vmean:  74.600 cm/s LVOT VTI:    0.170 m  AORTA Ao Root diam: 3.30 cm Ao Asc diam:  3.30 cm  SHUNTS Systemic VTI:  0.17 m Systemic Diam: 2.20 cm Skeet Latch MD Electronically signed by Skeet Latch MD Signature Date/Time: 04/27/2020/12:40:00 PM    Final    IR IMAGING GUIDED PORT INSERTION  Result Date: 05/07/2020 CLINICAL DATA:  Lymphoma, needs durable venous access for planned treatment regimen. EXAM: TUNNELED PORT CATHETER  PLACEMENT WITH ULTRASOUND AND FLUOROSCOPIC GUIDANCE FLUOROSCOPY TIME:  6 seconds; 2 mGy ANESTHESIA/SEDATION: Intravenous Fentanyl 190mcg and Versed 4mg  were administered as conscious sedation during continuous monitoring of the patient's level of consciousness and physiological / cardiorespiratory status by the radiology RN, with a total moderate sedation time of 17 minutes. TECHNIQUE: The procedure, risks, benefits, and alternatives were explained to the patient. Questions regarding the procedure were encouraged and answered. The patient understands and consents to the procedure. As antibiotic prophylaxis, cefazolin 2 g was ordered pre-procedure and administered intravenously within one hour of incision. Patency of the right IJ vein was confirmed with ultrasound with image documentation. An appropriate skin site was determined. Skin site was marked. Region was prepped using maximum barrier technique  including cap and mask, sterile gown, sterile gloves, large sterile sheet, and Chlorhexidine as cutaneous antisepsis. The region was infiltrated locally with 1% lidocaine. Under real-time ultrasound guidance, the right IJ vein was accessed with a 21 gauge micropuncture needle; the needle tip within the vein was confirmed with ultrasound image documentation. Needle was exchanged over a 018 guidewire for transitional dilator, and vascular measurement was performed. A small incision was made on the right anterior chest wall and a subcutaneous pocket fashioned. The power-injectable port was positioned and its catheter tunneled to the right IJ dermatotomy site. The transitional dilator was exchanged over an Amplatz wire for a peel-away sheath, through which the port catheter, which had been trimmed to the appropriate length, was advanced and positioned under fluoroscopy with its tip at the cavoatrial junction. Spot chest radiograph confirms good catheter position and no pneumothorax. The port was flushed per protocol. The  pocket was closed with deep interrupted and subcuticular continuous 3-0 Monocryl sutures. The incisions were covered with Dermabond then covered with a sterile dressing. The patient tolerated the procedure well. COMPLICATIONS: COMPLICATIONS None immediate IMPRESSION: Technically successful right IJ power-injectable port catheter placement. Ready for routine use. Electronically Signed   By: Lucrezia Europe M.D.   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 For this, he requires durable venous access and presents for PICC placement. EXAM: PICC LINE PLACEMENT WITH ULTRASOUND AND FLUOROSCOPIC GUIDANCE MEDICATIONS: None. ANESTHESIA/SEDATION: None. FLUOROSCOPY TIME:  Fluoroscopy Time: 0 minutes 24 seconds (7 mGy). COMPLICATIONS: None immediate. PROCEDURE: The patient was advised of the possible risks and complications and agreed to undergo the procedure. The patient was then brought to the angiographic suite for the procedure. The right/left arm was prepped with chlorhexidine, draped in the usual sterile fashion using maximum barrier technique (cap and mask, sterile gown, sterile gloves, large sterile sheet, hand hygiene and cutaneous antisepsis) and infiltrated locally with 1% Lidocaine. Ultrasound demonstrated patency of the right basilic vein, and this was documented with an image. Under real-time ultrasound guidance, this vein was accessed with a 21 gauge micropuncture needle and image documentation was performed. A 0.018 wire was introduced in to the vein. Over this, a 6 [place] dual lumen power injectable PICC was advanced to the lower SVC/right atrial junction. Fluoroscopy during the procedure and fluoro spot radiograph confirms appropriate catheter position. The catheter was flushed and covered with sterile dressing. Catheter length: 42 cm IMPRESSION: Successful right arm  power PICC line placement with ultrasound and fluoroscopic guidance. The catheter is ready for use. Electronically Signed   By: Jacqulynn Cadet M.D.   On: 05/02/2020 08:52   IR THORACENTESIS ASP PLEURAL SPACE W/IMG GUIDE  Result Date: 05/07/2020 INDICATION: Patient with a history of lymphoma and recurrent left pleural effusion. Interventional radiology asked to perform a therapeutic thoracentesis. EXAM: ULTRASOUND GUIDED THORACENTESIS MEDICATIONS: 1% lidocaine 5 mL COMPLICATIONS: None immediate.  No pneumothorax on follow-up radiograph. PROCEDURE: An ultrasound guided thoracentesis was thoroughly discussed with the patient and questions answered. The benefits, risks, alternatives and complications were also discussed. The patient understands and wishes to proceed with the procedure. Written consent was obtained. Ultrasound was performed to localize and mark an adequate pocket of fluid in the left chest. The area was then prepped and draped in the normal sterile fashion. 1% Lidocaine was used for local anesthesia. Under ultrasound guidance a 6 Fr Safe-T-Centesis  catheter was introduced. Thoracentesis was performed. The catheter was removed and a dressing applied. FINDINGS: A total of approximately 1.6 L of dark yellow fluid was removed. IMPRESSION: Successful ultrasound guided left thoracentesis yielding 1.6 L of pleural fluid. Read by: Soyla Dryer, NP Electronically Signed   By: Lucrezia Europe M.D.   On: 05/07/2020 13:18   Assessment and Plan:  1.  Primary mediastinal B-cell lymphoma 2.  Pleural effusion secondary to #1, improved 3.  Cough due to #1 and #2, improved  -Admit to inpatient oncology unit.  Plan to proceed with cycle #2 of systemic chemotherapy as planned.  He is aware of the possible adverse effects and is agreeable to proceed.  We will review labs prior to chemotherapy administration. -Regular diet. -Lovenox for DVT prophylaxis. -Continue home medications. -The patient has antiemetics  and stool softeners available to him.  Mikey Bussing, DNP, AGPCNP-BC, AOCNP  ADDENDUM: I saw and examined Mr. Frate.  I agree with the above assessment by Erasmo Downer.  I am very impressed with his chest x-ray.  This mediastinal mass is really decreased in size.  There is no left pleural effusion.  His cough is much better.  His breathing seems to be doing much better.  He tolerated his first cycle of chemotherapy quite well.  He had very few toxicities.  He did lose his hair.  He did have the PET scan done after his first cycle.  This did show the uptake in the mediastinal mass.  He had uptake also in the left pleura.  I am sure this is why had the pleural effusion.  We will proceed with his second cycle of chemotherapy.  I will make sure that he has his oral mouth rinses.  His labs really look good.  I know that he will get incredible care from all staff up on [address].  I appreciate all their compassion and professionalism.  Lattie Haw, MD  Colossians 3:4

## 2020-05-26 NOTE — Progress Notes (Signed)
Patient has been successfully admitted to [facility] for initiation of cycle two of [place]. Dr Marin Olp would like to see the patient in about 2 weeks in the office. Appointment made.   Oncology Nurse Navigator Documentation  Oncology Nurse Navigator Flowsheets 05/26/2020  Abnormal Finding Date -  Confirmed Diagnosis Date -  Diagnosis Status -  Planned Course of Treatment -  Phase of Treatment -  Chemotherapy Actual Start Date: -  Navigator Follow Up Date: 06/10/2020  Navigator Follow Up Reason: Follow-up Appointment  Navigator Location [facility]  Navigator Encounter Type Appt/Treatment Plan Review  Telephone -  Treatment Initiated Date -  Patient Visit Type MedOnc  Treatment Phase Active Tx  Barriers/Navigation Needs Coordination of Care  Education -  Interventions Coordination of Care  Acuity Level 3-Moderate Needs (3-4 Barriers Identified)  Coordination of Care Appts  Education Method -  Support Groups/Services Friends and Family  Time Spent with Patient 30

## 2020-05-27 ENCOUNTER — Encounter: Payer: Self-pay | Admitting: *Deleted

## 2020-05-27 ENCOUNTER — Other Ambulatory Visit: Payer: Self-pay | Admitting: Hematology & Oncology

## 2020-05-27 DIAGNOSIS — C8582 Other specified types of non-Hodgkin lymphoma, intrathoracic lymph nodes: Secondary | ICD-10-CM

## 2020-05-27 DIAGNOSIS — C8520 Mediastinal (thymic) large B-cell lymphoma, unspecified site: Secondary | ICD-10-CM

## 2020-05-27 MED ORDER — SODIUM BICARBONATE/SODIUM CHLORIDE MOUTHWASH
OROMUCOSAL | Status: DC
  Administered 2020-05-27 – 2020-05-29 (×2): 1 via OROMUCOSAL
  Filled 2020-05-27: qty 1000

## 2020-05-27 MED ORDER — SODIUM CHLORIDE 0.9 % IV SOLN
Freq: Once | INTRAVENOUS | Status: AC
  Administered 2020-05-27: 18 mg via INTRAVENOUS
  Filled 2020-05-27: qty 4

## 2020-05-27 MED ORDER — VINCRISTINE SULFATE CHEMO INJECTION 1 MG/ML
Freq: Once | INTRAVENOUS | Status: AC
  Filled 2020-05-27: qty 11

## 2020-05-27 NOTE — Progress Notes (Signed)
rituxan will be given inpt on Fri 10/29.  No daily labs per Dr. Marin Olp

## 2020-05-27 NOTE — Progress Notes (Signed)
Dosage and calculation of doxorubicin, etoposide and vincristine verified by Rick Duff

## 2020-05-28 ENCOUNTER — Other Ambulatory Visit: Payer: Self-pay

## 2020-05-28 LAB — CBC WITH DIFFERENTIAL/PLATELET
Abs Immature Granulocytes: 0.34 10*3/uL — ABNORMAL HIGH (ref 0.00–0.07)
Basophils Absolute: 0.1 10*3/uL (ref 0.0–0.1)
Basophils Relative: 0 %
Eosinophils Absolute: 0 10*3/uL (ref 0.0–0.5)
Eosinophils Relative: 0 %
HCT: 34.5 % — ABNORMAL LOW (ref 39.0–52.0)
Hemoglobin: 11.6 g/dL — ABNORMAL LOW (ref 13.0–17.0)
Immature Granulocytes: 1 %
Lymphocytes Relative: 3 %
Lymphs Abs: 0.6 10*3/uL — ABNORMAL LOW (ref 0.7–4.0)
MCH: 28.9 pg (ref 26.0–34.0)
MCHC: 33.6 g/dL (ref 30.0–36.0)
MCV: 86 fL (ref 80.0–100.0)
Monocytes Absolute: 1.2 10*3/uL — ABNORMAL HIGH (ref 0.1–1.0)
Monocytes Relative: 5 %
Neutro Abs: 21.4 10*3/uL — ABNORMAL HIGH (ref 1.7–7.7)
Neutrophils Relative %: 91 %
Platelets: 422 10*3/uL — ABNORMAL HIGH (ref 150–400)
RBC: 4.01 MIL/uL — ABNORMAL LOW (ref 4.22–5.81)
RDW: 15.9 % — ABNORMAL HIGH (ref 11.5–15.5)
WBC: 23.7 10*3/uL — ABNORMAL HIGH (ref 4.0–10.5)
nRBC: 0 % (ref 0.0–0.2)

## 2020-05-28 LAB — COMPREHENSIVE METABOLIC PANEL
ALT: 61 U/L — ABNORMAL HIGH (ref 0–44)
AST: 31 U/L (ref 15–41)
Albumin: 3.4 g/dL — ABNORMAL LOW (ref 3.5–5.0)
Alkaline Phosphatase: 97 U/L (ref 38–126)
Anion gap: 9 (ref 5–15)
BUN: 17 mg/dL (ref 6–20)
CO2: 21 mmol/L — ABNORMAL LOW (ref 22–32)
Calcium: 8.9 mg/dL (ref 8.9–10.3)
Chloride: 107 mmol/L (ref 98–111)
Creatinine, Ser: 0.86 mg/dL (ref 0.61–1.24)
GFR, Estimated: >60 mL/min (ref >60)
Glucose, Bld: 126 mg/dL — ABNORMAL HIGH (ref 70–99)
Potassium: 3.5 mmol/L (ref 3.5–5.1)
Sodium: 137 mmol/L (ref 135–145)
Total Bilirubin: 0.4 mg/dL (ref 0.3–1.2)
Total Protein: 5.8 g/dL — ABNORMAL LOW (ref 6.5–8.1)

## 2020-05-28 LAB — URIC ACID: Uric Acid, Serum: 3.5 mg/dL — ABNORMAL LOW (ref 3.7–8.6)

## 2020-05-28 LAB — LACTATE DEHYDROGENASE: LDH: 173 U/L (ref 98–192)

## 2020-05-28 MED ORDER — SODIUM CHLORIDE 0.9 % IV SOLN
Freq: Once | INTRAVENOUS | Status: AC
  Administered 2020-05-28: 18 mg via INTRAVENOUS
  Filled 2020-05-28: qty 4

## 2020-05-28 MED ORDER — VINCRISTINE SULFATE CHEMO INJECTION 1 MG/ML
Freq: Once | INTRAVENOUS | Status: AC
  Filled 2020-05-28: qty 11

## 2020-05-28 NOTE — Progress Notes (Signed)
Travis Duran is doing great.  He has had no problems with the chemotherapy.  The nurses are doing a incredibly wonderful job with him in the chemotherapy.  I really appreciate the outstanding job that they are doing with him and get keeping the chemotherapy going.  He has had no problems with nausea or vomiting.  He is no mouth sores.  He has had no diarrhea.  He has had no pain.  The cough is pretty much gone now.  His chest x-ray looked a whole lot better.  There is no problems with fever.  He has had no rashes.  His labs show white cell count of 23.7.  Hemoglobin 11.6.  Platelet count 422,000.  His uric acid is only 3.5.  BUN is 17 creatinine 0.86.  Blood sugar is 126.  His vital signs are temperature 98.1.  Pulse 81.  Blood pressure 124/68.  His lungs sound clear bilaterally.  He has good air movement bilaterally.  No wheezes are noted.  Oral exam shows no mucositis.  Cardiac exam regular rate and rhythm.  There are no murmurs, rubs or bruits.  Abdomen is soft.  Bowel sounds are present.  There is no fluid wave.  There is no palpable liver or spleen tip.  Skin exam shows no rashes, ecchymoses or petechia.  Extremities shows no clubbing, cyanosis or edema.  Neurological exam shows no focal neurological deficits.  We will continue chemotherapy.  This is day 3 of chemotherapy.  I suspect that we should be able to get him home by Friday.  He will get Rituxan as an inpatient.  He will get this at the end of the chemotherapy.  We will check labs on Friday.  Lattie Haw, MD  1 Chronicles 16:34

## 2020-05-29 MED ORDER — VINCRISTINE SULFATE CHEMO INJECTION 1 MG/ML
Freq: Once | INTRAVENOUS | Status: AC
  Filled 2020-05-29: qty 11

## 2020-05-29 MED ORDER — SODIUM CHLORIDE 0.9 % IV SOLN
Freq: Once | INTRAVENOUS | Status: AC
  Administered 2020-05-29: 8 mg via INTRAVENOUS
  Filled 2020-05-29: qty 4

## 2020-05-29 NOTE — Progress Notes (Signed)
Mr. Almanza is still doing quite well.  So far, he has had no problems with chemotherapy.  This will be day 4 of treatment.  Hopefully, we should be able to get him home tomorrow.  He has had no nausea or vomiting.  He has had no diarrhea.  Has been out of bed walking.  His appetite is okay.  He has had no bleeding.  There is no cough or shortness of breath.  There has been no rashes.  He has had no leg swelling.  His vital signs show temperature 97.8.  Pulse 74.  Blood pressure 127/86.  Oral exam shows no mucositis.  He has no adenopathy on the neck.  Lungs are clear bilaterally.  No wheezes are noted.  Cardiac exam regular rate and rhythm with no murmurs, rubs or bruits.  Abdomen is soft.  Bowel sounds are present.  There is no guarding or rebound tenderness.  There is no palpable liver or spleen tip.  Extremities shows no clubbing, cyanosis or edema.  He has good strength bilaterally.  He has good range of motion of his joints.  Neurological exam is nonfocal.  Hopefully, we will be able to have Mr. Wery go home tomorrow.  He will get Rituxan after chemotherapy.  I will check his labs tomorrow.  I very much appreciate the outstanding care that he is getting from the staff up on [address].  So far, there really has been no issues with this hospital stay.  Everything is gone very smoothly.  I believe this is mostly because the [address]. staff are so good.   Lattie Haw, MD  Psalm 115:15

## 2020-05-30 LAB — CBC WITH DIFFERENTIAL/PLATELET
Abs Immature Granulocytes: 0.06 10*3/uL (ref 0.00–0.07)
Basophils Absolute: 0 10*3/uL (ref 0.0–0.1)
Basophils Relative: 0 %
Eosinophils Absolute: 0 10*3/uL (ref 0.0–0.5)
Eosinophils Relative: 0 %
HCT: 32.5 % — ABNORMAL LOW (ref 39.0–52.0)
Hemoglobin: 11 g/dL — ABNORMAL LOW (ref 13.0–17.0)
Immature Granulocytes: 1 %
Lymphocytes Relative: 6 %
Lymphs Abs: 0.5 10*3/uL — ABNORMAL LOW (ref 0.7–4.0)
MCH: 28.4 pg (ref 26.0–34.0)
MCHC: 33.8 g/dL (ref 30.0–36.0)
MCV: 84 fL (ref 80.0–100.0)
Monocytes Absolute: 0.4 10*3/uL (ref 0.1–1.0)
Monocytes Relative: 5 %
Neutro Abs: 7.6 10*3/uL (ref 1.7–7.7)
Neutrophils Relative %: 88 %
Platelets: 425 10*3/uL — ABNORMAL HIGH (ref 150–400)
RBC: 3.87 MIL/uL — ABNORMAL LOW (ref 4.22–5.81)
RDW: 15.2 % (ref 11.5–15.5)
WBC: 8.6 10*3/uL (ref 4.0–10.5)
nRBC: 0 % (ref 0.0–0.2)

## 2020-05-30 LAB — COMPREHENSIVE METABOLIC PANEL
ALT: 56 U/L — ABNORMAL HIGH (ref 0–44)
AST: 21 U/L (ref 15–41)
Albumin: 3.2 g/dL — ABNORMAL LOW (ref 3.5–5.0)
Alkaline Phosphatase: 85 U/L (ref 38–126)
Anion gap: 6 (ref 5–15)
BUN: 17 mg/dL (ref 6–20)
CO2: 24 mmol/L (ref 22–32)
Calcium: 8.7 mg/dL — ABNORMAL LOW (ref 8.9–10.3)
Chloride: 105 mmol/L (ref 98–111)
Creatinine, Ser: 0.77 mg/dL (ref 0.61–1.24)
GFR, Estimated: >60 mL/min (ref >60)
Glucose, Bld: 139 mg/dL — ABNORMAL HIGH (ref 70–99)
Potassium: 3.5 mmol/L (ref 3.5–5.1)
Sodium: 135 mmol/L (ref 135–145)
Total Bilirubin: 0.4 mg/dL (ref 0.3–1.2)
Total Protein: 5.8 g/dL — ABNORMAL LOW (ref 6.5–8.1)

## 2020-05-30 LAB — LACTATE DEHYDROGENASE: LDH: 136 U/L (ref 98–192)

## 2020-05-30 LAB — URIC ACID: Uric Acid, Serum: 3.5 mg/dL — ABNORMAL LOW (ref 3.7–8.6)

## 2020-05-30 MED ORDER — DIPHENHYDRAMINE HCL 50 MG PO CAPS
50.0000 mg | ORAL_CAPSULE | Freq: Once | ORAL | Status: AC
  Administered 2020-05-30: 50 mg via ORAL
  Filled 2020-05-30: qty 1

## 2020-05-30 MED ORDER — HEPARIN SOD (PORK) LOCK FLUSH 100 UNIT/ML IV SOLN
500.0000 [IU] | INTRAVENOUS | Status: AC | PRN
  Administered 2020-05-30: 500 [IU]
  Filled 2020-05-30: qty 5

## 2020-05-30 MED ORDER — SODIUM BICARBONATE/SODIUM CHLORIDE MOUTHWASH: 1.0000 "application " | OROMUCOSAL | Status: DC

## 2020-05-30 MED ORDER — CIPROFLOXACIN HCL 500 MG PO TABS
500.0000 mg | ORAL_TABLET | Freq: Every day | ORAL | Status: DC
  Administered 2020-05-30: 500 mg via ORAL
  Filled 2020-05-30 (×2): qty 1

## 2020-05-30 MED ORDER — ACETAMINOPHEN 325 MG PO TABS
650.0000 mg | ORAL_TABLET | Freq: Once | ORAL | Status: AC
  Administered 2020-05-30: 650 mg via ORAL
  Filled 2020-05-30: qty 2

## 2020-05-30 MED ORDER — SODIUM CHLORIDE 0.9 % IV SOLN
750.0000 mg/m2 | Freq: Once | INTRAVENOUS | Status: AC
  Administered 2020-05-30: 1580 mg via INTRAVENOUS
  Filled 2020-05-30: qty 79

## 2020-05-30 MED ORDER — SODIUM CHLORIDE 0.9 % IV SOLN
Freq: Once | INTRAVENOUS | Status: AC
  Administered 2020-05-30: 36 mg via INTRAVENOUS
  Filled 2020-05-30: qty 8

## 2020-05-30 MED ORDER — SODIUM CHLORIDE 0.9 % IV SOLN
375.0000 mg/m2 | Freq: Once | INTRAVENOUS | Status: AC
  Administered 2020-05-30: 800 mg via INTRAVENOUS
  Filled 2020-05-30: qty 50
  Filled 2020-05-30: qty 80

## 2020-05-30 NOTE — Discharge Summary (Signed)
Discharge Summary  Patient ID: Travis Duran MRN: [id number] DOB/AGE: 1976/04/17 44 y.o.  Admit date: 05/26/2020 Discharge date: 05/30/2020  Discharge Diagnoses:  Active Problems:   Lymphoma, large cell, intrathoracic lymph nodes (HCC)   Encounter for antineoplastic chemotherapy   Diffuse large B cell lymphoma (HCC)   Discharged Condition: good  Discharge Labs:   CBC    Component Value Date/Time   WBC 8.6 05/30/2020 0420   RBC 3.87 (L) 05/30/2020 0420   HGB 11.0 (L) 05/30/2020 0420   HGB 12.4 (L) 05/16/2020 1315   HCT 32.5 (L) 05/30/2020 0420   PLT 425 (H) 05/30/2020 0420   PLT 209 05/16/2020 1315   MCV 84.0 05/30/2020 0420   MCH 28.4 05/30/2020 0420   MCHC 33.8 05/30/2020 0420   RDW 15.2 05/30/2020 0420   LYMPHSABS 0.5 (L) 05/30/2020 0420   MONOABS 0.4 05/30/2020 0420   EOSABS 0.0 05/30/2020 0420   BASOSABS 0.0 05/30/2020 0420   CMP Latest Ref Rng & Units 05/30/2020 05/28/2020 05/26/2020  Glucose 70 - 99 mg/dL 139(H) 126(H) 116(H)  BUN 6 - 20 mg/dL 17 17 22(H)  Creatinine 0.61 - 1.24 mg/dL 0.77 0.86 0.95  Sodium 135 - 145 mmol/L 135 137 138  Potassium 3.5 - 5.1 mmol/L 3.5 3.5 3.9  Chloride 98 - 111 mmol/L 105 107 105  CO2 22 - 32 mmol/L 24 21(L) 24  Calcium 8.9 - 10.3 mg/dL 8.7(L) 8.9 9.0  Total Protein 6.5 - 8.1 g/dL 5.8(L) 5.8(L) 6.3(L)  Total Bilirubin 0.3 - 1.2 mg/dL 0.4 0.4 0.5  Alkaline Phos 38 - 126 U/L 85 97 108  AST 15 - 41 U/L 21 31 21   ALT 0 - 44 U/L 56(H) 61(H) 42   Diagnostics: 1.  05/26/2020-chest x-ray which showed interval decrease in the size of the anterior mediastinal mass, significant decrease in volume of the left pleural effusion, mild residual subsegmental atelectasis noted in the left base  Consults: None  Procedures:  None  Disposition:  Discharge disposition: 01-Home or Self Care      Allergies as of 05/30/2020   No Known Allergies     Medication List    STOP taking these medications   allopurinol 100 MG  tablet Commonly known as: ZYLOPRIM   cyclobenzaprine 10 MG tablet Commonly known as: FLEXERIL     TAKE these medications   amoxicillin 500 MG capsule Commonly known as: AMOXIL Take 1,000 mg by mouth See admin instructions. Take 1000mg  twice a day prior to dental procedures.   antiseptic oral rinse Liqd 15 mLs by Mouth Rinse route every 4 (four) hours.   ciprofloxacin 500 MG tablet Commonly known as: CIPRO Take 1 tablet (500 mg total) by mouth daily with breakfast.   diphenhydrAMINE HCl (Sleep) 25 MG Caps Take 25 mg by mouth at bedtime as needed (sleep). Uses generic diphenhydramine or ZZZQuil.   famciclovir 500 MG tablet Commonly known as: FAMVIR Take 1 tablet (500 mg total) by mouth daily.   Flovent HFA 110 MCG/ACT inhaler Generic drug: fluticasone Inhale 2 puffs into the lungs in the morning and at bedtime.   HYDROcodone-homatropine 5-1.5 MG/5ML syrup Commonly known as: Hycodan Take 5 mLs by mouth every 6 (six) hours as needed for cough.   Melatonin 3 MG Caps Take 3 mg by mouth at bedtime as needed (sleep).   ondansetron 4 MG tablet Commonly known as: ZOFRAN Take 1-2 tablets (4-8 mg total) by mouth every 8 (eight) hours as needed for nausea (not responsive to prochlorperazine (COMPAZINE)).  prochlorperazine 10 MG tablet Commonly known as: COMPAZINE Take 1 tablet (10 mg total) by mouth every 6 (six) hours as needed for nausea or vomiting.   sodium bicarbonate/sodium chloride Soln 1 application by Mouth Rinse route every 4 (four) hours.   tadalafil 10 MG tablet Commonly known as: Cialis Take 1 tablet (10 mg total) by mouth daily as needed for erectile dysfunction.        HPI:  Travis Duran is a 44 year old male who was seen for initial consult during his hospitalization on 04/25/2020.  The patient had progressive shortness of breath over a 6-week period of time and a CT of the chest performed on 04/24/2020 showed a large malignant appearing left anterior  mediastinal mass measuring 14.6 x 11.7 x 18.5 cm, large left pleural effusion, and small pericardial effusion.  He underwent several thoracenteses during his hospitalization as well as a CT-guided biopsy of the mediastinal mass.  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  Echocardiogram performed on 04/27/2020 showed a LVEF of 60 to 65%.    The patient was started on systemic chemotherapy with EPOCH-R.    A PET scan performed after only 1 cycle of chemotherapy showed considerable reduction in the size of the anterior mediastinal mass and in the left pleural tumor deposits.   The patient was seen on the day of admission for cycle #2 of his chemotherapy.  He felt well overall.  He had had a recent dental extraction and did not have any significant bleeding after the procedure.  His cough and shortness of breath are significantly improved.  He did not have any recent fevers, chills, mucositis, chest pain.  He was not having any abdominal pain, nausea, vomiting, constipation, diarrhea.  No bleeding reported. He was admitted for cycle #2 of EPOCH-R.  Hospital Course:  Travis Duran started his chemotherapy as planned on 05/26/2020.  He completed 5 days of his chemotherapy and also received rituximab on day 5.  He tolerated his treatment well overall.  He developed leukocytosis and thrombocytosis during the admission likely due to steroid.  Leukocytosis had resolved on the day of discharge.  He also has mild anemia secondary to his chemotherapy.  LDH normalized this admission during.    The patient was seen in the morning of 05/30/2020 and has remained stable.  Once his chemotherapy and rituximab are complete, he will be discharged home.  Physical Exam Vitals reviewed.  Constitutional:      General: He is not in acute distress. HENT:     Head: Normocephalic.     Nose: Nose normal.      Mouth/Throat:     Pharynx: Oropharynx is clear.  Eyes:     General: No scleral icterus.    Conjunctiva/sclera: Conjunctivae normal.  Cardiovascular:     Rate and Rhythm: Normal rate and regular rhythm.     Pulses: Normal pulses.     Heart sounds: Normal heart sounds.  Pulmonary:     Effort: Pulmonary effort is normal.     Comments: Diminished Left lung, clear on right Abdominal:     General: Bowel sounds are normal. There is no distension.     Palpations: Abdomen is soft.  Musculoskeletal:        General: Normal range of motion.  Skin:    General: Skin is warm and dry.  Neurological:     General: No focal deficit present.  Mental Status: He is alert and oriented to person, place, and time.  Psychiatric:        Mood and Affect: Mood normal.        Behavior: Behavior normal.        Thought Content: Thought content normal.        Judgment: Judgment normal.    He is scheduled for Neulasta injection on 06/02/2020 and a follow-up visit with lab work on 06/10/2020.  SignedMikey Bussing 05/30/2020, 9:46 AM

## 2020-05-30 NOTE — Progress Notes (Signed)
Patient due for cyclophosphamide and rituximab-pvvr. Patient's BSA, drug dosages, total VTBI, expiration date/time, clarity of medications, and patient identifiers independently verified by me and by Aldean Baker, RN. Zandra Abts [facility] 05/30/2020 11:15 AM

## 2020-06-02 ENCOUNTER — Ambulatory Visit: Payer: BC Managed Care – PPO

## 2020-06-02 ENCOUNTER — Encounter: Payer: Self-pay | Admitting: *Deleted

## 2020-06-03 ENCOUNTER — Inpatient Hospital Stay: Payer: BC Managed Care – PPO | Attending: Hematology & Oncology

## 2020-06-03 ENCOUNTER — Other Ambulatory Visit: Payer: Self-pay

## 2020-06-03 VITALS — BP 109/79 | HR 97 | Temp 97.7°F | Resp 18

## 2020-06-03 DIAGNOSIS — Z5189 Encounter for other specified aftercare: Secondary | ICD-10-CM | POA: Insufficient documentation

## 2020-06-03 DIAGNOSIS — Z452 Encounter for adjustment and management of vascular access device: Secondary | ICD-10-CM | POA: Insufficient documentation

## 2020-06-03 DIAGNOSIS — C8520 Mediastinal (thymic) large B-cell lymphoma, unspecified site: Secondary | ICD-10-CM | POA: Insufficient documentation

## 2020-06-03 DIAGNOSIS — C8332 Diffuse large B-cell lymphoma, intrathoracic lymph nodes: Secondary | ICD-10-CM

## 2020-06-03 DIAGNOSIS — Z5111 Encounter for antineoplastic chemotherapy: Secondary | ICD-10-CM

## 2020-06-03 DIAGNOSIS — C8582 Other specified types of non-Hodgkin lymphoma, intrathoracic lymph nodes: Secondary | ICD-10-CM

## 2020-06-03 MED ORDER — PEGFILGRASTIM-CBQV 6 MG/0.6ML ~~LOC~~ SOSY
PREFILLED_SYRINGE | SUBCUTANEOUS | Status: AC
  Filled 2020-06-03: qty 0.6

## 2020-06-03 MED ORDER — PEGFILGRASTIM-CBQV 6 MG/0.6ML ~~LOC~~ SOSY
6.0000 mg | PREFILLED_SYRINGE | Freq: Once | SUBCUTANEOUS | Status: AC
  Administered 2020-06-03: 6 mg via SUBCUTANEOUS

## 2020-06-03 NOTE — Patient Instructions (Signed)
Pegfilgrastim injection Travis Duran) What is this medicine? PEGFILGRASTIM (PEG fil gra stim) is a long-acting granulocyte colony-stimulating factor that stimulates the growth of neutrophils, a type of white blood cell important in the body's fight against infection. It is used to reduce the incidence of fever and infection in patients with certain types of cancer who are receiving chemotherapy that affects the bone marrow, and to increase survival after being exposed to high doses of radiation. This medicine may be used for other purposes; ask your health care provider or pharmacist if you have questions. COMMON BRAND NAME(S): Steve Rattler, Ziextenzo What should I tell my health care provider before I take this medicine? They need to know if you have any of these conditions:  kidney disease  latex allergy  ongoing radiation therapy  sickle cell disease  skin reactions to acrylic adhesives (On-Body Injector only)  an unusual or allergic reaction to pegfilgrastim, filgrastim, other medicines, foods, dyes, or preservatives  pregnant or trying to get pregnant  breast-feeding How should I use this medicine? This medicine is for injection under the skin. If you get this medicine at home, you will be taught how to prepare and give the pre-filled syringe or how to use the On-body Injector. Refer to the patient Instructions for Use for detailed instructions. Use exactly as directed. Tell your healthcare provider immediately if you suspect that the On-body Injector may not have performed as intended or if you suspect the use of the On-body Injector resulted in a missed or partial dose. It is important that you put your used needles and syringes in a special sharps container. Do not put them in a trash can. If you do not have a sharps container, call your pharmacist or healthcare provider to get one. Talk to your pediatrician regarding the use of this medicine in children. While this drug  may be prescribed for selected conditions, precautions do apply. Overdosage: If you think you have taken too much of this medicine contact a poison control center or emergency room at once. NOTE: This medicine is only for you. Do not share this medicine with others. What if I miss a dose? It is important not to miss your dose. Call your doctor or health care professional if you miss your dose. If you miss a dose due to an On-body Injector failure or leakage, a new dose should be administered as soon as possible using a single prefilled syringe for manual use. What may interact with this medicine? Interactions have not been studied. Give your health care provider a list of all the medicines, herbs, non-prescription drugs, or dietary supplements you use. Also tell them if you smoke, drink alcohol, or use illegal drugs. Some items may interact with your medicine. This list may not describe all possible interactions. Give your health care provider a list of all the medicines, herbs, non-prescription drugs, or dietary supplements you use. Also tell them if you smoke, drink alcohol, or use illegal drugs. Some items may interact with your medicine. What should I watch for while using this medicine? You may need blood work done while you are taking this medicine. If you are going to need a MRI, CT scan, or other procedure, tell your doctor that you are using this medicine (On-Body Injector only). What side effects may I notice from receiving this medicine? Side effects that you should report to your doctor or health care professional as soon as possible:  allergic reactions like skin rash, itching or hives, swelling of  the face, lips, or tongue  back pain  dizziness  fever  pain, redness, or irritation at site where injected  pinpoint red spots on the skin  red or dark-brown urine  shortness of breath or breathing problems  stomach or side pain, or pain at the  shoulder  swelling  tiredness  trouble passing urine or change in the amount of urine Side effects that usually do not require medical attention (report to your doctor or health care professional if they continue or are bothersome):  bone pain  muscle pain This list may not describe all possible side effects. Call your doctor for medical advice about side effects. You may report side effects to FDA at 1-800-FDA-1088. Where should I keep my medicine? Keep out of the reach of children. If you are using this medicine at home, you will be instructed on how to store it. Throw away any unused medicine after the expiration date on the label. NOTE: This sheet is a summary. It may not cover all possible information. If you have questions about this medicine, talk to your doctor, pharmacist, or health care provider.  2020 Elsevier/Gold Standard (2017-10-24 16:57:08)

## 2020-06-05 ENCOUNTER — Encounter: Payer: Self-pay | Admitting: *Deleted

## 2020-06-05 NOTE — Progress Notes (Signed)
Oncology Nurse Navigator Documentation  Oncology Nurse Navigator Flowsheets 06/05/2020  Abnormal Finding Date -  Confirmed Diagnosis Date -  Diagnosis Status -  Planned Course of Treatment -  Phase of Treatment -  Chemotherapy Actual Start Date: -  Navigator Follow Up Date: 06/10/2020  Navigator Follow Up Reason: Follow-up Appointment  Navigator Location [facility]  Navigator Encounter Type MyChart  Telephone FMLA/Disability  Treatment Initiated Date -  Patient Visit Type MedOnc  Treatment Phase Active Tx  Barriers/Navigation Needs Coordination of Care  Education -  Interventions Disability/FMLA  Acuity Level 3-Moderate Needs (3-4 Barriers Identified)  Coordination of Care -  Education Method -  Support Groups/Services Friends and Family  Time Spent with Patient 30

## 2020-06-09 ENCOUNTER — Other Ambulatory Visit: Payer: Self-pay | Admitting: *Deleted

## 2020-06-09 DIAGNOSIS — Z5111 Encounter for antineoplastic chemotherapy: Secondary | ICD-10-CM

## 2020-06-09 DIAGNOSIS — C8332 Diffuse large B-cell lymphoma, intrathoracic lymph nodes: Secondary | ICD-10-CM

## 2020-06-09 DIAGNOSIS — C8582 Other specified types of non-Hodgkin lymphoma, intrathoracic lymph nodes: Secondary | ICD-10-CM

## 2020-06-09 LAB — ACID FAST CULTURE WITH REFLEXED SENSITIVITIES (MYCOBACTERIA): Acid Fast Culture: NEGATIVE

## 2020-06-10 ENCOUNTER — Inpatient Hospital Stay: Payer: BC Managed Care – PPO

## 2020-06-10 ENCOUNTER — Inpatient Hospital Stay (HOSPITAL_BASED_OUTPATIENT_CLINIC_OR_DEPARTMENT_OTHER): Payer: BC Managed Care – PPO | Admitting: Hematology & Oncology

## 2020-06-10 ENCOUNTER — Encounter: Payer: Self-pay | Admitting: *Deleted

## 2020-06-10 ENCOUNTER — Other Ambulatory Visit: Payer: Self-pay | Admitting: Hematology & Oncology

## 2020-06-10 ENCOUNTER — Encounter: Payer: Self-pay | Admitting: Hematology & Oncology

## 2020-06-10 ENCOUNTER — Other Ambulatory Visit: Payer: Self-pay

## 2020-06-10 ENCOUNTER — Other Ambulatory Visit: Payer: Self-pay | Admitting: *Deleted

## 2020-06-10 VITALS — BP 130/68 | HR 101 | Temp 98.7°F | Resp 16 | Wt 189.5 lb

## 2020-06-10 DIAGNOSIS — C8582 Other specified types of non-Hodgkin lymphoma, intrathoracic lymph nodes: Secondary | ICD-10-CM | POA: Diagnosis not present

## 2020-06-10 DIAGNOSIS — Z5111 Encounter for antineoplastic chemotherapy: Secondary | ICD-10-CM

## 2020-06-10 DIAGNOSIS — C8332 Diffuse large B-cell lymphoma, intrathoracic lymph nodes: Secondary | ICD-10-CM

## 2020-06-10 DIAGNOSIS — C8520 Mediastinal (thymic) large B-cell lymphoma, unspecified site: Secondary | ICD-10-CM | POA: Diagnosis not present

## 2020-06-10 DIAGNOSIS — Z1159 Encounter for screening for other viral diseases: Secondary | ICD-10-CM

## 2020-06-10 DIAGNOSIS — Z95828 Presence of other vascular implants and grafts: Secondary | ICD-10-CM

## 2020-06-10 DIAGNOSIS — C8330 Diffuse large B-cell lymphoma, unspecified site: Secondary | ICD-10-CM

## 2020-06-10 LAB — CBC WITH DIFFERENTIAL (CANCER CENTER ONLY)
Abs Immature Granulocytes: 11.5 10*3/uL — ABNORMAL HIGH (ref 0.00–0.07)
Band Neutrophils: 15 %
Basophils Absolute: 0 10*3/uL (ref 0.0–0.1)
Basophils Relative: 0 %
Eosinophils Absolute: 0 10*3/uL (ref 0.0–0.5)
Eosinophils Relative: 0 %
HCT: 31.1 % — ABNORMAL LOW (ref 39.0–52.0)
Hemoglobin: 10 g/dL — ABNORMAL LOW (ref 13.0–17.0)
Lymphocytes Relative: 4 %
Lymphs Abs: 1.7 10*3/uL (ref 0.7–4.0)
MCH: 28.1 pg (ref 26.0–34.0)
MCHC: 32.2 g/dL (ref 30.0–36.0)
MCV: 87.4 fL (ref 80.0–100.0)
Metamyelocytes Relative: 15 %
Monocytes Absolute: 3.4 10*3/uL — ABNORMAL HIGH (ref 0.1–1.0)
Monocytes Relative: 8 %
Myelocytes: 10 %
Neutro Abs: 26 10*3/uL — ABNORMAL HIGH (ref 1.7–7.7)
Neutrophils Relative %: 46 %
Platelet Count: 189 10*3/uL (ref 150–400)
Promyelocytes Relative: 2 %
RBC: 3.56 MIL/uL — ABNORMAL LOW (ref 4.22–5.81)
RDW: 17.2 % — ABNORMAL HIGH (ref 11.5–15.5)
WBC Count: 42.6 10*3/uL — ABNORMAL HIGH (ref 4.0–10.5)
nRBC: 0.5 % — ABNORMAL HIGH (ref 0.0–0.2)

## 2020-06-10 LAB — CMP (CANCER CENTER ONLY)
ALT: 18 U/L (ref 0–44)
AST: 20 U/L (ref 15–41)
Albumin: 3.6 g/dL (ref 3.5–5.0)
Alkaline Phosphatase: 130 U/L — ABNORMAL HIGH (ref 38–126)
Anion gap: 7 (ref 5–15)
BUN: 13 mg/dL (ref 6–20)
CO2: 28 mmol/L (ref 22–32)
Calcium: 9.2 mg/dL (ref 8.9–10.3)
Chloride: 104 mmol/L (ref 98–111)
Creatinine: 1.06 mg/dL (ref 0.61–1.24)
GFR, Estimated: >60 mL/min (ref >60)
Glucose, Bld: 116 mg/dL — ABNORMAL HIGH (ref 70–99)
Potassium: 3.5 mmol/L (ref 3.5–5.1)
Sodium: 139 mmol/L (ref 135–145)
Total Bilirubin: 0.2 mg/dL — ABNORMAL LOW (ref 0.3–1.2)
Total Protein: 5.9 g/dL — ABNORMAL LOW (ref 6.5–8.1)

## 2020-06-10 MED ORDER — CIPROFLOXACIN HCL 500 MG PO TABS: 500.0000 mg | ORAL_TABLET | Freq: Every day | ORAL | 5 refills | Status: DC

## 2020-06-10 MED ORDER — SODIUM CHLORIDE 0.9% FLUSH
10.0000 mL | INTRAVENOUS | Status: DC | PRN
  Administered 2020-06-10: 10 mL via INTRAVENOUS
  Filled 2020-06-10: qty 10

## 2020-06-10 MED ORDER — HEPARIN SOD (PORK) LOCK FLUSH 100 UNIT/ML IV SOLN
500.0000 [IU] | Freq: Once | INTRAVENOUS | Status: AC
  Administered 2020-06-10: 500 [IU] via INTRAVENOUS
  Filled 2020-06-10: qty 5

## 2020-06-10 MED FILL — CIPROFLOXACIN HCL 500 MG TA: 500 | 30 days supply | Qty: 30 | Fill #0

## 2020-06-10 NOTE — Progress Notes (Signed)
Hematology and Oncology Follow Up Visit  Travis Duran [id number] 01/17/1976 44 y.o. 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  He has had 2 cycles of chemotherapy today.  He is done really well.  He is responded as expected.  He does not have any cough or shortness of breath.  There is no chest wall pain.  He did have a tooth pulled that was infected.  The oral surgeon did a very good job on this.  He has had no issues with diarrhea.  There is been no problems with nausea or vomiting.  His weight is being maintained.  He has had no issues with fever.  He is on ciprofloxacin after chemotherapy for his projected neutropenia.  His white cell count is certainly on the high side because of the Neulasta injection.  He has had no rashes.  Overall, his performance status is ECOG 1.    Medications:  Current Outpatient Medications:  .  amoxicillin (AMOXIL) 500 MG capsule, Take 1,000 mg by mouth See admin instructions. Take 1000mg  twice a day prior to dental procedures., Disp: , Rfl:  .  diphenhydrAMINE HCl, Sleep, 25 MG CAPS, Take 25 mg by mouth at bedtime as needed (sleep). Uses generic diphenhydramine or ZZZQuil., Disp: , Rfl:  .  famciclovir (FAMVIR) 500 MG tablet, Take 1 tablet (500 mg total) by mouth daily., Disp: 30 tablet, Rfl: 5 .  fluticasone (FLOVENT HFA) 110 MCG/ACT inhaler, Inhale 2 puffs into the lungs in the morning and at bedtime., Disp: 12 g, Rfl: 0 .  HYDROcodone-homatropine (HYCODAN) 5-1.5 MG/5ML syrup, Take 5 mLs by mouth every 6 (six) hours as needed for cough., Disp: 120 mL, Rfl: 0 .  Melatonin 3 MG CAPS, Take 3 mg by mouth at bedtime as needed (sleep). , Disp: , Rfl:  .  ondansetron (ZOFRAN) 4 MG tablet, Take 1-2 tablets (4-8 mg total) by mouth every 8 (eight) hours as needed for nausea (not responsive to prochlorperazine  (COMPAZINE))., Disp: 20 tablet, Rfl: 0 .  prochlorperazine (COMPAZINE) 10 MG tablet, Take 1 tablet (10 mg total) by mouth every 6 (six) hours as needed for nausea or vomiting., Disp: 30 tablet, Rfl: 0 .  Sodium Chloride-Sodium Bicarb (SODIUM BICARBONATE/SODIUM CHLORIDE) SOLN, 1 application by Mouth Rinse route every 4 (four) hours., Disp: , Rfl:  .  tadalafil (CIALIS) 10 MG tablet, Take 1 tablet (10 mg total) by mouth daily as needed for erectile dysfunction., Disp: 10 tablet, Rfl: 11 .  antiseptic oral rinse (BIOTENE) LIQD, 15 mLs by Mouth Rinse route every 4 (four) hours. (Patient not taking: Reported on 05/26/2020), Disp: 1000 mL, Rfl: 3 .  ciprofloxacin (CIPRO) 500 MG tablet, Take 1 tablet (500 mg total) by mouth daily with breakfast., Disp: 30 tablet, Rfl: 5  Allergies: No Known Allergies  Past Medical History, Surgical history, Social history, and Family History were reviewed and updated.  Review of Systems: Review of Systems  Constitutional: Positive for unexpected weight change.  HENT:   Positive for mouth sores.   Eyes: Negative.   Respiratory: Positive for cough.   Cardiovascular: Negative.   Gastrointestinal: Negative.   Endocrine: Negative.   Genitourinary: Negative.    Musculoskeletal: Negative.   Skin: Negative.   Neurological: Negative.   Hematological: Negative.   Psychiatric/Behavioral: Negative.     Physical Exam:  weight is 189 lb 8 oz (86 kg). His oral temperature is 98.7 F (37.1 C). His blood pressure is 130/68 and his pulse is 101 (abnormal). His respiration is 16 and oxygen saturation is 100%.   Wt Readings from Last 3 Encounters:  06/10/20 189 lb 8 oz (86 kg)  05/26/20 189 lb (85.7 kg)  05/16/20 188 lb (85.3 kg)    Physical Exam Vitals reviewed.  HENT:     Head: Normocephalic and atraumatic.  Eyes:     Pupils: Pupils are equal, round, and reactive to light.  Cardiovascular:     Rate and Rhythm: Normal rate and regular rhythm.     Heart sounds:  Normal heart sounds.  Pulmonary:     Effort: Pulmonary effort is normal.     Breath sounds: Normal breath sounds.     Comments: Pulmonary exam shows good breath sounds on the right side.  He has better breath sounds on the left side although still decreased.  I hear no wheezes, rales or rhonchi bilaterally. Abdominal:     General: Bowel sounds are normal.     Palpations: Abdomen is soft.  Musculoskeletal:        General: No tenderness or deformity. Normal range of motion.     Cervical back: Normal range of motion.  Lymphadenopathy:     Cervical: No cervical adenopathy.  Skin:    General: Skin is warm and dry.     Findings: No erythema or rash.  Neurological:     Mental Status: He is alert and oriented to person, place, and time.  Psychiatric:        Behavior: Behavior normal.        Thought Content: Thought content normal.        Judgment: Judgment normal.    Lab Results  Component Value Date   WBC 42.6 (H) 06/10/2020   HGB 10.0 (L) 06/10/2020   HCT 31.1 (L) 06/10/2020   MCV 87.4 06/10/2020   PLT 189 06/10/2020     Chemistry      Component Value Date/Time   NA 139 06/10/2020 0954   K 3.5 06/10/2020 0954   CL 104 06/10/2020 0954   CO2 28 06/10/2020 0954   BUN 13 06/10/2020 0954   CREATININE 1.06 06/10/2020 0954      Component Value Date/Time   CALCIUM 9.2 06/10/2020 0954   ALKPHOS 130 (H) 06/10/2020 0954   AST 20 06/10/2020 0954   ALT 18 06/10/2020 0954   BILITOT 0.2 (L) 06/10/2020 0954      Impression and Plan: Travis Duran is a really nice 44 year old white male.  He presented with a massive mediastinal tumor.  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  This is a B-cell lymphoma.  Everything looks great.  For right now, we will plan for his third cycle of chemotherapy to start on Monday, 15 November.  I will then plan for a PET scan the week after Thanksgiving.  I think the real question is whether  or not he is going to need any radiation therapy.  It would not surprise me given the size of the tumor that he presented with.  I still think this would be bulky disease.  We will plan to get him back to see [place] in the office on December 1.  This will be right before his fourth cycle of treatment which will be in the hospital on December 6.  Volanda Napoleon, MD 11/9/202111:29 AM

## 2020-06-10 NOTE — Progress Notes (Signed)
Patient here for follow up. Planned admission on 06/16/20 for next cycle of [place]. Covid screen scheduled at [facility] per patient preference. Appointment didn't get scheduled until after patient left, so will send a My Chart message.  Admission will be scheduled tomorrow.  Oncology Nurse Navigator Documentation  Oncology Nurse Navigator Flowsheets 06/10/2020  Abnormal Finding Date -  Confirmed Diagnosis Date -  Diagnosis Status -  Planned Course of Treatment -  Phase of Treatment -  Chemotherapy Actual Start Date: -  Navigator Follow Up Date: 06/16/2020  Navigator Follow Up Reason: Other:  Production assistant, radio Encounter Type Treatment;Telephone;MyChart  Telephone Appt Confirmation/Clarification;Outgoing Call  Treatment Initiated Date -  Patient Visit Type MedOnc  Treatment Phase Active Tx  Barriers/Navigation Needs Coordination of Care;Education  Education Other  Interventions Coordination of Care;Education;Psycho-Social Support  Acuity Level 3-Moderate Needs (3-4 Barriers Identified)  Coordination of Care Appts;Other  Education Method Verbal;Written  Support Groups/Services Friends and Family  Time Spent with Patient 73

## 2020-06-11 ENCOUNTER — Telehealth: Payer: Self-pay

## 2020-06-11 NOTE — Telephone Encounter (Signed)
Called pt and he is aware of his 07/02/20 appts per 11/9 los,,,AOM

## 2020-06-13 ENCOUNTER — Other Ambulatory Visit: Payer: Self-pay

## 2020-06-13 ENCOUNTER — Other Ambulatory Visit
Admission: RE | Admit: 2020-06-13 | Discharge: 2020-06-13 | Disposition: A | Discharge status: Home / Self Care | Payer: BC Managed Care – PPO | Source: Ambulatory Visit | Attending: Hematology & Oncology | Admitting: Hematology & Oncology

## 2020-06-13 ENCOUNTER — Encounter: Payer: Self-pay | Admitting: *Deleted

## 2020-06-13 DIAGNOSIS — Z20822 Contact with and (suspected) exposure to covid-19: Secondary | ICD-10-CM | POA: Insufficient documentation

## 2020-06-13 DIAGNOSIS — Z01812 Encounter for preprocedural laboratory examination: Secondary | ICD-10-CM | POA: Insufficient documentation

## 2020-06-13 NOTE — Progress Notes (Signed)
Oncology Nurse Navigator Documentation  Oncology Nurse Navigator Flowsheets 06/13/2020  Abnormal Finding Date -  Confirmed Diagnosis Date -  Diagnosis Status -  Planned Course of Treatment -  Phase of Treatment -  Chemotherapy Actual Start Date: -  Navigator Follow Up Date: 06/16/2020  Navigator Follow Up Reason: Other:  Production assistant, radio Encounter Type MyChart  Telephone FMLA/Disability  Treatment Initiated Date -  Patient Visit Type MedOnc  Treatment Phase Active Tx  Barriers/Navigation Needs Coordination of Care;Education  Education -  Interventions Disability/FMLA  Acuity Level 3-Moderate Needs (3-4 Barriers Identified)  Coordination of Care Other  Education Method -  Support Groups/Services Friends and Family  Time Spent with Patient 18

## 2020-06-14 LAB — SARS CORONAVIRUS 2 (TAT 6-24 HRS): SARS Coronavirus 2: NEGATIVE

## 2020-06-15 ENCOUNTER — Other Ambulatory Visit: Payer: Self-pay | Admitting: Hematology & Oncology

## 2020-06-15 DIAGNOSIS — C8582 Other specified types of non-Hodgkin lymphoma, intrathoracic lymph nodes: Secondary | ICD-10-CM

## 2020-06-16 ENCOUNTER — Other Ambulatory Visit: Payer: Self-pay

## 2020-06-16 ENCOUNTER — Encounter (HOSPITAL_COMMUNITY): Payer: Self-pay | Admitting: Hematology & Oncology

## 2020-06-16 ENCOUNTER — Inpatient Hospital Stay (HOSPITAL_COMMUNITY): Payer: BC Managed Care – PPO

## 2020-06-16 ENCOUNTER — Inpatient Hospital Stay (HOSPITAL_COMMUNITY)
Admission: AD | Admit: 2020-06-16 | Discharge: 2020-06-20 | DRG: 847 | Disposition: A | Discharge status: Home / Self Care | Payer: BC Managed Care – PPO | Attending: Hematology & Oncology | Admitting: Hematology & Oncology

## 2020-06-16 DIAGNOSIS — Z5111 Encounter for antineoplastic chemotherapy: Principal | ICD-10-CM

## 2020-06-16 DIAGNOSIS — C8332 Diffuse large B-cell lymphoma, intrathoracic lymph nodes: Secondary | ICD-10-CM

## 2020-06-16 DIAGNOSIS — T451X5A Adverse effect of antineoplastic and immunosuppressive drugs, initial encounter: Secondary | ICD-10-CM | POA: Diagnosis not present

## 2020-06-16 DIAGNOSIS — D75839 Thrombocytosis, unspecified: Secondary | ICD-10-CM | POA: Diagnosis not present

## 2020-06-16 DIAGNOSIS — C8582 Other specified types of non-Hodgkin lymphoma, intrathoracic lymph nodes: Secondary | ICD-10-CM | POA: Diagnosis not present

## 2020-06-16 DIAGNOSIS — C8330 Diffuse large B-cell lymphoma, unspecified site: Secondary | ICD-10-CM | POA: Diagnosis present

## 2020-06-16 DIAGNOSIS — J910 Malignant pleural effusion: Secondary | ICD-10-CM | POA: Diagnosis present

## 2020-06-16 DIAGNOSIS — Y92230 Patient room in hospital as the place of occurrence of the external cause: Secondary | ICD-10-CM | POA: Diagnosis not present

## 2020-06-16 DIAGNOSIS — D72829 Elevated white blood cell count, unspecified: Secondary | ICD-10-CM | POA: Diagnosis not present

## 2020-06-16 DIAGNOSIS — D6481 Anemia due to antineoplastic chemotherapy: Secondary | ICD-10-CM | POA: Diagnosis not present

## 2020-06-16 DIAGNOSIS — T380X5A Adverse effect of glucocorticoids and synthetic analogues, initial encounter: Secondary | ICD-10-CM | POA: Diagnosis not present

## 2020-06-16 DIAGNOSIS — C8520 Mediastinal (thymic) large B-cell lymphoma, unspecified site: Secondary | ICD-10-CM | POA: Diagnosis present

## 2020-06-16 LAB — MAGNESIUM: Magnesium: 1.8 mg/dL (ref 1.7–2.4)

## 2020-06-16 LAB — COMPREHENSIVE METABOLIC PANEL
ALT: 31 U/L (ref 0–44)
AST: 20 U/L (ref 15–41)
Albumin: 3.5 g/dL (ref 3.5–5.0)
Alkaline Phosphatase: 111 U/L (ref 38–126)
Anion gap: 5 (ref 5–15)
BUN: 15 mg/dL (ref 6–20)
CO2: 28 mmol/L (ref 22–32)
Calcium: 8.8 mg/dL — ABNORMAL LOW (ref 8.9–10.3)
Chloride: 105 mmol/L (ref 98–111)
Creatinine, Ser: 1.12 mg/dL (ref 0.61–1.24)
GFR, Estimated: >60 mL/min (ref >60)
Glucose, Bld: 111 mg/dL — ABNORMAL HIGH (ref 70–99)
Potassium: 3.5 mmol/L (ref 3.5–5.1)
Sodium: 138 mmol/L (ref 135–145)
Total Bilirubin: 0.2 mg/dL — ABNORMAL LOW (ref 0.3–1.2)
Total Protein: 6.4 g/dL — ABNORMAL LOW (ref 6.5–8.1)

## 2020-06-16 LAB — CBC
HCT: 31.9 % — ABNORMAL LOW (ref 39.0–52.0)
Hemoglobin: 10.3 g/dL — ABNORMAL LOW (ref 13.0–17.0)
MCH: 28.5 pg (ref 26.0–34.0)
MCHC: 32.3 g/dL (ref 30.0–36.0)
MCV: 88.1 fL (ref 80.0–100.0)
Platelets: 232 10*3/uL (ref 150–400)
RBC: 3.62 MIL/uL — ABNORMAL LOW (ref 4.22–5.81)
RDW: 17.4 % — ABNORMAL HIGH (ref 11.5–15.5)
WBC: 23.5 10*3/uL — ABNORMAL HIGH (ref 4.0–10.5)
nRBC: 0.6 % — ABNORMAL HIGH (ref 0.0–0.2)

## 2020-06-16 LAB — PHOSPHORUS: Phosphorus: 3.7 mg/dL (ref 2.5–4.6)

## 2020-06-16 LAB — URINALYSIS, ROUTINE W REFLEX MICROSCOPIC
Bilirubin Urine: NEGATIVE
Glucose, UA: NEGATIVE mg/dL
Hgb urine dipstick: NEGATIVE
Ketones, ur: NEGATIVE mg/dL
Leukocytes,Ua: NEGATIVE
Nitrite: NEGATIVE
Protein, ur: NEGATIVE mg/dL
Specific Gravity, Urine: 1.014 (ref 1.005–1.030)
pH: 6 (ref 5.0–8.0)

## 2020-06-16 MED ORDER — SODIUM BICARBONATE/SODIUM CHLORIDE MOUTHWASH
1.0000 "application " | OROMUCOSAL | Status: DC
  Administered 2020-06-16 – 2020-06-20 (×19): 1 via OROMUCOSAL
  Filled 2020-06-16: qty 1000

## 2020-06-16 MED ORDER — PROCHLORPERAZINE MALEATE 10 MG PO TABS: 10.0000 mg | ORAL_TABLET | Freq: Four times a day (QID) | ORAL | Status: DC | PRN

## 2020-06-16 MED ORDER — CHLORHEXIDINE GLUCONATE CLOTH 2 % EX PADS
6.0000 | MEDICATED_PAD | Freq: Every day | CUTANEOUS | Status: DC
  Administered 2020-06-17 – 2020-06-20 (×4): 6 via TOPICAL

## 2020-06-16 MED ORDER — PREDNISONE 20 MG PO TABS
60.0000 mg | ORAL_TABLET | Freq: Every day | ORAL | Status: AC
  Administered 2020-06-16 – 2020-06-20 (×5): 60 mg via ORAL
  Filled 2020-06-16 (×4): qty 3

## 2020-06-16 MED ORDER — FAMCICLOVIR 500 MG PO TABS
500.0000 mg | ORAL_TABLET | Freq: Every day | ORAL | Status: DC
  Administered 2020-06-17 – 2020-06-20 (×4): 500 mg via ORAL
  Filled 2020-06-16 (×4): qty 1

## 2020-06-16 MED ORDER — ENOXAPARIN SODIUM 40 MG/0.4ML ~~LOC~~ SOLN
40.0000 mg | SUBCUTANEOUS | Status: DC
  Filled 2020-06-16 (×3): qty 0.4

## 2020-06-16 MED ORDER — MELATONIN 3 MG PO TABS: 3.0000 mg | ORAL_TABLET | Freq: Every evening | ORAL | Status: DC | PRN

## 2020-06-16 MED ORDER — DIPHENHYDRAMINE HCL (SLEEP) 25 MG PO CAPS: 25.0000 mg | ORAL_CAPSULE | Freq: Every evening | ORAL | Status: DC | PRN

## 2020-06-16 MED ORDER — ONDANSETRON HCL 4 MG PO TABS: 4.0000 mg | ORAL_TABLET | Freq: Three times a day (TID) | ORAL | Status: DC | PRN

## 2020-06-16 MED ORDER — POTASSIUM CHLORIDE IN NACL 20-0.9 MEQ/L-% IV SOLN
INTRAVENOUS | Status: DC
  Filled 2020-06-16 (×5): qty 1000

## 2020-06-16 MED ORDER — SODIUM CHLORIDE 0.9 % IV SOLN: INTRAVENOUS | Status: DC

## 2020-06-16 MED ORDER — VINCRISTINE SULFATE CHEMO INJECTION 1 MG/ML
Freq: Once | INTRAVENOUS | Status: AC
  Filled 2020-06-16: qty 11

## 2020-06-16 MED ORDER — DIPHENHYDRAMINE HCL 25 MG PO CAPS: 25.0000 mg | ORAL_CAPSULE | Freq: Every evening | ORAL | Status: DC | PRN

## 2020-06-16 MED ORDER — DOCUSATE SODIUM 100 MG PO CAPS: 100.0000 mg | ORAL_CAPSULE | Freq: Two times a day (BID) | ORAL | Status: DC | PRN

## 2020-06-16 MED ORDER — SODIUM CHLORIDE 0.9 % IV SOLN
Freq: Once | INTRAVENOUS | Status: AC
  Administered 2020-06-16: 18 mg via INTRAVENOUS
  Filled 2020-06-16: qty 4

## 2020-06-16 MED ORDER — HYDROCODONE-HOMATROPINE 5-1.5 MG/5ML PO SYRP: 5.0000 mL | ORAL_SOLUTION | Freq: Four times a day (QID) | ORAL | Status: DC | PRN

## 2020-06-16 NOTE — H&P (Addendum)
[place]  [phone] Fax:([phone] [place]  Reason for Admission: Cycle #3 EPOCH-R for primary mediastinal B-cell lymphoma  HPI: Mr. Delahoussaye is a 44 year old male who was seen for initial consult during his hospitalization on 04/25/2020.  The patient had progressive shortness of breath over a 6-week period of time and a CT of the chest performed on 04/24/2020 showed a large malignant appearing left anterior mediastinal mass measuring 14.6 x 11.7 x 18.5 cm, large left pleural effusion, and small pericardial effusion.  He underwent several thoracenteses during his hospitalization as well as a CT-guided biopsy of the mediastinal mass.  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  Echocardiogram performed on 04/27/2020 showed a LVEF of 60 to 65%.  We were unable to perform a PET scan prior to his first dose of chemotherapy, but he had a PET scan performed on 05/15/2020 following cycle #1.  This showed a considerable reduction in size of the anterior mediastinal mass in the left pleural tumor deposits.  There was splenic activity and marrow activity likely due to G-CSF.  The patient presents to the hospital today for admission for cycle #3 of chemotherapy.  He is feeling well overall.  He has not had any recent fevers or chills.  His cough and shortness of breath continue to improve.  He has no longer using Hycodan. Denies mucositis.  Denies chest pain.  Denies abdominal pain, nausea, vomiting, constipation, diarrhea.  No bleeding reported.  The patient is seen today for admission for cycle #3 of EPOCH-R.      Past Medical History:  Diagnosis Date  . Arthritis of right knee   . ED (erectile dysfunction)   . Lymphoma, large cell, intrathoracic lymph nodes ([place]) 05/01/2020  . OSA (obstructive sleep apnea) 2007    with AHI 32/hr                      . IR IMAGING GUIDED PORT INSERTION  05/07/2020  . IR THORACENTESIS ASP PLEURAL SPACE W/IMG GUIDE  04/25/2020  . IR THORACENTESIS ASP PLEURAL SPACE W/IMG GUIDE  05/07/2020  . REFRACTIVE SURGERY  09/2012   bilateral   . THORACENTESIS  04/25/2020   Procedure: THORACENTESIS;  Surgeon: Candee Furbish, MD;  Location: [facility] ENDOSCOPY;  Service: Pulmonary;;  . VIDEO BRONCHOSCOPY WITH ENDOBRONCHIAL ULTRASOUND N/A 04/25/2020   Procedure: VIDEO BRONCHOSCOPY WITH ENDOBRONCHIAL ULTRASOUND;  Surgeon: Candee Furbish, MD;  Location: [facility] ENDOSCOPY;  Service: Pulmonary;  Laterality: N/A;         Family History  Problem Relation Age of Onset  . Sleep apnea Other   . Hypertension Other   . Emphysema Father   . Hypertension Father   Relation Age of Onset  . Sleep apnea Other   . Hypertension Other   . Emphysema Father   . Hypertension Father      Socioeconomic History  . Marital status: Married    Spouse name: Not on file  . Number of children: 0  . Years of education: Not on file  . Highest education level: Not on file  Occupational History  . Not on file  Tobacco Use  . Smoking status: Never Smoker  . Smokeless tobacco: Never Used  Vaping Use  . Vaping Use: Never used  Substance and Sexual Activity  . Alcohol use: Yes    Alcohol/week:  0.0 standard drinks    Comment: occ  . Drug use: No  . Sexual activity: Yes    Partners: Female  Other Topics Concern  . Not on file  Social History Narrative  . Not on file   Social Determinants of Health   Financial Resource Strain:   . Difficulty of Paying Living Expenses: Not on file  Food Insecurity:   . Worried About Charity fundraiser in the Last Year: Not on file  . Ran Out of Food in the Last Year: Not on file  Transportation Needs:   . Lack of Transportation (Medical): Not on file  . Lack of Transportation (Non-Medical): Not on file  Physical Activity:   . Days of Exercise per Week: Not on file  .  Minutes of Exercise per Session: Not on file  Stress:   . Feeling of Stress : Not on file  Social Connections:   . Frequency of Communication with Friends and Family: Not on file  . Frequency of Social Gatherings with Friends and Family: Not on file  . Attends Religious Services: Not on file  . Active Member of Clubs or Organizations: Not on file  . Attends Archivist Meetings: Not on file  . Marital Status: Not on file  Intimate Partner Violence:   . Fear of Current or Ex-Partner: Not on file  . Emotionally Abused: Not on file  . Physically Abused: Not on file  . Sexually Abused: Not on file  :  Review of Systems: A comprehensive 14 point review of systems was negative except as noted in the HPI.  Exam: Patient Vitals for the past 24 hrs:  BP Temp Temp src Pulse Resp SpO2 Height Weight  06/16/20 0823 114/86 98.2 F (36.8 C) Oral (!) 107 19 100 % 5\' 9"  (1.753 m) 84.4 kg    General:  well-nourished in no acute distress.   Eyes:  no scleral icterus.   ENT:  There were no oropharyngeal lesions.   Neck was without thyromegaly.   Lymphatics:  Negative cervical, supraclavicular or axillary adenopathy.   Respiratory: Clear to auscultation bilaterally Cardiovascular:  Regular rate and rhythm, S1/S2, without murmur, rub or gallop.  There was no pedal edema.   GI:  abdomen was soft, flat, nontender, nondistended, without organomegaly.   Musculoskeletal: Strength symmetrical in the upper and lower extremities. Skin exam was without echymosis, petichae.   Neuro exam was nonfocal. Patient was alert and oriented.  Attention was good.   Language was appropriate.  Mood was normal without depression.  Speech was not pressured.  Thought content was not tangential.     Lab Results  Component Value Date   WBC 42.6 (H) 06/10/2020   HGB 10.0 (L) 06/10/2020   HCT 31.1 (L) 06/10/2020   PLT 189 06/10/2020   GLUCOSE 116 (H) 06/10/2020   CHOL 170 04/06/2019   TRIG 58.0 04/06/2019   HDL  43.20 04/06/2019   LDLCALC 115 (H) 04/06/2019   ALT 18 06/10/2020   AST 20 06/10/2020   NA 139 06/10/2020   K 3.5 06/10/2020   CL 104 06/10/2020   CREATININE 1.06 06/10/2020   BUN 13 06/10/2020   CO2 28 06/10/2020    X-ray chest PA and lateral  Result Date: 05/26/2020 CLINICAL DATA:  History of mediastinal mass with ongoing cough. EXAM: CHEST - 2 VIEW COMPARISON:  PET-CT 05/15/2020 FINDINGS: Right chest wall port a catheter noted with tip at the cavoatrial junction. Heart size appears within normal limits.  Anterior mediastinal mass has also decreased in size in the interval. Significant interval reduction in volume of left pleural effusion with improved aeration to the left midlung and left base. Residual subsegmental atelectasis noted in the left base. The visualized skeletal structures are unremarkable. IMPRESSION: 1. Interval decrease in size of anterior mediastinal mass. 2. Significant decrease in volume of left pleural effusion. 3. Mild residual subsegmental atelectasis noted in the left base. Electronically Signed   By: Kerby Moors M.D.   On: 05/26/2020 11:37     X-ray chest PA and lateral  Result Date: 05/26/2020 CLINICAL DATA:  History of mediastinal mass with ongoing cough. EXAM: CHEST - 2 VIEW COMPARISON:  PET-CT 05/15/2020 FINDINGS: Right chest wall port a catheter noted with tip at the cavoatrial junction. Heart size appears within normal limits. Anterior mediastinal mass has also decreased in size in the interval. Significant interval reduction in volume of left pleural effusion with improved aeration to the left midlung and left base. Residual subsegmental atelectasis noted in the left base. The visualized skeletal structures are unremarkable. IMPRESSION: 1. Interval decrease in size of anterior mediastinal mass. 2. Significant decrease in volume of left pleural effusion. 3. Mild residual subsegmental atelectasis noted in the left base. Electronically Signed   By: Kerby Moors M.D.   On: 05/26/2020 11:37   Assessment and Plan:  1.  Primary mediastinal B-cell lymphoma 2.  Pleural effusion secondary to #1, improved 3.  Cough due to #1 and #2, improved  -Admit to inpatient oncology unit.  Plan to proceed with cycle #3 of systemic chemotherapy as planned.  He is aware of the possible adverse effects and is agreeable to proceed.  We will review labs prior to chemotherapy administration. -Regular diet. -Lovenox for DVT prophylaxis. -Continue home medications. -The patient has antiemetics and stool softeners available to him.  Mikey Bussing, DNP, AGPCNP-BC, AOCNP  ADDENDUM: I agree with the above assessment.  By his chest x-ray, things are still responding to treatment.  He continues having resolution of this anterior mediastinal mass.  There is really no significant pleural effusion at this point.  He has minimal atelectasis in the left base.  After this cycle of treatment, we will then repeat another PET scan and see how everything looks.  So far he has had very few complications of treatment.  His white cell count is elevated because of the Neulasta.  We will go ahead and get his treatment started today.  Hopefully, we will be able to get him out on Thursday.  I know that the staff up on 6 [address]. will do a fantastic job with him.  The staff always does a wonderful job with all of our patients.  Lattie Haw, MD

## 2020-06-17 ENCOUNTER — Encounter: Payer: Self-pay | Admitting: *Deleted

## 2020-06-17 MED ORDER — VINCRISTINE SULFATE CHEMO INJECTION 1 MG/ML
Freq: Once | INTRAVENOUS | Status: AC
  Filled 2020-06-17: qty 11

## 2020-06-17 MED ORDER — SODIUM CHLORIDE 0.9 % IV SOLN
Freq: Once | INTRAVENOUS | Status: AC
  Administered 2020-06-17: 18 mg via INTRAVENOUS
  Filled 2020-06-17: qty 4

## 2020-06-17 NOTE — Progress Notes (Signed)
Calculation and dosage of doxorubicin, etoposide and vincristine verified by Aldean Baker, RN

## 2020-06-17 NOTE — Progress Notes (Signed)
[name] is doing okay.  He started chemotherapy yesterday.  He has had no problems with chemotherapy.  His chest x-ray looked great.  Really is not much of the anterior mediastinal mass.  He has had no cough.  There is no shortness of breath.  He has had no problem with mouth sores.  There is no fever.  He has had no diarrhea.  There is been no rashes.  On his physical exam, his temperature is 97.5.  Pulse 82.  Blood pressure 108/74.  Oral exam shows no mucositis.  Lungs are clear bilaterally.  Cardiac exam regular rate and rhythm with no murmurs, rubs or bruits.  Abdomen is soft.  He has good bowel sounds.  There is no fluid wave.  There is no palpable liver or spleen tip.  Extremities shows no clubbing, cyanosis or edema.  Skin exam shows no rashes.  Neurological exam is nonfocal.  We will continue him on chemotherapy for his mediastinal lymphoma.  Again is responding clinically by the chest x-ray.  I appreciate the great care that he is getting from the staff on 6 [address].  Lattie Haw, MD  Michaelyn Barter 6:10

## 2020-06-17 NOTE — Progress Notes (Signed)
Ok to give [facility] today without labs today per Dr Marin Olp.

## 2020-06-18 LAB — COMPREHENSIVE METABOLIC PANEL
ALT: 28 U/L (ref 0–44)
AST: 19 U/L (ref 15–41)
Albumin: 3.4 g/dL — ABNORMAL LOW (ref 3.5–5.0)
Alkaline Phosphatase: 101 U/L (ref 38–126)
Anion gap: 6 (ref 5–15)
BUN: 15 mg/dL (ref 6–20)
CO2: 22 mmol/L (ref 22–32)
Calcium: 8.9 mg/dL (ref 8.9–10.3)
Chloride: 108 mmol/L (ref 98–111)
Creatinine, Ser: 0.82 mg/dL (ref 0.61–1.24)
GFR, Estimated: >60 mL/min (ref >60)
Glucose, Bld: 124 mg/dL — ABNORMAL HIGH (ref 70–99)
Potassium: 3.6 mmol/L (ref 3.5–5.1)
Sodium: 136 mmol/L (ref 135–145)
Total Bilirubin: 0.4 mg/dL (ref 0.3–1.2)
Total Protein: 5.9 g/dL — ABNORMAL LOW (ref 6.5–8.1)

## 2020-06-18 LAB — CBC WITH DIFFERENTIAL/PLATELET
Abs Immature Granulocytes: 1.12 10*3/uL — ABNORMAL HIGH (ref 0.00–0.07)
Basophils Absolute: 0.1 10*3/uL (ref 0.0–0.1)
Basophils Relative: 0 %
Eosinophils Absolute: 0 10*3/uL (ref 0.0–0.5)
Eosinophils Relative: 0 %
HCT: 29.6 % — ABNORMAL LOW (ref 39.0–52.0)
Hemoglobin: 9.6 g/dL — ABNORMAL LOW (ref 13.0–17.0)
Immature Granulocytes: 4 %
Lymphocytes Relative: 2 %
Lymphs Abs: 0.6 10*3/uL — ABNORMAL LOW (ref 0.7–4.0)
MCH: 28.5 pg (ref 26.0–34.0)
MCHC: 32.4 g/dL (ref 30.0–36.0)
MCV: 87.8 fL (ref 80.0–100.0)
Monocytes Absolute: 1.5 10*3/uL — ABNORMAL HIGH (ref 0.1–1.0)
Monocytes Relative: 5 %
Neutro Abs: 24.3 10*3/uL — ABNORMAL HIGH (ref 1.7–7.7)
Neutrophils Relative %: 89 %
Platelets: 318 10*3/uL (ref 150–400)
RBC: 3.37 MIL/uL — ABNORMAL LOW (ref 4.22–5.81)
RDW: 17.6 % — ABNORMAL HIGH (ref 11.5–15.5)
WBC: 27.6 10*3/uL — ABNORMAL HIGH (ref 4.0–10.5)
nRBC: 0.1 % (ref 0.0–0.2)

## 2020-06-18 LAB — LACTATE DEHYDROGENASE: LDH: 210 U/L — ABNORMAL HIGH (ref 98–192)

## 2020-06-18 MED ORDER — VINCRISTINE SULFATE CHEMO INJECTION 1 MG/ML
Freq: Once | INTRAVENOUS | Status: AC
  Filled 2020-06-18: qty 11

## 2020-06-18 MED ORDER — SODIUM CHLORIDE 0.9 % IV SOLN
Freq: Once | INTRAVENOUS | Status: AC
  Administered 2020-06-18: 8 mg via INTRAVENOUS
  Filled 2020-06-18: qty 4

## 2020-06-18 NOTE — Progress Notes (Signed)
Travis Duran is doing well.  So far, chemotherapy is going nicely.  He has had no problems with chemotherapy.  He has been active.  Been walking.  He really does not want the Lovenox injections.  He is staying well-hydrated.  There is no bleeding.  There is no cough or shortness of breath.  He has had no diarrhea.  He has had no mouth sores.  He has had no rashes.  His labs show white cell count 27.6.  Hemoglobin 9.6.  Platelet count 318,000.  His BUN is 15 creatinine 0.82.  Blood sugar is 124.  Albumin is 3.4.  On his physical exam, his temperature is 97.4.  Pulse 74.  Blood pressure 119/83.  Oral exam shows no mucositis.  There is no adenopathy on the neck.  Lungs are clear bilaterally.  No wheezes or rales are noted.  Cardiac exam regular rate and rhythm.  He has no murmurs.  Abdomen is soft.  There is no distention.  There is no guarding or rebound tenderness.  He has good bowel sounds.  There is no splenomegaly.  Extremities shows no clubbing, cyanosis or edema.  Neurological exam is nonfocal.  Mr. Zimbelman will continue his chemotherapy.  He is doing well with it so far.  He certainly is responding nicely by the chest x-ray at least.  He is due for his PET scan after this cycle of treatment.  I very much appreciate the outstanding care that he is getting from all the staff up on 6 [address].  Lattie Haw, MD  Lurena Joiner 1:37

## 2020-06-18 NOTE — Progress Notes (Signed)
Chemotherapy dosages and calculations verified with Aldean Baker, RN.

## 2020-06-18 NOTE — Progress Notes (Signed)
Chaplain engaged in an initial visit with Travis Duran.  Chaplain introduced herself and explained her role.  Chaplain was able to learn that Travis Duran is married and lives around the [place] area in [place].  He stated that his wife would be by later today.  He also noted that he was going into his third round of chemotherapy.  Chaplain offered support and will follow-up as needed.     06/18/20 1000  Clinical Encounter Type  Visited With Patient  Visit Type Initial  Referral From Nurse  Consult/Referral To Chaplain

## 2020-06-19 MED ORDER — SODIUM CHLORIDE 0.9 % IV SOLN
Freq: Once | INTRAVENOUS | Status: AC
  Administered 2020-06-19: 8 mg via INTRAVENOUS
  Filled 2020-06-19: qty 4

## 2020-06-19 MED ORDER — VINCRISTINE SULFATE CHEMO INJECTION 1 MG/ML
Freq: Once | INTRAVENOUS | Status: AC
  Filled 2020-06-19: qty 11

## 2020-06-19 NOTE — Progress Notes (Signed)
Travis Duran is doing quite well.  He has had no problems with the chemotherapy today.  He is active.  He is walking around.  He is appetite is good.  He has had no nausea or vomiting.  He has had no problems with diarrhea.  There has been no issues with mouth sores.  He has had no bleeding.  He has had no cough or shortness of breath.  He has had no rashes.  He will have 1 more day of treatment after today.  He gets the Rituxan on Friday.  His vital signs are temperature 97.7.  Pulse 80.  Blood pressure 138/109.  His head neck exam shows no oral lesions.  He has no adenopathy in the neck.  Lungs are relatively clear bilaterally.  He may have some crackles over on the left side.  He has good air movement bilaterally.  Cardiac exam regular rate and rhythm.  Abdomen is soft.  He has good bowel sounds.  There is no fluid wave.  Extremities shows no clubbing, cyanosis or edema.  He has good range of motion of his joints.  Skin exam shows no rashes, ecchymoses or petechia.  Neurological exam is nonfocal.  Mr. Vandegrift has a mediastinal large cell lymphoma.  This is his third cycle of chemotherapy.  After this cycle, we will do the PET scan on him to see how he is responded.  Hopefully, if everything goes well, and we will be able to get him home tomorrow.  The staff on 6[address] doing a fantastic job taking care of him.  Lattie Haw, MD  Jeneen Rinks 4:10

## 2020-06-20 ENCOUNTER — Other Ambulatory Visit: Payer: Self-pay | Admitting: Family

## 2020-06-20 ENCOUNTER — Encounter: Payer: Self-pay | Admitting: *Deleted

## 2020-06-20 DIAGNOSIS — C8582 Other specified types of non-Hodgkin lymphoma, intrathoracic lymph nodes: Secondary | ICD-10-CM

## 2020-06-20 LAB — CBC WITH DIFFERENTIAL/PLATELET
Abs Immature Granulocytes: 0.09 10*3/uL — ABNORMAL HIGH (ref 0.00–0.07)
Basophils Absolute: 0 10*3/uL (ref 0.0–0.1)
Basophils Relative: 0 %
Eosinophils Absolute: 0 10*3/uL (ref 0.0–0.5)
Eosinophils Relative: 0 %
HCT: 29 % — ABNORMAL LOW (ref 39.0–52.0)
Hemoglobin: 9.9 g/dL — ABNORMAL LOW (ref 13.0–17.0)
Immature Granulocytes: 1 %
Lymphocytes Relative: 5 %
Lymphs Abs: 0.4 10*3/uL — ABNORMAL LOW (ref 0.7–4.0)
MCH: 29 pg (ref 26.0–34.0)
MCHC: 34.1 g/dL (ref 30.0–36.0)
MCV: 85 fL (ref 80.0–100.0)
Monocytes Absolute: 0.3 10*3/uL (ref 0.1–1.0)
Monocytes Relative: 3 %
Neutro Abs: 8.5 10*3/uL — ABNORMAL HIGH (ref 1.7–7.7)
Neutrophils Relative %: 91 %
Platelets: 405 10*3/uL — ABNORMAL HIGH (ref 150–400)
RBC: 3.41 MIL/uL — ABNORMAL LOW (ref 4.22–5.81)
RDW: 16.8 % — ABNORMAL HIGH (ref 11.5–15.5)
WBC: 9.3 10*3/uL (ref 4.0–10.5)
nRBC: 0 % (ref 0.0–0.2)

## 2020-06-20 LAB — COMPREHENSIVE METABOLIC PANEL
ALT: 30 U/L (ref 0–44)
AST: 17 U/L (ref 15–41)
Albumin: 3.3 g/dL — ABNORMAL LOW (ref 3.5–5.0)
Alkaline Phosphatase: 83 U/L (ref 38–126)
Anion gap: 7 (ref 5–15)
BUN: 17 mg/dL (ref 6–20)
CO2: 23 mmol/L (ref 22–32)
Calcium: 9 mg/dL (ref 8.9–10.3)
Chloride: 104 mmol/L (ref 98–111)
Creatinine, Ser: 0.72 mg/dL (ref 0.61–1.24)
GFR, Estimated: >60 mL/min (ref >60)
Glucose, Bld: 125 mg/dL — ABNORMAL HIGH (ref 70–99)
Potassium: 3.5 mmol/L (ref 3.5–5.1)
Sodium: 134 mmol/L — ABNORMAL LOW (ref 135–145)
Total Bilirubin: 0.6 mg/dL (ref 0.3–1.2)
Total Protein: 5.6 g/dL — ABNORMAL LOW (ref 6.5–8.1)

## 2020-06-20 LAB — URIC ACID: Uric Acid, Serum: 4 mg/dL (ref 3.7–8.6)

## 2020-06-20 LAB — LACTATE DEHYDROGENASE: LDH: 162 U/L (ref 98–192)

## 2020-06-20 MED ORDER — SODIUM CHLORIDE 0.9 % IV SOLN
Freq: Once | INTRAVENOUS | Status: AC
  Administered 2020-06-20: 36 mg via INTRAVENOUS
  Filled 2020-06-20: qty 8

## 2020-06-20 MED ORDER — DIPHENHYDRAMINE HCL 50 MG PO CAPS
50.0000 mg | ORAL_CAPSULE | Freq: Once | ORAL | Status: AC
  Administered 2020-06-20: 50 mg via ORAL
  Filled 2020-06-20 (×2): qty 1

## 2020-06-20 MED ORDER — SODIUM CHLORIDE 0.9 % IV SOLN
750.0000 mg/m2 | Freq: Once | INTRAVENOUS | Status: AC
  Administered 2020-06-20: 1580 mg via INTRAVENOUS
  Filled 2020-06-20: qty 79

## 2020-06-20 MED ORDER — CIPROFLOXACIN HCL 500 MG PO TABS
500.0000 mg | ORAL_TABLET | Freq: Every day | ORAL | Status: DC
  Administered 2020-06-20: 500 mg via ORAL
  Filled 2020-06-20: qty 1

## 2020-06-20 MED ORDER — SODIUM CHLORIDE 0.9 % IV SOLN
375.0000 mg/m2 | Freq: Once | INTRAVENOUS | Status: AC
  Administered 2020-06-20: 800 mg via INTRAVENOUS
  Filled 2020-06-20: qty 80

## 2020-06-20 MED ORDER — FLUCONAZOLE 100 MG PO TABS: 100.0000 mg | ORAL_TABLET | Freq: Every day | ORAL | 5 refills | Status: DC

## 2020-06-20 MED ORDER — ACETAMINOPHEN 325 MG PO TABS
650.0000 mg | ORAL_TABLET | Freq: Once | ORAL | Status: AC
  Administered 2020-06-20: 650 mg via ORAL
  Filled 2020-06-20 (×2): qty 2

## 2020-06-20 MED ORDER — SODIUM CHLORIDE 0.9 % IV SOLN: Freq: Once | INTRAVENOUS | Status: DC

## 2020-06-20 MED ORDER — FLUCONAZOLE 100 MG PO TABS
100.0000 mg | ORAL_TABLET | Freq: Every day | ORAL | Status: DC
  Administered 2020-06-20: 100 mg via ORAL
  Filled 2020-06-20: qty 1

## 2020-06-20 NOTE — Discharge Summary (Addendum)
Discharge Summary  Patient ID: Travis Duran MRN: [id number] DOB/AGE: [birth year] 44 y.o.  Admit date: 06/16/2020 Discharge date: 06/20/2020  Discharge Diagnoses:  Active Problems:   Encounter for antineoplastic chemotherapy   Diffuse large B cell lymphoma ([place])   Discharged Condition: good  Discharge Labs:   CBC    Component Value Date/Time   WBC 9.3 06/20/2020 0501   RBC 3.41 (L) 06/20/2020 0501   HGB 9.9 (L) 06/20/2020 0501   HGB 10.0 (L) 06/10/2020 0954   HCT 29.0 (L) 06/20/2020 0501   PLT 405 (H) 06/20/2020 0501   PLT 189 06/10/2020 0954   MCV 85.0 06/20/2020 0501   MCH 29.0 06/20/2020 0501   MCHC 34.1 06/20/2020 0501   RDW 16.8 (H) 06/20/2020 0501   LYMPHSABS 0.4 (L) 06/20/2020 0501   MONOABS 0.3 06/20/2020 0501   EOSABS 0.0 06/20/2020 0501   BASOSABS 0.0 06/20/2020 0501   CMP Latest Ref Rng & Units 06/20/2020 06/18/2020 06/16/2020  Glucose 70 - 99 mg/dL 125(H) 124(H) 111(H)  BUN 6 - 20 mg/dL 17 15 15   Creatinine 0.61 - 1.24 mg/dL 0.72 0.82 1.12  Sodium 135 - 145 mmol/L 134(L) 136 138  Potassium 3.5 - 5.1 mmol/L 3.5 3.6 3.5  Chloride 98 - 111 mmol/L 104 108 105  CO2 22 - 32 mmol/L 23 22 28   Calcium 8.9 - 10.3 mg/dL 9.0 8.9 8.8(L)  Total Protein 6.5 - 8.1 g/dL 5.6(L) 5.9(L) 6.4(L)  Total Bilirubin 0.3 - 1.2 mg/dL 0.6 0.4 0.2(L)  Alkaline Phos 38 - 126 U/L 83 101 111  AST 15 - 41 U/L 17 19 20   ALT 0 - 44 U/L 30 28 31    Diagnostics: 1.  06/16/2020-chest x-ray which soft tissue fullness in the anterior mediastinum appreciable on the lateral view and much better delineated as a mass in the right mediastinum anteriorly on the recent PET scan, no other apparent adenopathy, lungs clear, normal heart size, Port-A-Cath tip in the cavoatrial junction.  Consults: None  Procedures:  None  Disposition:  Discharge disposition: 01-Home or Self Care      Allergies as of 06/20/2020   No Known Allergies     Medication List    TAKE these medications   antiseptic  oral rinse Liqd 15 mLs by Mouth Rinse route every 4 (four) hours.   ciprofloxacin 500 MG tablet Commonly known as: CIPRO Take 1 tablet (500 mg total) by mouth daily with breakfast.   cyclobenzaprine 10 MG tablet Commonly known as: FLEXERIL Take 10 mg by mouth 3 (three) times daily as needed for muscle pain.   famciclovir 500 MG tablet Commonly known as: FAMVIR Take 1 tablet (500 mg total) by mouth daily.   fluconazole 100 MG tablet Commonly known as: DIFLUCAN Take 1 tablet (100 mg total) by mouth daily.   HYDROcodone-homatropine 5-1.5 MG/5ML syrup Commonly known as: Hycodan Take 5 mLs by mouth every 6 (six) hours as needed for cough.   Melatonin 3 MG Caps Take 3 mg by mouth at bedtime.   ondansetron 4 MG tablet Commonly known as: ZOFRAN Take 1-2 tablets (4-8 mg total) by mouth every 8 (eight) hours as needed for nausea (not responsive to prochlorperazine (COMPAZINE)).   prochlorperazine 10 MG tablet Commonly known as: COMPAZINE Take 1 tablet (10 mg total) by mouth every 6 (six) hours as needed for nausea or vomiting.   sodium bicarbonate/sodium chloride Soln 1 application by Mouth Rinse route every 4 (four) hours.   tadalafil 10 MG tablet Commonly known as: Cialis  Take 1 tablet (10 mg total) by mouth daily as needed for erectile dysfunction.        Follow-up Information    Volanda Napoleon, MD Follow up.   Specialty: Oncology Why: Go to the office on Monday for your Neulasta injection.  Roselyn Reef will check in about the other appointments.  Have a great Thanksgiving!!!  You totally deserve it!!  [facility] information: [address] [place] [address] [phone]              HPI:  [name] is a 44 year old male who was seen for initial consult during his hospitalization on 04/25/2020.  The patient had progressive shortness of breath over a 6-week period of time and a CT of the chest performed on 04/24/2020 showed a large malignant  appearing left anterior mediastinal mass measuring 14.6 x 11.7 x 18.5 cm, large left pleural effusion, and small pericardial effusion.  He underwent several thoracenteses during his hospitalization as well as a CT-guided biopsy of the mediastinal mass.  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  Echocardiogram performed on 04/27/2020 showed a LVEF of 60 to 65%.    The patient was started on systemic chemotherapy with EPOCH-R.    A PET scan performed after only 1 cycle of chemotherapy showed considerable reduction in the size of the anterior mediastinal mass and in the left pleural tumor deposits.   The patient was seen on the day of admission for cycle #3 of his chemotherapy.  He felt well overall. His cough and shortness of breath are significantly improved.  He did not have any recent fevers, chills, mucositis, chest pain.  He was not having any abdominal pain, nausea, vomiting, constipation, diarrhea.  No bleeding reported. He was admitted for cycle #3 of EPOCH-R.  Hospital Course:  [name] started his chemotherapy as planned on 06/16/2020.  He completed 5 days of his chemotherapy and also received rituximab on day 5.  He tolerated his treatment well overall.  He developed leukocytosis and thrombocytosis during the admission likely due to steroids.  Leukocytosis had resolved on the day of discharge but he still had mild thrombocytosis.  He also has mild anemia secondary to his chemotherapy.  LDH has normalized.  The patient was seen in the morning of 06/20/2020 and has remained stable.  Once his chemotherapy and rituximab are complete, he will be discharged home.  Physical Exam Vitals reviewed.  Constitutional:      General: He is not in acute distress. HENT:     Head: Normocephalic.     Nose: Nose normal.     Mouth/Throat:     Pharynx: Oropharynx is clear.  Eyes:      General: No scleral icterus.    Conjunctiva/sclera: Conjunctivae normal.  Cardiovascular:     Rate and Rhythm: Normal rate and regular rhythm.     Pulses: Normal pulses.     Heart sounds: Normal heart sounds.  Pulmonary:     Effort: Pulmonary effort is normal.     Lungs clear bilaterally Abdominal:     General: Bowel sounds are normal. There is no distension.     Palpations: Abdomen is soft.  Musculoskeletal:        General: Normal range of motion.  Skin:    General: Skin is warm and dry.  Neurological:     General: No focal deficit present.  Mental Status: He is alert and oriented to person, place, and time.  Psychiatric:        Mood and Affect: Mood normal.        Behavior: Behavior normal.        Thought Content: Thought content normal.        Judgment: Judgment normal.    Discharge Instructions    Activity as tolerated - No restrictions   Complete by: As directed    Diet general   Complete by: As directed     He is scheduled for Neulasta injection on 06/23/2020 and a follow-up visit with lab work on 07/02/2020.  A repeat PET scan has been ordered and will be performed prior to his next cycle of chemotherapy.  He will be contacted with the date and time of the PET scan I radiology.  SignedMikey Bussing 06/20/2020, 10:21 AM

## 2020-06-20 NOTE — Progress Notes (Signed)
Mr. Volante will complete his third cycle of chemotherapy today.  He is done quite nicely.  He has had no toxicity today.  There is no mouth sores.  He has had no cough or shortness of breath.  He still has little bit of fatigue but he is quite active.  He does a lot of walking.  His appetite is good.  There is no nausea or vomiting.  He has had no problems with diarrhea.  He has had no bleeding.  There is no rashes.  His labs that we have back so far look good.  His white cell count is 9.3.  Hemoglobin 9.9.  Platelet count 405K.  His vital signs all look stable.  Temperature 97.6.  Pulse 66.  Blood pressure 125/92.  His oral exam shows no mucositis.  Neck is supple with no adenopathy.  Lungs are clear bilaterally.  He has good breath sounds bilaterally.  Cardiac exam regular rate and rhythm with no murmurs, rubs or bruits.  Abdomen is soft.  He has good bowel sounds.  There is no fluid wave.  There is no guarding or rebound tenderness peer extremities shows no clubbing, cyanosis or edema.  Neurological exam shows no focal neurological deficits.  He will get his Rituxan today.  After this, he will then go home.  We will restart the ciprofloxacin today.  I will also restart the Diflucan today.  He will come back to the office on Monday for his Neulasta injection.  He is already set up for his PET scan.  I think his next admission will be in 3 weeks.  I appreciate the outstanding care that he got from all the staff up on [facility] [address].  The staff, as always, has done a tremendous job.  Lattie Haw, MD  Hebrew[place] 12:12

## 2020-06-20 NOTE — Progress Notes (Signed)
Patient discharged home/self care. Discharge instructions given to patient and reviewed. Patient understood discharge instructions. Patient is ambulatory, will leave via personal vehicle/family. No questions voiced by patient at this time. Patient left ambulatory.

## 2020-06-23 ENCOUNTER — Encounter: Payer: Self-pay | Admitting: *Deleted

## 2020-06-23 ENCOUNTER — Inpatient Hospital Stay: Payer: BC Managed Care – PPO

## 2020-06-23 ENCOUNTER — Other Ambulatory Visit: Payer: Self-pay

## 2020-06-23 VITALS — BP 115/78 | HR 80 | Temp 98.0°F | Resp 18

## 2020-06-23 DIAGNOSIS — Z452 Encounter for adjustment and management of vascular access device: Secondary | ICD-10-CM | POA: Diagnosis not present

## 2020-06-23 DIAGNOSIS — Z5111 Encounter for antineoplastic chemotherapy: Secondary | ICD-10-CM

## 2020-06-23 DIAGNOSIS — C8582 Other specified types of non-Hodgkin lymphoma, intrathoracic lymph nodes: Secondary | ICD-10-CM

## 2020-06-23 DIAGNOSIS — Z5189 Encounter for other specified aftercare: Secondary | ICD-10-CM | POA: Diagnosis not present

## 2020-06-23 DIAGNOSIS — C8332 Diffuse large B-cell lymphoma, intrathoracic lymph nodes: Secondary | ICD-10-CM

## 2020-06-23 DIAGNOSIS — C8520 Mediastinal (thymic) large B-cell lymphoma, unspecified site: Secondary | ICD-10-CM | POA: Diagnosis not present

## 2020-06-23 MED ORDER — PEGFILGRASTIM-CBQV 6 MG/0.6ML ~~LOC~~ SOSY
6.0000 mg | PREFILLED_SYRINGE | Freq: Once | SUBCUTANEOUS | Status: AC
  Administered 2020-06-23: 6 mg via SUBCUTANEOUS

## 2020-06-23 NOTE — Patient Instructions (Signed)

## 2020-06-30 ENCOUNTER — Other Ambulatory Visit: Payer: Self-pay | Admitting: *Deleted

## 2020-06-30 ENCOUNTER — Encounter: Payer: Self-pay | Admitting: *Deleted

## 2020-06-30 ENCOUNTER — Telehealth: Payer: Self-pay | Admitting: *Deleted

## 2020-06-30 ENCOUNTER — Ambulatory Visit
Admission: RE | Admit: 2020-06-30 | Discharge: 2020-06-30 | Disposition: A | Discharge status: Home / Self Care | Payer: BC Managed Care – PPO | Source: Ambulatory Visit | Attending: Hematology & Oncology | Admitting: Hematology & Oncology

## 2020-06-30 DIAGNOSIS — R918 Other nonspecific abnormal finding of lung field: Secondary | ICD-10-CM | POA: Diagnosis present

## 2020-06-30 DIAGNOSIS — C8332 Diffuse large B-cell lymphoma, intrathoracic lymph nodes: Secondary | ICD-10-CM

## 2020-06-30 MED ORDER — HYDROCODONE-HOMATROPINE 5-1.5 MG/5ML PO SYRP: 5.0000 mL | ORAL_SOLUTION | Freq: Four times a day (QID) | ORAL | 0 refills | Status: DC | PRN

## 2020-06-30 NOTE — Telephone Encounter (Signed)
Received a call from patient complaining of worsening cough similar to when he was first diagnosed.  He is also taking shallow breaths and energy level is less.  Dr Marin Olp notified.  CXR ordered and refill on Hycodan sent to patient local pharmacy

## 2020-07-01 ENCOUNTER — Telehealth: Payer: Self-pay | Admitting: *Deleted

## 2020-07-01 NOTE — Telephone Encounter (Signed)
-----   Message from Volanda Napoleon, MD sent at 07/01/2020  9:03 AM EST ----- Call - the CXR shows some lung inflammation -- likely from chemo.  Possible pneumonia, but you are on antibiotics.  No fluid is building up!!  Laurey Arrow

## 2020-07-01 NOTE — Telephone Encounter (Signed)
Pt notified per order of Dr. Marin Olp that "the CXR shows some lung inflammation-likely from chemo. Possible pneumonia, but you are on antibiotics. (Cipro)  No fluid is building up!!  Travis Duran"  Pt appreciative of call and has no questions at this time.

## 2020-07-02 ENCOUNTER — Inpatient Hospital Stay (HOSPITAL_BASED_OUTPATIENT_CLINIC_OR_DEPARTMENT_OTHER): Payer: BC Managed Care – PPO | Admitting: Hematology & Oncology

## 2020-07-02 ENCOUNTER — Other Ambulatory Visit: Payer: Self-pay

## 2020-07-02 ENCOUNTER — Encounter: Payer: Self-pay | Admitting: *Deleted

## 2020-07-02 ENCOUNTER — Encounter: Payer: Self-pay | Admitting: Hematology & Oncology

## 2020-07-02 ENCOUNTER — Inpatient Hospital Stay: Payer: BC Managed Care – PPO | Attending: Hematology & Oncology

## 2020-07-02 ENCOUNTER — Ambulatory Visit (HOSPITAL_BASED_OUTPATIENT_CLINIC_OR_DEPARTMENT_OTHER)
Admission: RE | Admit: 2020-07-02 | Discharge: 2020-07-02 | Disposition: A | Discharge status: Home / Self Care | Payer: BC Managed Care – PPO | Source: Ambulatory Visit | Attending: Family | Admitting: Family

## 2020-07-02 ENCOUNTER — Encounter (HOSPITAL_BASED_OUTPATIENT_CLINIC_OR_DEPARTMENT_OTHER): Payer: Self-pay

## 2020-07-02 ENCOUNTER — Inpatient Hospital Stay: Payer: BC Managed Care – PPO

## 2020-07-02 ENCOUNTER — Other Ambulatory Visit: Payer: Self-pay | Admitting: Family

## 2020-07-02 ENCOUNTER — Inpatient Hospital Stay (HOSPITAL_BASED_OUTPATIENT_CLINIC_OR_DEPARTMENT_OTHER)
Admission: EM | Admit: 2020-07-02 | Discharge: 2020-07-05 | DRG: 175 | Disposition: A | Discharge status: Home / Self Care | Payer: BC Managed Care – PPO | Attending: Internal Medicine | Admitting: Internal Medicine

## 2020-07-02 VITALS — BP 103/71 | HR 116 | Temp 98.3°F | Resp 40 | Wt 186.0 lb

## 2020-07-02 DIAGNOSIS — N529 Male erectile dysfunction, unspecified: Secondary | ICD-10-CM | POA: Diagnosis present

## 2020-07-02 DIAGNOSIS — C8332 Diffuse large B-cell lymphoma, intrathoracic lymph nodes: Secondary | ICD-10-CM

## 2020-07-02 DIAGNOSIS — I2694 Multiple subsegmental pulmonary emboli without acute cor pulmonale: Secondary | ICD-10-CM

## 2020-07-02 DIAGNOSIS — D6481 Anemia due to antineoplastic chemotherapy: Secondary | ICD-10-CM | POA: Diagnosis present

## 2020-07-02 DIAGNOSIS — T451X5A Adverse effect of antineoplastic and immunosuppressive drugs, initial encounter: Secondary | ICD-10-CM | POA: Diagnosis present

## 2020-07-02 DIAGNOSIS — R0602 Shortness of breath: Secondary | ICD-10-CM

## 2020-07-02 DIAGNOSIS — R079 Chest pain, unspecified: Secondary | ICD-10-CM

## 2020-07-02 DIAGNOSIS — Z79899 Other long term (current) drug therapy: Secondary | ICD-10-CM | POA: Diagnosis not present

## 2020-07-02 DIAGNOSIS — Y95 Nosocomial condition: Secondary | ICD-10-CM | POA: Diagnosis present

## 2020-07-02 DIAGNOSIS — I2699 Other pulmonary embolism without acute cor pulmonale: Principal | ICD-10-CM

## 2020-07-02 DIAGNOSIS — Z20822 Contact with and (suspected) exposure to covid-19: Secondary | ICD-10-CM | POA: Diagnosis present

## 2020-07-02 DIAGNOSIS — M1711 Unilateral primary osteoarthritis, right knee: Secondary | ICD-10-CM | POA: Diagnosis present

## 2020-07-02 DIAGNOSIS — C8520 Mediastinal (thymic) large B-cell lymphoma, unspecified site: Secondary | ICD-10-CM | POA: Diagnosis present

## 2020-07-02 DIAGNOSIS — J910 Malignant pleural effusion: Secondary | ICD-10-CM | POA: Diagnosis not present

## 2020-07-02 DIAGNOSIS — J9601 Acute respiratory failure with hypoxia: Secondary | ICD-10-CM | POA: Diagnosis not present

## 2020-07-02 DIAGNOSIS — R0902 Hypoxemia: Secondary | ICD-10-CM

## 2020-07-02 DIAGNOSIS — G4733 Obstructive sleep apnea (adult) (pediatric): Secondary | ICD-10-CM | POA: Diagnosis present

## 2020-07-02 DIAGNOSIS — R000 Tachycardia, unspecified: Secondary | ICD-10-CM

## 2020-07-02 DIAGNOSIS — J704 Drug-induced interstitial lung disorders, unspecified: Secondary | ICD-10-CM | POA: Diagnosis present

## 2020-07-02 DIAGNOSIS — D849 Immunodeficiency, unspecified: Secondary | ICD-10-CM | POA: Diagnosis present

## 2020-07-02 DIAGNOSIS — Z7901 Long term (current) use of anticoagulants: Secondary | ICD-10-CM | POA: Insufficient documentation

## 2020-07-02 DIAGNOSIS — C8582 Other specified types of non-Hodgkin lymphoma, intrathoracic lymph nodes: Secondary | ICD-10-CM

## 2020-07-02 DIAGNOSIS — I2602 Saddle embolus of pulmonary artery with acute cor pulmonale: Secondary | ICD-10-CM | POA: Diagnosis not present

## 2020-07-02 DIAGNOSIS — J189 Pneumonia, unspecified organism: Secondary | ICD-10-CM | POA: Diagnosis present

## 2020-07-02 DIAGNOSIS — C8330 Diffuse large B-cell lymphoma, unspecified site: Secondary | ICD-10-CM

## 2020-07-02 DIAGNOSIS — R918 Other nonspecific abnormal finding of lung field: Secondary | ICD-10-CM | POA: Diagnosis not present

## 2020-07-02 HISTORY — DX: Other pulmonary embolism without acute cor pulmonale: I26.99

## 2020-07-02 HISTORY — DX: Acute respiratory failure with hypoxia: J96.01

## 2020-07-02 LAB — CBC WITH DIFFERENTIAL (CANCER CENTER ONLY)
Abs Immature Granulocytes: 8.32 10*3/uL — ABNORMAL HIGH (ref 0.00–0.07)
Basophils Absolute: 0.1 10*3/uL (ref 0.0–0.1)
Basophils Relative: 0 %
Eosinophils Absolute: 0.1 10*3/uL (ref 0.0–0.5)
Eosinophils Relative: 0 %
HCT: 30.8 % — ABNORMAL LOW (ref 39.0–52.0)
Hemoglobin: 9.8 g/dL — ABNORMAL LOW (ref 13.0–17.0)
Immature Granulocytes: 25 %
Lymphocytes Relative: 4 %
Lymphs Abs: 1.2 10*3/uL (ref 0.7–4.0)
MCH: 27.8 pg (ref 26.0–34.0)
MCHC: 31.8 g/dL (ref 30.0–36.0)
MCV: 87.3 fL (ref 80.0–100.0)
Monocytes Absolute: 1.8 10*3/uL — ABNORMAL HIGH (ref 0.1–1.0)
Monocytes Relative: 5 %
Neutro Abs: 22.1 10*3/uL — ABNORMAL HIGH (ref 1.7–7.7)
Neutrophils Relative %: 66 %
Platelet Count: 386 10*3/uL (ref 150–400)
RBC: 3.53 MIL/uL — ABNORMAL LOW (ref 4.22–5.81)
RDW: 19 % — ABNORMAL HIGH (ref 11.5–15.5)
WBC Count: 33.6 10*3/uL — ABNORMAL HIGH (ref 4.0–10.5)
nRBC: 0.4 % — ABNORMAL HIGH (ref 0.0–0.2)

## 2020-07-02 LAB — LACTATE DEHYDROGENASE: LDH: 440 U/L — ABNORMAL HIGH (ref 98–192)

## 2020-07-02 LAB — CMP (CANCER CENTER ONLY)
ALT: 29 U/L (ref 0–44)
AST: 23 U/L (ref 15–41)
Albumin: 3.7 g/dL (ref 3.5–5.0)
Alkaline Phosphatase: 129 U/L — ABNORMAL HIGH (ref 38–126)
Anion gap: 8 (ref 5–15)
BUN: 15 mg/dL (ref 6–20)
CO2: 25 mmol/L (ref 22–32)
Calcium: 9.4 mg/dL (ref 8.9–10.3)
Chloride: 103 mmol/L (ref 98–111)
Creatinine: 1.09 mg/dL (ref 0.61–1.24)
GFR, Estimated: >60 mL/min (ref >60)
Glucose, Bld: 111 mg/dL — ABNORMAL HIGH (ref 70–99)
Potassium: 3.8 mmol/L (ref 3.5–5.1)
Sodium: 136 mmol/L (ref 135–145)
Total Bilirubin: 0.3 mg/dL (ref 0.3–1.2)
Total Protein: 6.3 g/dL — ABNORMAL LOW (ref 6.5–8.1)

## 2020-07-02 LAB — C-REACTIVE PROTEIN: CRP: 12.4 mg/dL — ABNORMAL HIGH (ref <1.0)

## 2020-07-02 LAB — FERRITIN: Ferritin: 685 ng/mL — ABNORMAL HIGH (ref 24–336)

## 2020-07-02 LAB — RESP PANEL BY RT-PCR (FLU A&B, COVID) ARPGX2
Influenza A by PCR: NEGATIVE
Influenza B by PCR: NEGATIVE
SARS Coronavirus 2 by RT PCR: NEGATIVE

## 2020-07-02 LAB — HEPARIN LEVEL (UNFRACTIONATED): Heparin Unfractionated: 0.2 IU/mL — ABNORMAL LOW (ref 0.30–0.70)

## 2020-07-02 LAB — FIBRINOGEN: Fibrinogen: 694 mg/dL — ABNORMAL HIGH (ref 210–475)

## 2020-07-02 LAB — D-DIMER, QUANTITATIVE: D-Dimer, Quant: 1.18 ug/mL-FEU — ABNORMAL HIGH (ref 0.00–0.50)

## 2020-07-02 LAB — PROCALCITONIN: Procalcitonin: 0.17 ng/mL

## 2020-07-02 LAB — TRIGLYCERIDES: Triglycerides: 137 mg/dL (ref <150)

## 2020-07-02 LAB — MRSA PCR SCREENING: MRSA by PCR: NEGATIVE

## 2020-07-02 LAB — LACTIC ACID, PLASMA: Lactic Acid, Venous: 1.2 mmol/L (ref 0.5–1.9)

## 2020-07-02 MED ORDER — HEPARIN BOLUS VIA INFUSION
6000.0000 [IU] | Freq: Once | INTRAVENOUS | Status: AC
  Administered 2020-07-02: 6000 [IU] via INTRAVENOUS

## 2020-07-02 MED ORDER — ONDANSETRON HCL 4 MG PO TABS: 4.0000 mg | ORAL_TABLET | Freq: Four times a day (QID) | ORAL | Status: DC | PRN

## 2020-07-02 MED ORDER — IOHEXOL 350 MG/ML SOLN
100.0000 mL | Freq: Once | INTRAVENOUS | Status: AC | PRN
  Administered 2020-07-02: 75 mL via INTRAVENOUS

## 2020-07-02 MED ORDER — HEPARIN SOD (PORK) LOCK FLUSH 100 UNIT/ML IV SOLN
500.0000 [IU] | Freq: Once | INTRAVENOUS | Status: AC
  Administered 2020-07-02: 500 [IU] via INTRAVENOUS
  Filled 2020-07-02: qty 5

## 2020-07-02 MED ORDER — SODIUM CHLORIDE 0.9% FLUSH
3.0000 mL | Freq: Two times a day (BID) | INTRAVENOUS | Status: DC
  Administered 2020-07-03 – 2020-07-04 (×4): 3 mL via INTRAVENOUS

## 2020-07-02 MED ORDER — ONDANSETRON HCL 4 MG/2ML IJ SOLN: 4.0000 mg | Freq: Four times a day (QID) | INTRAMUSCULAR | Status: DC | PRN

## 2020-07-02 MED ORDER — HEPARIN (PORCINE) 25000 UT/250ML-% IV SOLN
1600.0000 [IU]/h | INTRAVENOUS | Status: DC
  Administered 2020-07-02: 1450 [IU]/h via INTRAVENOUS
  Administered 2020-07-03 – 2020-07-04 (×3): 1600 [IU]/h via INTRAVENOUS
  Filled 2020-07-02 (×5): qty 250

## 2020-07-02 MED ORDER — SODIUM CHLORIDE 0.9 % IV SOLN
2.0000 g | Freq: Once | INTRAVENOUS | Status: AC
  Administered 2020-07-02: 2 g via INTRAVENOUS

## 2020-07-02 MED ORDER — SODIUM CHLORIDE 0.9% FLUSH
10.0000 mL | INTRAVENOUS | Status: DC | PRN
  Administered 2020-07-02: 10 mL via INTRAVENOUS
  Filled 2020-07-02: qty 10

## 2020-07-02 MED ORDER — SODIUM CHLORIDE 0.9 % IV SOLN
2.0000 g | Freq: Three times a day (TID) | INTRAVENOUS | Status: DC
  Administered 2020-07-03 – 2020-07-05 (×8): 2 g via INTRAVENOUS
  Filled 2020-07-02 (×9): qty 2

## 2020-07-02 MED ORDER — SODIUM CHLORIDE 0.9 % IV SOLN
1.0000 g | Freq: Once | INTRAVENOUS | Status: DC
  Filled 2020-07-02: qty 1

## 2020-07-02 MED ORDER — ORAL CARE MOUTH RINSE
15.0000 mL | Freq: Two times a day (BID) | OROMUCOSAL | Status: DC
  Administered 2020-07-02 – 2020-07-05 (×5): 15 mL via OROMUCOSAL

## 2020-07-02 MED ORDER — SODIUM CHLORIDE 0.9 % IV SOLN
INTRAVENOUS | Status: DC | PRN
  Administered 2020-07-02: 250 mL via INTRAVENOUS

## 2020-07-02 MED ORDER — ACETAMINOPHEN 650 MG RE SUPP: 650.0000 mg | Freq: Four times a day (QID) | RECTAL | Status: DC | PRN

## 2020-07-02 MED ORDER — CHLORHEXIDINE GLUCONATE CLOTH 2 % EX PADS
6.0000 | MEDICATED_PAD | Freq: Every day | CUTANEOUS | Status: DC
  Administered 2020-07-02 – 2020-07-05 (×4): 6 via TOPICAL

## 2020-07-02 MED ORDER — SODIUM CHLORIDE 0.9 % IV SOLN
INTRAVENOUS | Status: AC
  Filled 2020-07-02: qty 2

## 2020-07-02 MED ORDER — VANCOMYCIN HCL IN DEXTROSE 1-5 GM/200ML-% IV SOLN
2000.0000 mg | Freq: Once | INTRAVENOUS | Status: AC
  Administered 2020-07-02: 2000 mg via INTRAVENOUS
  Filled 2020-07-02: qty 400

## 2020-07-02 MED ORDER — ACETAMINOPHEN 325 MG PO TABS: 650.0000 mg | ORAL_TABLET | Freq: Four times a day (QID) | ORAL | Status: DC | PRN

## 2020-07-02 MED ORDER — VANCOMYCIN HCL 1250 MG/250ML IV SOLN
1250.0000 mg | Freq: Two times a day (BID) | INTRAVENOUS | Status: DC
  Filled 2020-07-02 (×2): qty 250

## 2020-07-02 NOTE — Progress Notes (Signed)
Patient seen in the office today. Work up reveals PE and possible Covid pneumonia. Being admitted through the ED. Admission cancelled. Notified bed placement, covid screen and inpatient chemo team. Also cancelled tomorrows PET scan. Will follow for additional needs.   Oncology Nurse Navigator Documentation  Oncology Nurse Navigator Flowsheets 07/02/2020  Abnormal Finding Date -  Confirmed Diagnosis Date -  Diagnosis Status -  Planned Course of Treatment -  Phase of Treatment -  Chemotherapy Actual Start Date: -  Navigator Follow Up Date: 07/04/2020  Navigator Follow Up Reason: Appointment Review  Navigator Location [facility]  Navigator Encounter Type Appt/Treatment Plan Review;Telephone  Telephone Outgoing Call  Treatment Initiated Date -  Patient Visit Type MedOnc  Treatment Phase Active Tx  Barriers/Navigation Needs Coordination of Care;Education  Education -  Interventions Coordination of Care  Acuity Level 3-Moderate Needs (3-4 Barriers Identified)  Coordination of Care Other;Appts  Education Method -  Support Groups/Services Friends and Family  Time Spent with Patient 19
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
## 2020-07-02 NOTE — Progress Notes (Addendum)
Antibiotic Consult  Requested to dose Vancomycin for PNA in this patient.   - Vancomycin 2000mg  IV x 1 dose  - Followed by Vancomycin 1250mg  IV q12h (prefer to avoid q8h dosing in patients > 44 yo)  - Monitor renal function and urine output   ANTICOAGULATION CONSULT NOTE  Pharmacy Consult for Heparin Indication: pulmonary embolus  No Known Allergies  Patient Measurements: Height: 5\' 9"  (175.3 cm) Weight: 87.1 kg (192 lb) IBW/kg (Calculated) : 70.7 Heparin Dosing Weight: 87.1kg  Vital Signs: Temp: 98.1 F (36.7 C) (12/01 1431) Temp Source: Oral (12/01 1431) BP: 113/85 (12/01 1446) Pulse Rate: 104 (12/01 1431)  Labs: Recent Labs    07/02/20 1135  HGB 9.8*  HCT 30.8*  PLT 386  CREATININE 1.09    Estimated Creatinine Clearance: 94.6 mL/min (by C-G formula based on SCr of 1.09 mg/dL).   Medical History: Past Medical History:  Diagnosis Date  . Arthritis of right knee   . ED (erectile dysfunction)   . Lymphoma, large cell, intrathoracic lymph nodes ([place]) 05/01/2020  . OSA (obstructive sleep apnea) 2007   with AHI 32/hr  . Pulmonary embolism ([place]) 07/02/2020    Medications:  Scheduled:  . heparin  6,000 Units Intravenous Once    Assessment: Patient is a 44 yom that presents to the ED with c/o SOB. Patient does have B cell lymphoma and was dx with a PE. Pharmacy has been asked to dose heparin at this time for PE.   Goal of Therapy:  Heparin level 0.3-0.7 units/ml Monitor platelets by anticoagulation protocol: Yes   Plan:  - Heparin bolus 6000 units IV x 1 dose - Heparin drip @ 1450 units/hr - Heparin level in ~ 6 hours  - Monitor patient for s/s of bleeding and CBC while on heparin   Duanne Limerick PharmD. BCPS  07/02/2020,3:14 PM

## 2020-07-02 NOTE — Progress Notes (Addendum)
Patient's SPO2 dropped to 84%, and his WOB increased and RR increased to 40 while on 6 liter nasal cannula.  Patient was placed on 100% NRB at 15 liters.  Patient's SPO2 increased to 99% and his WOB decreased and respriatory rate decreased to 30.

## 2020-07-02 NOTE — Progress Notes (Signed)
Hematology and Oncology Follow Up Visit  Travis Duran [id number] 21-Dec-1975 44 y.o. 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  Unfortunately, we have issues at this point.  He called last week saying that he was little bit short of breath and had a bad cough.  We ultimately did a chest x-ray on him 2 days ago.  Chest x-ray shows some opacities in the lower lobes.  There is no pleural effusion.  He was on broad-spectrum antibiotics with ciprofloxacin.  He is also on of Famvir and Diflucan as prophylaxis for neutropenia from his chemotherapy.  Of note, he completed chemotherapy about 2 weeks ago.  He came to the office today.  He was hypoxic.  He was tachycardic.  We went ahead and got a CT angiogram of his chest.  He does have bilateral pulmonary emboli.  However, the real problem is that he has his profound widespread pneumonitis in his lungs.  He said he was tested for Covid last week.  I think there was may been some exposure.  He clearly is going need to be admitted.  He will need IV heparin.  He will also need to be worked up for the pneumonitis.  His white cell count is quite elevated which is likely from the Neulasta that he received.  He has had Neulasta before.  I would think a pneumonitis from Neulasta would be a little unusual.  His chemotherapy really should not cause problems with pneumonitis.  He is not on high-dose chemotherapy.  He gets infusional chemotherapy.  Again, this certainly looks like Covid pneumonitis.  He will have to be checked again.  He is had a cough that is nonproductive.  There is no hemoptysis.  Provide on these had any fever.  He has had no diarrhea.  He was scheduled for chemotherapy #4 to start next week.  Currently, his performance status is ECOG 1.   Medications:  Current Outpatient  Medications:  .  antiseptic oral rinse (BIOTENE) LIQD, 15 mLs by Mouth Rinse route every 4 (four) hours., Disp: 1000 mL, Rfl: 3 .  ciprofloxacin (CIPRO) 500 MG tablet, Take 1 tablet (500 mg total) by mouth daily with breakfast., Disp: 30 tablet, Rfl: 5 .  famciclovir (FAMVIR) 500 MG tablet, Take 1 tablet (500 mg total) by mouth daily., Disp: 30 tablet, Rfl: 5 .  fluconazole (DIFLUCAN) 100 MG tablet, Take 1 tablet (100 mg total) by mouth daily., Disp: 14 tablet, Rfl: 5 .  HYDROcodone-homatropine (HYCODAN) 5-1.5 MG/5ML syrup, Take 5 mLs by mouth every 6 (six) hours as needed for cough., Disp: 120 mL, Rfl: 0 .  Melatonin 3 MG CAPS, Take 3 mg by mouth at bedtime. , Disp: , Rfl:  .  Sodium Chloride-Sodium Bicarb (SODIUM BICARBONATE/SODIUM CHLORIDE) SOLN, 1 application by Mouth Rinse route every 4 (four) hours., Disp: , Rfl:  .  tadalafil (CIALIS) 10 MG tablet, Take 1 tablet (10 mg total) by mouth daily as needed for erectile dysfunction., Disp: 10 tablet, Rfl: 11 .  chlorhexidine (PERIDEX) 0.12 % solution, SMARTSIG:By Mouth, Disp: , Rfl:  .  ondansetron (ZOFRAN) 4 MG tablet, Take 1-2 tablets (4-8 mg total) by mouth every 8 (eight) hours as needed for nausea (not responsive to prochlorperazine (COMPAZINE)). (Patient not taking: Reported on 07/02/2020), Disp: 20 tablet, Rfl: 0 .  prochlorperazine (  COMPAZINE) 10 MG tablet, Take 1 tablet (10 mg total) by mouth every 6 (six) hours as needed for nausea or vomiting. (Patient not taking: Reported on 07/02/2020), Disp: 30 tablet, Rfl: 0 No current facility-administered medications for this visit.  Facility-Administered Medications Ordered in Other Visits:  .  sodium chloride flush (NS) 0.9 % injection 10 mL, 10 mL, Intravenous, PRN, Volanda Napoleon, MD, 10 mL at 07/02/20 1140  Allergies: No Known Allergies  Past Medical History, Surgical history, Social history, and Family History were reviewed and updated.  Review of Systems: Review of Systems   Constitutional: Negative for unexpected weight change.  HENT:   Negative for mouth sores.   Eyes: Negative.   Respiratory: Positive for chest tightness, cough and shortness of breath.   Cardiovascular: Negative.   Gastrointestinal: Negative.   Endocrine: Negative.   Genitourinary: Negative.    Musculoskeletal: Negative.   Skin: Negative.   Neurological: Negative.   Hematological: Negative.   Psychiatric/Behavioral: Negative.     Physical Exam:  weight is 186 lb (84.4 kg). His oral temperature is 98.3 F (36.8 C). His blood pressure is 103/71 and his pulse is 116 (abnormal). His respiration is 40 (abnormal) and oxygen saturation is 97%.   Wt Readings from Last 3 Encounters:  07/02/20 186 lb (84.4 kg)  06/16/20 186 lb (84.4 kg)  06/10/20 189 lb 8 oz (86 kg)    Physical Exam Vitals reviewed.  HENT:     Head: Normocephalic and atraumatic.  Eyes:     Pupils: Pupils are equal, round, and reactive to light.  Cardiovascular:     Rate and Rhythm: Regular rhythm. Tachycardia present.     Heart sounds: Normal heart sounds.     Comments: Cardiac exam is tachycardic but regular.  He has no murmurs rubs or bruits. Pulmonary:     Comments: Pulmonary exam shows wheezing bilaterally.  He has crackles bilaterally.  He has decent air movement.   Abdominal:     General: Bowel sounds are normal.     Palpations: Abdomen is soft.  Musculoskeletal:        General: No tenderness or deformity. Normal range of motion.     Cervical back: Normal range of motion.  Lymphadenopathy:     Cervical: No cervical adenopathy.  Skin:    General: Skin is warm and dry.     Findings: No erythema or rash.  Neurological:     Mental Status: He is alert and oriented to person, place, and time.  Psychiatric:        Behavior: Behavior normal.        Thought Content: Thought content normal.        Judgment: Judgment normal.    Lab Results  Component Value Date   WBC 33.6 (H) 07/02/2020   HGB 9.8 (L)  07/02/2020   HCT 30.8 (L) 07/02/2020   MCV 87.3 07/02/2020   PLT 386 07/02/2020     Chemistry      Component Value Date/Time   NA 136 07/02/2020 1135   K 3.8 07/02/2020 1135   CL 103 07/02/2020 1135   CO2 25 07/02/2020 1135   BUN 15 07/02/2020 1135   CREATININE 1.09 07/02/2020 1135      Component Value Date/Time   CALCIUM 9.4 07/02/2020 1135   ALKPHOS 129 (H) 07/02/2020 1135   AST 23 07/02/2020 1135   ALT 29 07/02/2020 1135   BILITOT 0.3 07/02/2020 1135      Impression and Plan: Mr. Littlefield is  a really nice 44 year old white male.  He presented with a massive mediastinal tumor.  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  This is a B-cell lymphoma.  Unfortunately, he is going to have to be admitted.  We will get him down to the emergency room.  Again this certainly looks like COVID pneumonitis.  The fact that has a pulmonary emboli could also be related to Covid.  He will need to have his legs checked for thromboembolic disease.  I would have to think that he will be on some type of monoclonal antibody therapy for Covid.  We will see him once he is admitted.  I know that the hospitalist and the floor that he is on will do a fantastic job with him.     Volanda Napoleon, MD 12/1/20212:04 PM

## 2020-07-02 NOTE — ED Notes (Signed)
Pt called out from room stating he had a coughing fit and now can't catch his breath. This RN entered room and pt 74% on 1L [place]. Pt O2 increased to 4L. Increased O2 to 92%. RT at bedside.

## 2020-07-02 NOTE — Plan of Care (Signed)
[facility] Plan of Care Note  Patient: Travis Duran    ULA:[id number]  PCP: Laurey Morale, MD    DOB: Dec 04, 1975  DOS: 07/02/2020   Received a phone call from [facility] regarding transfer of [name]. Requesting: Dr. Reubin Milan Reason for transfer: Admission History: DLBCL on EPOCH-R therapy.  Completed cycle 3 on 06/20/2020.  Started with shortness of breath 1 week ago.  Chest x-ray was done shows opacity.  Patient was on famciclovir Diflucan and Cipro for prophylaxis.  Covid x2 - negative. Vitals: RR 20-30, tachycardic sinus, 74% on 1 LPM. Labs: CT PE protocol shows pulmonary embolism without any RV strain. Covid negative. Exam: Reportedly has bilateral wheezing. Treatment received: Started on heparin and vancomycin and cefepime.  Plan of care: Suspect acute hypoxic respiratory failure secondary to either drug-induced interstitial pneumonitis or atypical pneumonia.  The patient will be accepted for admission to [facility], stepdown unit. Oncology will continue to follow the patient. Do not think that the patient has Covid pneumonia and does not need isolation.  Author: Berle Mull, MD [facility] 07/02/2020  If 7PM-7AM, please contact night-coverage at [email or web address],

## 2020-07-02 NOTE — Patient Instructions (Signed)
Implanted Port Insertion, Care After °This sheet gives you information about how to care for yourself after your procedure. Your health care provider may also give you more specific instructions. If you have problems or questions, contact your health care provider. °What can I expect after the procedure? °After the procedure, it is common to have: °· Discomfort at the port insertion site. °· Bruising on the skin over the port. This should improve over 3-4 days. °Follow these instructions at home: °Port care °· After your port is placed, you will get a manufacturer's information card. The card has information about your port. Keep this card with you at all times. °· Take care of the port as told by your health care provider. Ask your health care provider if you or a family member can get training for taking care of the port at home. A home health care nurse may also take care of the port. °· Make sure to remember what type of port you have. °Incision care ° °  ° °· Follow instructions from your health care provider about how to take care of your port insertion site. Make sure you: °? Wash your hands with soap and water before and after you change your bandage (dressing). If soap and water are not available, use hand sanitizer. °? Change your dressing as told by your health care provider. °? Leave stitches (sutures), skin glue, or adhesive strips in place. These skin closures may need to stay in place for 2 weeks or longer. If adhesive strip edges start to loosen and curl up, you may trim the loose edges. Do not remove adhesive strips completely unless your health care provider tells you to do that. °· Check your port insertion site every day for signs of infection. Check for: °? Redness, swelling, or pain. °? Fluid or blood. °? Warmth. °? Pus or a bad smell. °Activity °· Return to your normal activities as told by your health care provider. Ask your health care provider what activities are safe for you. °· Do not  lift anything that is heavier than 10 lb (4.5 kg), or the limit that you are told, until your health care provider says that it is safe. °General instructions °· Take over-the-counter and prescription medicines only as told by your health care provider. °· Do not take baths, swim, or use a hot tub until your health care provider approves. Ask your health care provider if you may take showers. You may only be allowed to take sponge baths. °· Do not drive for 24 hours if you were given a sedative during your procedure. °· Wear a medical alert bracelet in case of an emergency. This will tell any health care providers that you have a port. °· Keep all follow-up visits as told by your health care provider. This is important. °Contact a health care provider if: °· You cannot flush your port with saline as directed, or you cannot draw blood from the port. °· You have a fever or chills. °· You have redness, swelling, or pain around your port insertion site. °· You have fluid or blood coming from your port insertion site. °· Your port insertion site feels warm to the touch. °· You have pus or a bad smell coming from the port insertion site. °Get help right away if: °· You have chest pain or shortness of breath. °· You have bleeding from your port that you cannot control. °Summary °· Take care of the port as told by your health   care provider. Keep the manufacturer's information card with you at all times. °· Change your dressing as told by your health care provider. °· Contact a health care provider if you have a fever or chills or if you have redness, swelling, or pain around your port insertion site. °· Keep all follow-up visits as told by your health care provider. °This information is not intended to replace advice given to you by your health care provider. Make sure you discuss any questions you have with your health care provider. °Document Revised: 02/14/2018 Document Reviewed: 02/14/2018 °Elsevier Patient Education ©  2020 [employer] ° °

## 2020-07-02 NOTE — ED Notes (Signed)
[facility] arrived to transport patient.  

## 2020-07-02 NOTE — ED Notes (Signed)
Sleep mask given to pt for rest.

## 2020-07-02 NOTE — Progress Notes (Signed)
BiPAP ordered PRN, RT/Staff to monitor.
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
## 2020-07-02 NOTE — Progress Notes (Signed)
Notified bed placement, and inpatient chemo team of planned patient admission for cycle 4 of [place] on 07/07/2020. Covid screen scheduled for 07/04/2020.Will educate patient via [place] and confirm receipt of information when in the office later today.  Oncology Nurse Navigator Documentation  Oncology Nurse Navigator Flowsheets 07/02/2020  Abnormal Finding Date -  Confirmed Diagnosis Date -  Diagnosis Status -  Planned Course of Treatment -  Phase of Treatment -  Chemotherapy Actual Start Date: -  Navigator Follow Up Date: 07/02/2020  Navigator Follow Up Reason: Follow-up Appointment  Navigator Location [facility]  Navigator Encounter Type Appt/Treatment Plan Review  Telephone -  Treatment Initiated Date -  Patient Visit Type MedOnc  Treatment Phase Active Tx  Barriers/Navigation Needs Coordination of Care;Education  Education Other  Interventions Coordination of Care  Acuity Level 3-Moderate Needs (3-4 Barriers Identified)  Coordination of Care Appts;Other  Education Method Written  Support Groups/Services Friends and Family  Time Spent with Patient 54

## 2020-07-02 NOTE — ED Triage Notes (Signed)
Pt complaint of cough with shortness of breath since 06/22/2020, negative COVID test, increase cough/shortness of breath.

## 2020-07-03 ENCOUNTER — Other Ambulatory Visit: Payer: Self-pay

## 2020-07-03 ENCOUNTER — Telehealth: Payer: Self-pay | Admitting: Hematology & Oncology

## 2020-07-03 ENCOUNTER — Inpatient Hospital Stay (HOSPITAL_COMMUNITY): Payer: BC Managed Care – PPO

## 2020-07-03 ENCOUNTER — Ambulatory Visit: Payer: BC Managed Care – PPO

## 2020-07-03 ENCOUNTER — Inpatient Hospital Stay (HOSPITAL_BASED_OUTPATIENT_CLINIC_OR_DEPARTMENT_OTHER): Payer: BC Managed Care – PPO

## 2020-07-03 ENCOUNTER — Encounter (HOSPITAL_COMMUNITY)
Admission: EM | Disposition: A | Discharge status: Home / Self Care | Payer: Self-pay | Source: Home / Self Care | Attending: Internal Medicine

## 2020-07-03 DIAGNOSIS — I2699 Other pulmonary embolism without acute cor pulmonale: Secondary | ICD-10-CM | POA: Diagnosis not present

## 2020-07-03 DIAGNOSIS — I2602 Saddle embolus of pulmonary artery with acute cor pulmonale: Secondary | ICD-10-CM

## 2020-07-03 DIAGNOSIS — J9601 Acute respiratory failure with hypoxia: Secondary | ICD-10-CM | POA: Diagnosis not present

## 2020-07-03 DIAGNOSIS — R918 Other nonspecific abnormal finding of lung field: Secondary | ICD-10-CM

## 2020-07-03 HISTORY — PX: BRONCHIAL WASHINGS: SHX5105

## 2020-07-03 HISTORY — PX: VIDEO BRONCHOSCOPY: SHX5072

## 2020-07-03 LAB — ECHOCARDIOGRAM COMPLETE
Area-P 1/2: 4.15 cm2
Calc EF: 58.1 %
Height: 69 in
S' Lateral: 2.67 cm
Single Plane A2C EF: 58.7 %
Single Plane A4C EF: 55.7 %
Weight: 3072 oz

## 2020-07-03 LAB — BRAIN NATRIURETIC PEPTIDE: B Natriuretic Peptide: 205.5 pg/mL — ABNORMAL HIGH (ref 0.0–100.0)

## 2020-07-03 LAB — CBC
HCT: 28.3 % — ABNORMAL LOW (ref 39.0–52.0)
Hemoglobin: 8.8 g/dL — ABNORMAL LOW (ref 13.0–17.0)
MCH: 27.4 pg (ref 26.0–34.0)
MCHC: 31.1 g/dL (ref 30.0–36.0)
MCV: 88.2 fL (ref 80.0–100.0)
Platelets: 327 10*3/uL (ref 150–400)
RBC: 3.21 MIL/uL — ABNORMAL LOW (ref 4.22–5.81)
RDW: 19 % — ABNORMAL HIGH (ref 11.5–15.5)
WBC: 30 10*3/uL — ABNORMAL HIGH (ref 4.0–10.5)
nRBC: 0.3 % — ABNORMAL HIGH (ref 0.0–0.2)

## 2020-07-03 LAB — BODY FLUID CELL COUNT WITH DIFFERENTIAL
Lymphs, Fluid: 40 %
Monocyte-Macrophage-Serous Fluid: 41 % — ABNORMAL LOW (ref 50–90)
Neutrophil Count, Fluid: 19 % (ref 0–25)
Total Nucleated Cell Count, Fluid: 280 cu mm (ref 0–1000)

## 2020-07-03 LAB — BASIC METABOLIC PANEL
Anion gap: 8 (ref 5–15)
BUN: 13 mg/dL (ref 6–20)
CO2: 22 mmol/L (ref 22–32)
Calcium: 7.9 mg/dL — ABNORMAL LOW (ref 8.9–10.3)
Chloride: 108 mmol/L (ref 98–111)
Creatinine, Ser: 0.82 mg/dL (ref 0.61–1.24)
GFR, Estimated: >60 mL/min (ref >60)
Glucose, Bld: 92 mg/dL (ref 70–99)
Potassium: 3.6 mmol/L (ref 3.5–5.1)
Sodium: 138 mmol/L (ref 135–145)

## 2020-07-03 LAB — HEPARIN LEVEL (UNFRACTIONATED)
Heparin Unfractionated: 0.42 IU/mL (ref 0.30–0.70)
Heparin Unfractionated: 0.34 IU/mL (ref 0.30–0.70)

## 2020-07-03 LAB — RESP PANEL BY RT-PCR (FLU A&B, COVID) ARPGX2
Influenza A by PCR: NEGATIVE
Influenza B by PCR: NEGATIVE
SARS Coronavirus 2 by RT PCR: NEGATIVE

## 2020-07-03 SURGERY — VIDEO BRONCHOSCOPY WITHOUT FLUORO
Anesthesia: Moderate Sedation

## 2020-07-03 MED ORDER — METHYLPREDNISOLONE SODIUM SUCC 125 MG IJ SOLR
60.0000 mg | Freq: Two times a day (BID) | INTRAMUSCULAR | Status: DC
  Administered 2020-07-03 – 2020-07-05 (×5): 60 mg via INTRAVENOUS
  Filled 2020-07-03 (×4): qty 2

## 2020-07-03 MED ORDER — BUTAMBEN-TETRACAINE-BENZOCAINE 2-2-14 % EX AERO
1.0000 | INHALATION_SPRAY | Freq: Once | CUTANEOUS | Status: DC
  Administered 2020-07-03: 1 via TOPICAL

## 2020-07-03 MED ORDER — PHENYLEPHRINE HCL 1 % NA SOLN
1.0000 [drp] | Freq: Once | NASAL | Status: AC
  Administered 2020-07-03: 1 [drp] via NASAL

## 2020-07-03 MED ORDER — PHENYLEPHRINE HCL 0.5 % NA SOLN: Freq: Once | NASAL | Status: DC

## 2020-07-03 MED ORDER — BIOTENE DRY MOUTH MT LIQD
15.0000 mL | OROMUCOSAL | Status: DC
  Administered 2020-07-04 (×2): 15 mL via OROMUCOSAL

## 2020-07-03 MED ORDER — HEPARIN BOLUS VIA INFUSION
2000.0000 [IU] | Freq: Once | INTRAVENOUS | Status: AC
  Administered 2020-07-03: 2000 [IU] via INTRAVENOUS
  Filled 2020-07-03: qty 2000

## 2020-07-03 MED ORDER — FENTANYL CITRATE (PF) 100 MCG/2ML IJ SOLN
INTRAMUSCULAR | Status: DC | PRN
Start: 2020-07-03 — End: 2020-07-03
  Administered 2020-07-03 (×3): 25 ug via INTRAVENOUS
  Administered 2020-07-03: 50 ug via INTRAVENOUS
  Administered 2020-07-03: 25 ug via INTRAVENOUS

## 2020-07-03 MED ORDER — LIDOCAINE HCL URETHRAL/MUCOSAL 2 % EX GEL: 1.0000 "application " | Freq: Once | CUTANEOUS | Status: DC

## 2020-07-03 MED ORDER — PHENYLEPHRINE HCL 1 % NA SOLN
NASAL | Status: AC
  Filled 2020-07-03: qty 15

## 2020-07-03 MED ORDER — FLUCONAZOLE 100 MG PO TABS
100.0000 mg | ORAL_TABLET | Freq: Every day | ORAL | Status: DC
  Administered 2020-07-03 – 2020-07-05 (×3): 100 mg via ORAL
  Filled 2020-07-03 (×3): qty 1

## 2020-07-03 MED ORDER — LIDOCAINE HCL (PF) 4 % IJ SOLN
INTRAMUSCULAR | Status: AC
  Filled 2020-07-03: qty 5

## 2020-07-03 MED ORDER — FAMCICLOVIR 500 MG PO TABS
500.0000 mg | ORAL_TABLET | Freq: Every day | ORAL | Status: DC
  Administered 2020-07-03 – 2020-07-05 (×3): 500 mg via ORAL
  Filled 2020-07-03 (×3): qty 1

## 2020-07-03 MED ORDER — HYDROCODONE-HOMATROPINE 5-1.5 MG/5ML PO SYRP: 5.0000 mL | ORAL_SOLUTION | Freq: Four times a day (QID) | ORAL | Status: DC | PRN

## 2020-07-03 MED ORDER — LIDOCAINE HCL 1 % IJ SOLN
INTRAMUSCULAR | Status: AC
  Filled 2020-07-03: qty 1

## 2020-07-03 MED ORDER — LIDOCAINE HCL 1 % IJ SOLN
INTRAMUSCULAR | Status: DC | PRN
  Administered 2020-07-03: 6 mL

## 2020-07-03 MED ORDER — MIDAZOLAM HCL (PF) 5 MG/ML IJ SOLN
INTRAMUSCULAR | Status: AC
  Filled 2020-07-03: qty 2

## 2020-07-03 MED ORDER — LIDOCAINE VISCOUS HCL 2 % MT SOLN
OROMUCOSAL | Status: AC
  Filled 2020-07-03: qty 30

## 2020-07-03 MED ORDER — LIDOCAINE HCL 1 % IJ SOLN
15.0000 mL | Freq: Once | INTRAMUSCULAR | Status: AC
  Administered 2020-07-03: 15 mL

## 2020-07-03 MED ORDER — MIDAZOLAM HCL (PF) 10 MG/2ML IJ SOLN
INTRAMUSCULAR | Status: DC | PRN
  Administered 2020-07-03 (×4): 1 mg via INTRAVENOUS

## 2020-07-03 MED ORDER — LIDOCAINE HCL URETHRAL/MUCOSAL 2 % EX GEL
CUTANEOUS | Status: AC
  Filled 2020-07-03: qty 30

## 2020-07-03 MED ORDER — SULFAMETHOXAZOLE-TRIMETHOPRIM 800-160 MG PO TABS
2.0000 | ORAL_TABLET | Freq: Three times a day (TID) | ORAL | Status: DC
  Administered 2020-07-03 – 2020-07-04 (×3): 2 via ORAL
  Filled 2020-07-03 (×3): qty 2

## 2020-07-03 MED ORDER — PHENYLEPHRINE HCL 1 % NA SOLN: 1.0000 [drp] | Freq: Four times a day (QID) | NASAL | Status: DC | PRN

## 2020-07-03 MED ORDER — LACTATED RINGERS IV SOLN: Freq: Once | INTRAVENOUS | Status: AC

## 2020-07-03 MED ORDER — LIDOCAINE HCL (PF) 4 % IJ SOLN
5.0000 mL | Freq: Once | INTRAMUSCULAR | Status: AC
  Administered 2020-07-03: 5 mL

## 2020-07-03 MED ORDER — SODIUM BICARBONATE/SODIUM CHLORIDE MOUTHWASH
1.0000 "application " | OROMUCOSAL | Status: DC
  Administered 2020-07-03 – 2020-07-05 (×12): 1 via OROMUCOSAL
  Filled 2020-07-03: qty 1000

## 2020-07-03 MED ORDER — LIDOCAINE HCL URETHRAL/MUCOSAL 2 % EX GEL
CUTANEOUS | Status: DC | PRN
  Administered 2020-07-03: 1

## 2020-07-03 MED ORDER — FENTANYL CITRATE (PF) 100 MCG/2ML IJ SOLN
INTRAMUSCULAR | Status: AC
  Filled 2020-07-03: qty 4

## 2020-07-03 NOTE — Telephone Encounter (Signed)
No los 12/1 Direct Admit from office

## 2020-07-03 NOTE — Progress Notes (Addendum)
ANTICOAGULATION CONSULT NOTE - Follow Up Consult  Pharmacy Consult for Heparin  Indication: pulmonary embolus  No Known Allergies  Patient Measurements: Height: 5\' 9"  (175.3 cm) Weight: 87.1 kg (192 lb) IBW/kg (Calculated) : 70.7 Heparin Dosing Weight: 87 kg  Vital Signs: Temp: 98.5 F (36.9 C) (12/02 0400) Temp Source: Oral (12/02 0400) BP: 107/90 (12/02 0600) Pulse Rate: 99 (12/02 0600)  Labs: Recent Labs    07/02/20 1135 07/02/20 2304 07/03/20 0515  HGB 9.8*  --  8.8*  HCT 30.8*  --  28.3*  PLT 386  --  327  HEPARINUNFRC  --  0.20*  --   CREATININE 1.09  --  0.82    Estimated Creatinine Clearance: 125.7 mL/min (by C-G formula based on SCr of 0.82 mg/dL).   Medications:  Infusions:  . sodium chloride Stopped (07/02/20 1915)  . ceFEPime (MAXIPIME) IV Stopped (07/03/20 0143)  . heparin 1,600 Units/hr (07/03/20 0600)  . vancomycin      Assessment: 68 yoM admitted on 12/1 from [facility] office due to CT scan + bilateral pulmonary emboli.  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  No prior to admission anticoagulation.  Pharmacy is consulted to dose heparin IV.  Today, 07/03/2020: Heparin level 0.42, therapeutic, on heparin 1600 units/hr CBC:  Hgb 8.8, Plt wnl No bleeding or complications reported by RN   Goal of Therapy:  Heparin level 0.3-0.7 units/ml Monitor platelets by anticoagulation protocol: Yes   Plan:  Continue heparin IV infusion at 1600 units/hr Recheck confirmatory HL in 6 hours. Daily heparin level and CBC Check LE Dopplers   Gretta Arab PharmD, BCPS Clinical Pharmacist [facility] main pharmacy [phone] 07/03/2020 7:18 AM    Addendum: Confirmatory heparin level 0.34, remains therapeutic on heparin.  He is currently undergoing a bronchoscopy (heparin is not being held d/t no plan for biopsy). Plan: Continue heparin IV infusion at 1600 units/hr Daily heparin level and  CBC  Gretta Arab PharmD, BCPS Clinical Pharmacist [facility] main pharmacy [phone] 07/03/2020 2:12 PM

## 2020-07-03 NOTE — TOC Initial Note (Signed)
Transition of Care [facility]) - Initial/Assessment Note    Patient Details  Name: Travis Duran MRN: [id number] Date of Birth: 1975-11-28  Transition of [facility]) CM/SW Contact:    Leeroy Cha, RN Phone Number: 07/03/2020, 7:53 AM  Clinical Narrative:                  44 y.o. male with medical history significant for diffuse large B-cell lymphoma currently on chemotherapy with R-EPOCH, OSA who presented to the ED from the cancer clinic for evaluation of hypoxia.  Patient follows with oncology, Dr. Marin Olp. He is between cycles 3 and 4 of R-EPOCH. He developed shortness of breath which has been worse with exertion and frequent nonproductive cough last week. He had a chest x-ray performed on 06/30/2020 which showed increasing hazy and patchy opacities in the lower lungs. He has been on empiric antibiotics with ciprofloxacin. He has also been on Famvir and Diflucan prophylactically.  He was seen again by his oncologist earlier today 07/02/2020 and was found to be hypoxic and tachycardic. CTA chest PE study was performed and did show pulmonary emboli in the posterior segment lower lobe pulmonary artery branches bilaterally without central pulmonary embolus or right heart strain. He was noted to have widespread airspace opacities throughout the lungs with consolidation in the lung bases concerning for atypical pneumonia or hypersensitivity/chemotherapy associated pneumonitis.  Patient was sent to [place] ED for further evaluation.  Patient otherwise denies any subjective fevers, chills, diaphoresis, chest pain, abdominal pain, diarrhea, dysuria, lower extremity swelling. Plan is to return to home, has pcp /following for progression Expected Discharge Plan: Home/Self Care Barriers to Discharge: No Barriers Identified   Patient Goals and CMS Choice Patient states their goals for this hospitalization and ongoing recovery are:: to go home CMS Medicare.gov Compare Post Acute Care list  provided to:: Patient Choice offered to / list presented to : Patient  Expected Discharge Plan and Services Expected Discharge Plan: Home/Self Care   Discharge Planning Services: CM Consult   Living arrangements for the past 2 months: Single Family Home                                      Prior Living Arrangements/Services Living arrangements for the past 2 months: Single Family Home Lives with:: Spouse Patient language and need for interpreter reviewed:: Yes Do you feel safe going back to the place where you live?: Yes      Need for Family Participation in Patient Care: Yes (Comment) Care giver support system in place?: Yes (comment)   Criminal Activity/Legal Involvement Pertinent to Current Situation/Hospitalization: No - Comment as needed  Activities of Daily Living      Permission Sought/Granted                  Emotional Assessment Appearance:: Appears stated age Attitude/Demeanor/Rapport: Engaged Affect (typically observed): Calm Orientation: : Oriented to Place, Oriented to Self, Oriented to  Time, Oriented to Situation Alcohol / Substance Use: Not Applicable Psych Involvement: Yes (comment)  Admission diagnosis:  Acute respiratory failure with hypoxia ([place]) [J96.01] Patient Active Problem List   Diagnosis Date Noted  . Pulmonary embolism ([place]) 07/02/2020  . Acute respiratory failure with hypoxia ([place]) 07/02/2020  . Cough 05/15/2020  . Encounter for antineoplastic chemotherapy 05/02/2020  . Diffuse large B cell lymphoma ([place]) 05/02/2020  . Lymphoma, large cell, intrathoracic lymph nodes ([place]) 05/01/2020  .  History of thoracentesis   . History of pleural effusion   . OBSTRUCTIVE SLEEP APNEA 04/16/2010  . ERECTILE DYSFUNCTION 08/25/2007   PCP:  Laurey Morale, MD Pharmacy:   [id number] - [place], [place] [address] Phone: [phone] Fax: [place], [place] - [place] [address] [place] [address] Phone: [phone] Fax: [phone]  [facility] [place], [place] - [place] [facility] [address] AT [address] [place] [facility] [place] [address] Phone: [phone] Fax: [phone]     Social Determinants of Health (SDOH) Interventions    Readmission Risk Interventions No flowsheet data found.

## 2020-07-03 NOTE — Progress Notes (Signed)
Bilateral lower extremity venous duplex has been completed. Preliminary results can be found in CV Proc through chart review.   07/03/20 9:05 AM Travis Duran RVT

## 2020-07-03 NOTE — Op Note (Signed)
[facility] Cardiopulmonary Patient Name: Travis Duran Procedure Date: 07/03/2020 MRN: [id number] Attending MD: Senaida Ores. Shearon Stalls MD, MD Date of Birth: Jul 09, 1976 CSN: [id number] Age: 43 Admit Type: Inpatient Ethnicity: Not Hispanic or Latino Procedure:             Bronchoscopy Indications:           Diffuse lung disease Providers:             Senaida Ores. Shearon Stalls MD, MD, Cleda Daub, RN, Erenest Rasher, RN, Elspeth Cho Tech., Technician Referring MD:           Medicines:             Lidocaine 1% applied to cords 4 mL, Lidocaine applied                         to nares and subglottic space, fentanyl 150 mcg, 48m                         versed Complications:         No immediate complications Estimated Blood Loss:  none                        Estimated blood loss: none. Procedure:      Pre-Anesthesia Assessment:      - A History and Physical has been performed. The patient's medications,       allergies and sensitivities have been reviewed.      - The risks and benefits of the procedure and the sedation options and       risks were discussed with the patient. All questions were answered and       informed consent was obtained.      - Airway Examination: normal oropharyngeal airway and Mallampati Class       IV (tongue obstructs view of the soft palate).      After obtaining informed consent, the bronchoscope was passed under       direct vision through the mouth. Was unable to pass scope through       bilateral nares. Throughout the procedure, the patient's blood pressure,       pulse, and oxygen saturations were monitored continuously. the BF-H190       (([id number] Olympus Bronchoscope was introduced through the mouth, via       face mask and advanced to the tracheobronchial tree. The procedure was       accomplished without difficulty. The patient tolerated the procedure       well. Findings:      Normal airways.      No endobronchial lesions  or secretions.      The oropharynx appears normal. The larynx appears normal. The vocal       cords appear normal. The subglottic space is normal. The trachea is of       normal caliber. The carina is sharp. The tracheobronchial tree was       examined to at least the first subsegmental level. Bronchial mucosa and       anatomy are normal; there are no endobronchial lesions, and no       secretions.      Bronchoalveolar lavage was performed in the RML lateral segment (B4) of  the lung and sent for cell count, bacterial culture, viral smears &       culture, fungal & AFB analysis and cytology for immunocompromised host       protocol and cell count and differential. 90 mL of fluid were instilled.       40 mL were returned. The return was cellular. There were no mucoid plugs       in the return fluid. Impression:      normal airways, diffuse ground glass opacities      - Diffuse lung disease      - The airway examination was normal.      - Bronchoalveolar lavage was performed. Moderate Sedation:      Moderate (conscious) sedation was personally administered by the       physician performing the procedure. The following parameters were       monitored: oxygen saturation, heart rate, blood pressure, and response       to care. Total physician intraservice time was 10 minutes. Recommendation:      - Await BAL results. Return to inpatient care Procedure Code(s):      --- Professional ---      [phone], Bronchoscopy, rigid or flexible, including fluoroscopic guidance,       when performed; with bronchial alveolar lavage      [id number], Moderate sedation services provided by the same physician or       other qualified health care professional performing the diagnostic or       therapeutic service that the sedation supports, requiring the presence       of an independent trained observer to assist in the monitoring of the       patient's level of consciousness and physiological status; initial  15       minutes of intraservice time, patient age 29 years or older Diagnosis Code(s):      --- Professional ---      R91.8, Other nonspecific abnormal finding of lung field CPT copyright 2019 [employer]. All rights reserved. The codes documented in this report are preliminary and upon coder review may  be revised to meet current compliance requirements. Chloeanne Poteet S. Shearon Stalls MD, MD 07/03/2020 3:04:54 PM This report has been signed electronically. Number of Addenda: 0 Scope In: Scope Out:

## 2020-07-03 NOTE — Progress Notes (Signed)
  Echocardiogram 2D Echocardiogram has been performed.  Travis Duran 07/03/2020, 11:45 AM
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
## 2020-07-03 NOTE — Progress Notes (Signed)
[facility] Progress Note  Patient: Travis Duran    WUX:[id number]  DOA: 07/02/2020     Date of Service: the patient was seen and examined on 07/03/2020  Brief hospital course: DLBCL, on R-EPOCH and Neulasta, last dose 06/23/2020 cycle 3.  Presents with complaints of cough and shortness of breath ongoing since last 1 week.  Outpatient CT PE protocol showed small PE as well as diffuse groundglass opacity with acute hypoxic respiratory failure. Initially presented at [facility] transferred to [facility] stepdown. Currently plan is further work-up and treatment for hypoxic respiratory failure.  Assessment and Plan: 1.  Acute hypoxic respiratory failure, POA Diffuse groundglass opacity Pneumonitis versus HCAP No oxygen at baseline. On presentation 74% on room air. Currently on 6 LPM. CRP is not elevated. No history of fever. Procalcitonin mildly elevated. MRSA PCR negative.  Initially started on IV vancomycin and cefepime currently on IV cefepime. Follow-up on blood cultures. Follow-up on sputum cultures. Pulmonary consulted, SP bronchoscopy.  Normal airway, diffuse groundglass opacity, diffuse lung disease. Follow-up on results of BAL. Continue to monitor in stepdown unit overnight. Echocardiogram to evaluate for CHF. BNP elevated although this can also be secondary to PE.  2.  DLBCL Continue famciclovir as well as fluconazole for prophylaxis. Received Neulasta which likely is the cause for leukocytosis. Oncology following.  3.  Acute pulmonary embolism Posterior segment lower lobe pulmonary artery PE. No RV strain. Lower extremity Doppler negative for DVT. Echocardiogram showed RV normal. Currently on IV heparin.  4.  Anemia, likely chemotherapy-induced. Hemoglobin 8.8.  On admission 9.8.  Monitor daily CBC.  No active bleeding.  Diet: low sodium diet DVT Prophylaxis: Therapeutic Anticoagulation with heparin      Advance goals of care discussion: Full  code  Family Communication: no family was present at bedside, at the time of interview.   Disposition:  Status is: Inpatient  Remains inpatient appropriate because:IV treatments appropriate due to intensity of illness or inability to take PO   Dispo: The patient is from: Home              Anticipated d/c is to: Home              Anticipated d/c date is: > 3 days              Patient currently is medically stable to d/c.  Subjective: Continues to have cough continues to have shortness of breath but no nausea no vomiting.  No fever no chills.  No chest pain.  No leg pain.  No swelling in the leg.  Physical Exam:  General: Appear in mild distress, no Rash; Oral Mucosa Clear, moist. no Abnormal Neck Mass Or lumps, Conjunctiva normal  Cardiovascular: S1 and S2 Present, no Murmur, Respiratory: good respiratory effort, Bilateral Air entry present and bilateral  Crackles, no wheezes Abdomen: Bowel Sound present, Soft and no tenderness Extremities: no Pedal edema Neurology: alert and oriented to time, place, and person affect appropriate. no new focal deficit Gait not checked due to patient safety concerns  Vitals:   07/03/20 1520 07/03/20 1530 07/03/20 1554 07/03/20 1600  BP: (!) 95/56 94/60 108/66 106/69  Pulse: 90 91 94 92  Resp: _0 Temp:      TempSrc:      SpO2: 93% 92% 98% 92%  Weight:      Height:        Intake/Output Summary (Last 24 hours) at 07/03/2020 1651 Last data filed at 07/03/2020  1246 Gross per 24 hour  Intake 717.36 ml  Output 300 ml  Net 417.36 ml   Filed Weights   07/02/20 1433 07/02/20 1446 07/03/20 1400  Weight: 27.5 kg 87.1 kg 81.6 kg    Data Reviewed: I have personally reviewed and interpreted daily labs, tele strips, imagings as discussed above. I reviewed all nursing notes, pharmacy notes, vitals, pertinent old records I have discussed plan of care as described above with RN and patient/family.  CBC: Recent Labs  Lab 07/02/20 1135  07/03/20 0515  WBC 33.6* 30.0*  NEUTROABS 22.1*  --   HGB 9.8* 8.8*  HCT 30.8* 28.3*  MCV 87.3 88.2  PLT 386 630   Basic Metabolic Panel: Recent Labs  Lab 07/02/20 1135 07/03/20 0515  NA 136 138  K 3.8 3.6  CL 103 108  CO2 25 22  GLUCOSE 111* 92  BUN 15 13  CREATININE 1.09 0.82  CALCIUM 9.4 7.9*    Studies: ECHOCARDIOGRAM COMPLETE  Result Date: 07/03/2020    ECHOCARDIOGRAM REPORT   Patient Name:   Travis Duran Date of Exam: 07/03/2020 Medical Rec #:  [id number]    Height:       69.0 in Accession #:    [id number]   Weight:       192.0 lb Date of Birth:  06/03/1976     BSA:          2.030 m Patient Age:    44 years     BP:           110/64 mmHg Patient Gender: M            HR:           94 bpm. Exam Location:  Inpatient Procedure: 2D Echo, Cardiac Doppler, Color Doppler and Strain Analysis Indications:    I26.02 Pulmonary embolus  History:        Patient has prior history of Echocardiogram examinations, most                 recent 04/27/2020. Signs/Symptoms:Dyspnea and Shortness of                 Breath; Risk Factors:Sleep Apnea. Lymphoma. Chemotherapy. Port.                 Hypoxia.  Sonographer:    Roseanna Rainbow RDCS Referring Phys: [id number] [place]  Sonographer Comments: Technically difficult study due to poor echo windows. Global longitudinal strain was attempted. Poor sound wave transmission. IMPRESSIONS  1. Left ventricular ejection fraction, by estimation, is 60 to 65%. The left ventricle has normal function. The left ventricle has no regional wall motion abnormalities. There is mild left ventricular hypertrophy. Left ventricular diastolic parameters were normal.  2. Right ventricular systolic function is normal. The right ventricular size is normal. Tricuspid regurgitation signal is inadequate for assessing PA pressure.  3. The mitral valve is normal in structure. Trivial mitral valve regurgitation. No evidence of mitral stenosis.  4. The aortic valve is tricuspid. Aortic valve  regurgitation is not visualized. No aortic stenosis is present.  5. Aortic dilatation noted. There is mild dilatation of the ascending aorta, measuring 38 mm.  6. The inferior vena cava is normal in size with greater than 50% respiratory variability, suggesting right atrial pressure of 3 mmHg. FINDINGS  Left Ventricle: Left ventricular ejection fraction, by estimation, is 60 to 65%. The left ventricle has normal function. The left ventricle has no regional wall motion abnormalities. The left ventricular  internal cavity size was normal in size. There is  mild left ventricular hypertrophy. Left ventricular diastolic parameters were normal. Right Ventricle: The right ventricular size is normal. No increase in right ventricular wall thickness. Right ventricular systolic function is normal. Tricuspid regurgitation signal is inadequate for assessing PA pressure. Left Atrium: Left atrial size was normal in size. Right Atrium: Right atrial size was normal in size. Pericardium: There is no evidence of pericardial effusion. Mitral Valve: The mitral valve is normal in structure. Trivial mitral valve regurgitation. No evidence of mitral valve stenosis. Tricuspid Valve: The tricuspid valve is normal in structure. Tricuspid valve regurgitation is trivial. No evidence of tricuspid stenosis. Aortic Valve: The aortic valve is tricuspid. Aortic valve regurgitation is not visualized. No aortic stenosis is present. Pulmonic Valve: The pulmonic valve was not well visualized. Pulmonic valve regurgitation is not visualized. No evidence of pulmonic stenosis. Aorta: Aortic dilatation noted. There is mild dilatation of the ascending aorta, measuring 38 mm. Venous: The inferior vena cava is normal in size with greater than 50% respiratory variability, suggesting right atrial pressure of 3 mmHg. IAS/Shunts: No atrial level shunt detected by color flow Doppler.  LEFT VENTRICLE PLAX 2D LVIDd:         4.19 cm      Diastology LVIDs:         2.67  cm      LV e' medial:    8.92 cm/s LV PW:         1.31 cm      LV E/e' medial:  7.9 LV IVS:        1.49 cm      LV e' lateral:   15.23 cm/s LVOT diam:     2.20 cm      LV E/e' lateral: 4.6 LV SV:         67 LV SV Index:   33 LVOT Area:     3.80 cm  LV Volumes (MOD) LV vol d, MOD A2C: 83.7 ml LV vol d, MOD A4C: 104.0 ml LV vol s, MOD A2C: 34.6 ml LV vol s, MOD A4C: 46.1 ml LV SV MOD A2C:     49.1 ml LV SV MOD A4C:     104.0 ml LV SV MOD BP:      55.6 ml RIGHT VENTRICLE            IVC RV S prime:     9.57 cm/s  IVC diam: 1.62 cm TAPSE (M-mode): 1.6 cm LEFT ATRIUM             Index       RIGHT ATRIUM          Index LA diam:        3.30 cm 1.63 cm/m  RA Area:     9.31 cm LA Vol (A2C):   34.6 ml 17.04 ml/m RA Volume:   15.40 ml 7.58 ml/m LA Vol (A4C):   27.3 ml 13.45 ml/m LA Biplane Vol: 33.0 ml 16.25 ml/m  AORTIC VALVE LVOT Vmax:   95.00 cm/s LVOT Vmean:  65.900 cm/s LVOT VTI:    0.175 m  AORTA Ao Root diam: 3.20 cm Ao Asc diam:  3.80 cm MITRAL VALVE MV Area (PHT): 4.15 cm    SHUNTS MV Decel Time: 183 msec    Systemic VTI:  0.18 m MV E velocity: 70.60 cm/s  Systemic Diam: 2.20 cm MV A velocity: 90.80 cm/s MV E/A ratio:  0.78 Kirk Ruths MD Electronically signed by  Kirk Ruths MD Signature Date/Time: 07/03/2020/2:17:08 PM    Final    VAS [place] LOWER EXTREMITY VENOUS (DVT)  Result Date: 07/03/2020  Lower Venous DVT Study Indications: Pulmonary embolism.  Risk Factors: Cancer. Comparison Study: No prior studies. Performing Technologist: Oliver Hum RVT  Examination Guidelines: A complete evaluation includes B-mode imaging, spectral Doppler, color Doppler, and power Doppler as needed of all accessible portions of each vessel. Bilateral testing is considered an integral part of a complete examination. Limited examinations for reoccurring indications may be performed as noted. The reflux portion of the exam is performed with the patient in reverse Trendelenburg.   +---------+---------------+---------+-----------+----------+--------------+ RIGHT    CompressibilityPhasicitySpontaneityPropertiesThrombus Aging +---------+---------------+---------+-----------+----------+--------------+ CFV      Full           Yes      Yes                                 +---------+---------------+---------+-----------+----------+--------------+ SFJ      Full                                                        +---------+---------------+---------+-----------+----------+--------------+ FV Prox  Full                                                        +---------+---------------+---------+-----------+----------+--------------+ FV Mid   Full                                                        +---------+---------------+---------+-----------+----------+--------------+ FV DistalFull                                                        +---------+---------------+---------+-----------+----------+--------------+ PFV      Full                                                        +---------+---------------+---------+-----------+----------+--------------+ POP      Full           Yes      Yes                                 +---------+---------------+---------+-----------+----------+--------------+ PTV      Full                                                        +---------+---------------+---------+-----------+----------+--------------+ PERO  Full                                                        +---------+---------------+---------+-----------+----------+--------------+   +---------+---------------+---------+-----------+----------+--------------+ LEFT     CompressibilityPhasicitySpontaneityPropertiesThrombus Aging +---------+---------------+---------+-----------+----------+--------------+ CFV      Full           Yes      Yes                                  +---------+---------------+---------+-----------+----------+--------------+ SFJ      Full                                                        +---------+---------------+---------+-----------+----------+--------------+ FV Prox  Full                                                        +---------+---------------+---------+-----------+----------+--------------+ FV Mid   Full                                                        +---------+---------------+---------+-----------+----------+--------------+ FV DistalFull                                                        +---------+---------------+---------+-----------+----------+--------------+ PFV      Full                                                        +---------+---------------+---------+-----------+----------+--------------+ POP      Full           Yes      Yes                                 +---------+---------------+---------+-----------+----------+--------------+ PTV      Full                                                        +---------+---------------+---------+-----------+----------+--------------+ PERO     Full                                                        +---------+---------------+---------+-----------+----------+--------------+  Summary: RIGHT: - There is no evidence of deep vein thrombosis in the lower extremity.  - No cystic structure found in the popliteal fossa.  LEFT: - There is no evidence of deep vein thrombosis in the lower extremity.  - No cystic structure found in the popliteal fossa.  *See table(s) above for measurements and observations.    Preliminary     Scheduled Meds: . antiseptic oral rinse  15 mL Mouth Rinse Q4H  . Chlorhexidine Gluconate Cloth  6 each Topical Daily  . famciclovir  500 mg Oral Daily  . fluconazole  100 mg Oral Daily  . lidocaine  15 mL Other Once  . mouth rinse  15 mL Mouth Rinse BID  . methylPREDNISolone (SOLU-MEDROL)  injection  60 mg Intravenous Q12H  . phenylephrine  1 drop Each Nare Once  . sodium bicarbonate/sodium chloride  1 application Mouth Rinse Q4H  . sodium chloride flush  3 mL Intravenous Q12H   Continuous Infusions: . sodium chloride Stopped (07/02/20 1915)  . ceFEPime (MAXIPIME) IV Stopped (07/03/20 0955)  . heparin 1,600 Units/hr (07/03/20 0744)   PRN Meds: sodium chloride, acetaminophen **OR** acetaminophen, HYDROcodone-homatropine, ondansetron **OR** ondansetron (ZOFRAN) IV, phenylephrine  Time spent: 35 minutes  Author: Berle Mull, MD [facility] 07/03/2020 4:51 PM  To reach On-call, see care teams to locate the attending and reach out via [email or web address]. Between 7PM-7AM, please contact night-coverage If you still have difficulty reaching the attending provider, please page the [facility] (Director on Call) for [facility] on amion for assistance.

## 2020-07-03 NOTE — Progress Notes (Signed)
ANTICOAGULATION CONSULT NOTE  Pharmacy Consult for Heparin Indication: pulmonary embolus  No Known Allergies  Patient Measurements: Height: 5\' 9"  (175.3 cm) Weight: 87.1 kg (192 lb) IBW/kg (Calculated) : 70.7 Heparin Dosing Weight: 87.1kg  Vital Signs: Temp: 97.6 F (36.4 C) (12/01 2100) Temp Source: Oral (12/01 2100) BP: 116/78 (12/02 0000) Pulse Rate: 102 (12/02 0000)  Labs: Recent Labs    07/02/20 1135 07/02/20 2304  HGB 9.8*  --   HCT 30.8*  --   PLT 386  --   HEPARINUNFRC  --  0.20*  CREATININE 1.09  --     Estimated Creatinine Clearance: 94.6 mL/min (by C-G formula based on SCr of 1.09 mg/dL).   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 Patient does have B cell lymphoma and was dx with a PE. Pharmacy has been asked to dose heparin at this time for PE.   07/03/2020 HL 0.2 subtherapeutic Per RN no bleeding or interruption of infusion  Goal of Therapy:  Heparin level 0.3-0.7 units/ml Monitor platelets by anticoagulation protocol: Yes   Plan:  - Heparin bolus 2000 units IV x 1 dose - increase Heparin drip to 1600 units/hr - Heparin level in ~ 6 hours  - Monitor patient for s/s of bleeding and CBC while on heparin   Dolly Rias RPh 07/03/2020, 12:22 AM

## 2020-07-03 NOTE — CV Procedure (Signed)
2D echo attempted, but patient in chair. Will try later. 
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
## 2020-07-04 ENCOUNTER — Other Ambulatory Visit: Payer: BC Managed Care – PPO

## 2020-07-04 ENCOUNTER — Encounter: Payer: Self-pay | Admitting: *Deleted

## 2020-07-04 ENCOUNTER — Inpatient Hospital Stay (HOSPITAL_COMMUNITY): Payer: BC Managed Care – PPO

## 2020-07-04 DIAGNOSIS — J9601 Acute respiratory failure with hypoxia: Secondary | ICD-10-CM | POA: Diagnosis not present

## 2020-07-04 LAB — COMPREHENSIVE METABOLIC PANEL
ALT: 29 U/L (ref 0–44)
AST: 22 U/L (ref 15–41)
Albumin: 3.1 g/dL — ABNORMAL LOW (ref 3.5–5.0)
Alkaline Phosphatase: 99 U/L (ref 38–126)
Anion gap: 10 (ref 5–15)
BUN: 17 mg/dL (ref 6–20)
CO2: 21 mmol/L — ABNORMAL LOW (ref 22–32)
Calcium: 8.7 mg/dL — ABNORMAL LOW (ref 8.9–10.3)
Chloride: 105 mmol/L (ref 98–111)
Creatinine, Ser: 1.02 mg/dL (ref 0.61–1.24)
GFR, Estimated: >60 mL/min (ref >60)
Glucose, Bld: 188 mg/dL — ABNORMAL HIGH (ref 70–99)
Potassium: 4 mmol/L (ref 3.5–5.1)
Sodium: 136 mmol/L (ref 135–145)
Total Bilirubin: 0.5 mg/dL (ref 0.3–1.2)
Total Protein: 5.8 g/dL — ABNORMAL LOW (ref 6.5–8.1)

## 2020-07-04 LAB — CBC WITH DIFFERENTIAL/PLATELET
Abs Immature Granulocytes: 4.01 10*3/uL — ABNORMAL HIGH (ref 0.00–0.07)
Basophils Absolute: 0.1 10*3/uL (ref 0.0–0.1)
Basophils Relative: 0 %
Eosinophils Absolute: 0 10*3/uL (ref 0.0–0.5)
Eosinophils Relative: 0 %
HCT: 28.9 % — ABNORMAL LOW (ref 39.0–52.0)
Hemoglobin: 9.2 g/dL — ABNORMAL LOW (ref 13.0–17.0)
Immature Granulocytes: 11 %
Lymphocytes Relative: 4 %
Lymphs Abs: 1.5 10*3/uL (ref 0.7–4.0)
MCH: 28 pg (ref 26.0–34.0)
MCHC: 31.8 g/dL (ref 30.0–36.0)
MCV: 87.8 fL (ref 80.0–100.0)
Monocytes Absolute: 0.8 10*3/uL (ref 0.1–1.0)
Monocytes Relative: 2 %
Neutro Abs: 30 10*3/uL — ABNORMAL HIGH (ref 1.7–7.7)
Neutrophils Relative %: 83 %
Platelets: 372 10*3/uL (ref 150–400)
RBC: 3.29 MIL/uL — ABNORMAL LOW (ref 4.22–5.81)
RDW: 18.6 % — ABNORMAL HIGH (ref 11.5–15.5)
WBC: 36.4 10*3/uL — ABNORMAL HIGH (ref 4.0–10.5)
nRBC: 0.1 % (ref 0.0–0.2)

## 2020-07-04 LAB — LACTATE DEHYDROGENASE: LDH: 316 U/L — ABNORMAL HIGH (ref 98–192)

## 2020-07-04 LAB — HEPARIN LEVEL (UNFRACTIONATED): Heparin Unfractionated: 0.58 IU/mL (ref 0.30–0.70)

## 2020-07-04 LAB — ACID FAST SMEAR (AFB, MYCOBACTERIA): Acid Fast Smear: NEGATIVE

## 2020-07-04 LAB — PNEUMOCYSTIS JIROVECI SMEAR BY DFA: Pneumocystis jiroveci Ag: NEGATIVE

## 2020-07-04 LAB — CYTOLOGY - NON PAP

## 2020-07-04 NOTE — Progress Notes (Signed)
NAME:  Travis Duran, MRN:  [id number], DOB:  05-Feb-1976, LOS: 2 ADMISSION DATE:  07/02/2020, CONSULTATION DATE:  12/2 REFERRING MD:  Marin Olp, CHIEF COMPLAINT:  Dyspnea   Brief History   44 y/o male diagnosed with diffuse large B cell lymphoma this year with an EBUS lymph node FNA.  At the time he had a large left pleural effusion which was lymphocyte predominant and showed atypical cells.  He started treatment in November with  R-EPOCH (rituximab, etoposide, prednisone, vincristine, cyclophosphamide, doxorubicin).   Past Medical History  Diffuse large B-cell lymphoma  Significant Hospital Events   12/1 admission  Consults:  Hematology oncology Pulmonary  Procedures:    Significant Diagnostic Tests:  CT chest 12/1: Pulmonary emboli in posterior segment lower lobes bilaterally no central pulmonary embolus noted.  No right heart strain.  Widespread diffuse bilateral airspace disease with some consolidation in the bases.  Large adenopathy in the anterior mediastinum but this is smaller than prior study.  Mass in the left lobe of the thyroid. Echocardiogram 12/2 LVEF 60 to 65%, mild left LVH, RV systolic function normal, valves unremarkable  Micro Data:  12/2 SARS-CoV-2 influenza negative 12/2 Legionella DFA pending 12/2 BAL AFB culture and smear pending 12/2 BAL fungal culture with stain pending 12/2 BAL bacterial culture pending 12/2 BAL pneumocystis smear by DFA negative 12/1 blood culture no growth to date  Antimicrobials:  12/3 cefepime >  12/2 bactrim > 12/2  Interim history/subjective:  Feels better  Objective   Blood pressure (!) 159/75, pulse (!) 108, temperature 98.2 F (36.8 C), temperature source Oral, resp. rate 19, height _0  (1.753 m), weight 84.5 kg, SpO2 97 %.        Intake/Output Summary (Last 24 hours) at 07/04/2020 1038 Last data filed at 07/04/2020 0800 Gross per 24 hour  Intake 628.73 ml  Output 1350 ml  Net -721.27 ml   Filed Weights   07/02/20  1446 07/03/20 1400 07/04/20 0500  Weight: 87.1 kg 81.6 kg 84.5 kg    Examination: General:  Resting comfortably in chair HENT: NCAT OP clear PULM: CTA B, normal effort CV: RRR, no mgr GI: BS+, soft, nontender MSK: normal bulk and tone Neuro: awake, alert, no distress, MAEW   Resolved Hospital Problem list     Assessment & Plan:  Severe acute respiratory failure with hypoxemia and bilateral infiltrates: BAL results showed no evidence of P JP, lymphocytic infiltrate noted.  Differential diagnosis broad including opportunistic infection though would have expected to see neutrophilic infiltrate.  Differential diagnosis does include G-CSF abnormal reaction, this has been reported in the literature.  His clinical syndrome of feeling poorly for several days after every injection and the timing is strongly suggestive that this is related. Continue broad-spectrum antibiotics Out of bed, walk as able Titrate off O2 to maintain O2 saturation greater than 88% Continue to monitor cultures from BAL Continue steroids as currently ordered If cultures remain negative, would still likely discharge with plans for 7 to 10 days of antibiotic coverage for possible pneumonia with tapering steroid dose Would strongly consider lowering the dose of G-CSF prior to next dose  Best practice (evaluated daily)   Per [facility]  Labs   CBC: Recent Labs  Lab 07/02/20 1135 07/03/20 0515 07/04/20 0458  WBC 33.6* 30.0* 36.4*  NEUTROABS 22.1*  --  30.0*  HGB 9.8* 8.8* 9.2*  HCT 30.8* 28.3* 28.9*  MCV 87.3 88.2 87.8  PLT 386 327 287    Basic Metabolic Panel: Recent Labs  Lab 07/02/20 1135 07/03/20 0515 07/04/20 0458  NA 136 138 136  K 3.8 3.6 4.0  CL 103 108 105  CO2 25 22 21*  GLUCOSE 111* 92 188*  BUN _0 CREATININE 1.09 0.82 1.02  CALCIUM 9.4 7.9* 8.7*   GFR: Estimated Creatinine Clearance: 92.4 mL/min (by C-G formula based on SCr of 1.02 mg/dL). Recent Labs  Lab 07/02/20 1135  07/02/20 1454 07/03/20 0515 07/04/20 0458  PROCALCITON  --  0.17  --   --   WBC 33.6*  --  30.0* 36.4*  LATICACIDVEN  --  1.2  --   --     Liver Function Tests: Recent Labs  Lab 07/02/20 1135 07/04/20 0458  AST 23 22  ALT 29 29  ALKPHOS 129* 99  BILITOT 0.3 0.5  PROT 6.3* 5.8*  ALBUMIN 3.7 3.1*   No results for input(s): LIPASE, AMYLASE in the last 168 hours. No results for input(s): AMMONIA in the last 168 hours.  ABG No results found for: PHART, PCO2ART, PO2ART, HCO3, TCO2, ACIDBASEDEF, O2SAT   Coagulation Profile: No results for input(s): INR, PROTIME in the last 168 hours.  Cardiac Enzymes: No results for input(s): CKTOTAL, CKMB, CKMBINDEX, TROPONINI in the last 168 hours.  HbA1C: No results found for: HGBA1C  CBG: No results for input(s): GLUCAP in the last 168 hours.   Critical care time: n/a    Roselie Awkward, MD [facility] Pager: [phone] Cell: [phone] If no response, call [phone]

## 2020-07-04 NOTE — Progress Notes (Signed)
Overall, Travis Duran is feeling better.  I am really impressed as to how much was done yesterday.  He had the bronchoscopy.  He had bronchial washings done.  There were no white cells seen in the washings.  He did have a pneumocystis test sent off.  This certainly could be a possibility.  He is on Bactrim right now.  I must say that I have not seen pneumocystis pneumonia for quite a while.  Maybe this is what he truly has.  He had Dopplers of his legs which were negative.  He is on heparin right now.  I would keep him on heparin for right now.  If we know that there is no more invasive studies that will be done, then I probably will switch him over to an oral agent.  He is on steroids.  His white count is 36.4.  Hemoglobin 9.2.  Platelet count 372,000.  LDH is 316.  BUN is 17 creatinine 1.02.  He feels better.  His breathing is better.  His oxygen level is in the mid 90s and his O2 requirements are coming down.  He is not as tachycardic.  He has had no nausea or vomiting.  He has had no diarrhea.  His vital signs are temperature 97.9.  Pulse 104.  Blood pressure 124/91.  Oxygen saturation is 96%.  His lungs sound clear bilaterally.  I do not hear any wheezes.  He has no tightness in the lungs.  Cardiac exam tachycardic but regular.  Abdomen is soft.  He has decent bowel sounds.  There is no fluid wave.  Extremities shows no clubbing, cyanosis or edema.  It would be very interesting to see whether that pneumocystis test shows.  I suppose this by would be the best thing that he could have.  This would be easily treatable.  Hopefully, if he continues to make improvement, we can get him home this weekend.  Again, I would keep him on the heparin for right now.  Because of his clot burden in the lungs is not that great, I do think we can probably get him on an oral agent for outpatient therapy.  I am just very appreciative of the outstanding care that he is getting from the staff in the ICU.  Travis Haw, MD  Jeneen Rinks 4:6

## 2020-07-04 NOTE — Progress Notes (Signed)
[facility] Progress Note  Patient: Travis Duran    WGY:[id number]  DOA: 07/02/2020     Date of Service: the patient was seen and examined on 07/04/2020  Brief hospital course: DLBCL, on R-EPOCH and Neulasta, last dose 06/23/2020 cycle 3.  Presents with complaints of cough and shortness of breath ongoing since last 1 week.  Outpatient CT PE protocol showed small PE as well as diffuse groundglass opacity with acute hypoxic respiratory failure. Initially presented at [facility] transferred to [facility] stepdown. Currently plan is continue hypoxic respiratory failure treatment.  Assessment and Plan: 1.  Acute hypoxic respiratory failure, POA Diffuse groundglass opacity Pneumonitis versus HCAP No oxygen at baseline. On presentation 74% on room air. Currently on 2 LPM CRP is not elevated. No history of fever. Procalcitonin mildly elevated. MRSA PCR negative.  Initially started on IV vancomycin and cefepime currently on IV cefepime. Follow-up on blood cultures. Follow-up on sputum cultures. Pulmonary consulted, SP bronchoscopy.  Normal airway, diffuse groundglass opacity, diffuse lung disease. Follow-up on results of BAL. PJP smear negative. Gram stain negative. Oxygenation improving.  Becomes severely tachycardic when mobilizing. Echocardiogram to evaluate for CHF. BNP elevated although this can also be secondary to PE.  2.  DLBCL Continue famciclovir as well as fluconazole for prophylaxis. Received Neulasta which likely is the cause for leukocytosis. Oncology following.  3.  Acute pulmonary embolism Posterior segment lower lobe pulmonary artery PE. No RV strain. Lower extremity Doppler negative for DVT. Echocardiogram showed RV normal. Currently on IV heparin.  4.  Anemia, likely chemotherapy-induced. Hemoglobin 8.8.  On admission 9.8.  Monitor daily CBC.  No active bleeding.  Diet: low sodium diet DVT Prophylaxis: Therapeutic Anticoagulation with heparin        Advance goals of care discussion: Full code  Family Communication: no family was present at bedside, at the time of interview.   Disposition:  Status is: Inpatient  Remains inpatient appropriate because:IV treatments appropriate due to intensity of illness or inability to take PO   Dispo: The patient is from: Home              Anticipated d/c is to: Home              Anticipated d/c date is: > 3 days              Patient currently is medically stable to d/c.  Subjective: No nausea no vomiting but no fever no chills.  No chest pain.  Physical Exam:  General: Appear in mild distress, no Rash; Oral Mucosa Clear, moist. no Abnormal Neck Mass Or lumps, Conjunctiva normal  Cardiovascular: S1 and S2 Present, no Murmur, Respiratory: increased respiratory effort, Bilateral Air entry present and bilateral  Crackles, no wheezes Abdomen: Bowel Sound present, Soft and no tenderness Extremities: no Pedal edema Neurology: alert and oriented to time, place, and person affect appropriate. no new focal deficit Gait not checked due to patient safety concerns  Vitals:   07/04/20 1707 07/04/20 1708 07/04/20 1713 07/04/20 1820  BP:   116/66 125/88  Pulse:    (!) 105  Resp: (!) 21 (!) 24 (!) 23 18  Temp:   98.4 F (36.9 C) 98 F (36.7 C)  TempSrc:    Oral  SpO2:    100%  Weight:      Height:        Intake/Output Summary (Last 24 hours) at 07/04/2020 1905 Last data filed at 07/04/2020 1800 Gross per 24 hour  Intake  1471.61 ml  Output 1600 ml  Net -128.39 ml   Filed Weights   07/02/20 1446 07/03/20 1400 07/04/20 0500  Weight: 87.1 kg 81.6 kg 84.5 kg    Data Reviewed: I have personally reviewed and interpreted daily labs, tele strips, imagings as discussed above. I reviewed all nursing notes, pharmacy notes, vitals, pertinent old records I have discussed plan of care as described above with RN and patient/family.  CBC: Recent Labs  Lab 07/02/20 1135 07/03/20 0515  07/04/20 0458  WBC 33.6* 30.0* 36.4*  NEUTROABS 22.1*  --  30.0*  HGB 9.8* 8.8* 9.2*  HCT 30.8* 28.3* 28.9*  MCV 87.3 88.2 87.8  PLT 386 327 482   Basic Metabolic Panel: Recent Labs  Lab 07/02/20 1135 07/03/20 0515 07/04/20 0458  NA 136 138 136  K 3.8 3.6 4.0  CL 103 108 105  CO2 25 22 21*  GLUCOSE 111* 92 188*  BUN _0 CREATININE 1.09 0.82 1.02  CALCIUM 9.4 7.9* 8.7*    Studies: DG Chest Port 1 View  Result Date: 07/04/2020 CLINICAL DATA:  Pneumonia, follow-up, history lymphoma, pulmonary embolism EXAM: PORTABLE CHEST 1 VIEW COMPARISON:  Portable exam 0828 hours compared to 06/30/2020 FINDINGS: RIGHT jugular Port-A-Cath with tip projecting over cavoatrial junction. Normal heart size, mediastinal contours, and pulmonary vascularity. Mild bibasilar atelectasis. Remaining lungs clear. No acute infiltrate, pleural effusion or pneumothorax. IMPRESSION: Mild bibasilar atelectasis. Electronically Signed   By: Lavonia Dana M.D.   On: 07/04/2020 10:08    Scheduled Meds: . antiseptic oral rinse  15 mL Mouth Rinse Q4H  . Chlorhexidine Gluconate Cloth  6 each Topical Daily  . famciclovir  500 mg Oral Daily  . fluconazole  100 mg Oral Daily  . mouth rinse  15 mL Mouth Rinse BID  . methylPREDNISolone (SOLU-MEDROL) injection  60 mg Intravenous Q12H  . sodium bicarbonate/sodium chloride  1 application Mouth Rinse Q4H  . sodium chloride flush  3 mL Intravenous Q12H   Continuous Infusions: . sodium chloride Stopped (07/02/20 1915)  . ceFEPime (MAXIPIME) IV Stopped (07/04/20 1836)  . heparin 1,600 Units/hr (07/04/20 1518)   PRN Meds: sodium chloride, acetaminophen **OR** acetaminophen, HYDROcodone-homatropine, ondansetron **OR** ondansetron (ZOFRAN) IV, phenylephrine  Time spent: 35 minutes  Author: Berle Mull, MD [facility] 07/04/2020 7:05 PM  To reach On-call, see care teams to locate the attending and reach out via [email or web address]. Between 7PM-7AM, please contact  night-coverage If you still have difficulty reaching the attending provider, please page the [facility] (Director on Call) for [facility] on amion for assistance.

## 2020-07-04 NOTE — Progress Notes (Signed)
Patient transferred from ICU. Alert and Oriented x 4. RN agrees with previous assessment. Vital signs stable.

## 2020-07-04 NOTE — Progress Notes (Signed)
Patient for transfer to Tele, Muse score in yellow range.  HR 110, resp. Mid to upper 20's.  Have been weaning o2 today now on 1 liter with SATS 95 - 98%.  Up in chair all day, amb in room and in halls .  HR up to 140 with activity however SATS remain up.  Dr Posey Pronto made aware.Marland KitchenMarland KitchenOK to transfer.

## 2020-07-04 NOTE — TOC Progression Note (Signed)
Transition of Care [facility]) - Progression Note    Patient Details  Name: Travis Duran MRN: [id number] Date of Birth: 15-Oct-1975  Transition of Care [facility]) CM/SW Contact  Leeroy Cha, RN Phone Number: 07/04/2020, 7:45 AM  Clinical Narrative:    Had bronchoscopy on 120221   Expected Discharge Plan: Home/Self Care Barriers to Discharge: No Barriers Identified  Expected Discharge Plan and Services Expected Discharge Plan: Home/Self Care   Discharge Planning Services: CM Consult   Living arrangements for the past 2 months: Single Family Home                                       Social Determinants of Health (SDOH) Interventions    Readmission Risk Interventions No flowsheet data found.

## 2020-07-04 NOTE — Progress Notes (Signed)
ANTICOAGULATION CONSULT NOTE - Follow Up Consult  Pharmacy Consult for Heparin  Indication: pulmonary embolus  No Known Allergies  Patient Measurements: Height: 5\' 9"  (175.3 cm) Weight: 84.5 kg (186 lb 4.6 oz) IBW/kg (Calculated) : 70.7 Heparin Dosing Weight: n/a. Use TBW = 84 kg  Vital Signs: Temp: 97.9 F (36.6 C) (12/03 0400) Temp Source: Oral (12/03 0400) BP: 124/91 (12/03 0600) Pulse Rate: 104 (12/03 0600)  Labs: Recent Labs    07/02/20 1135 07/02/20 2304 07/03/20 0515 07/03/20 0700 07/03/20 1312 07/04/20 0458  HGB 9.8*  --  8.8*  --   --  9.2*  HCT 30.8*  --  28.3*  --   --  28.9*  PLT 386  --  327  --   --  372  HEPARINUNFRC  --    < >  --  0.42 0.34 0.58  CREATININE 1.09  --  0.82  --   --  1.02   < > = values in this interval not displayed.    Estimated Creatinine Clearance: 92.4 mL/min (by C-G formula based on SCr of 1.02 mg/dL).   Medications:  Infusions:  . sodium chloride Stopped (07/02/20 1915)  . ceFEPime (MAXIPIME) IV Stopped (07/04/20 0227)  . heparin 1,600 Units/hr (07/04/20 0500)    Assessment: 61 yoM admitted on 12/1 from [facility] office due to CT scan + bilateral pulmonary emboli.  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  No prior to admission anticoagulation.  Pharmacy is consulted to dose heparin IV.  Today, 07/04/2020:  HL = 0.58 remains therapeutic on heparin infusion of 1600 units/hr  CBC: Hgb (9.2) low but stable; Plt WNL & stable  Confirmed with RN that heparin infusing at correct rate. No signs of bleeding.  Goal of Therapy:  Heparin level 0.3-0.7 units/ml Monitor platelets by anticoagulation protocol: Yes   Plan:   Continue heparin infusion at current rate of 1600 units/hr  Daily HL, CBC while on heparin infusion  Monitor for signs of bleeding  Lenis Noon, PharmD 07/04/20 8:30 AM

## 2020-07-04 NOTE — Progress Notes (Signed)
Per Dr Marin Olp we will plan on admission for next cycle on 07/14/20 unless patient condition changes. Will follow up next week to schedule planned admission.  Oncology Nurse Navigator Documentation  Oncology Nurse Navigator Flowsheets 07/04/2020  Abnormal Finding Date -  Confirmed Diagnosis Date -  Diagnosis Status -  Planned Course of Treatment -  Phase of Treatment -  Chemotherapy Actual Start Date: -  Navigator Follow Up Date: 07/09/2020  Navigator Follow Up Reason: Other:  Production assistant, radio Encounter Type Appt/Treatment Plan Review;Telephone  Telephone -  Treatment Initiated Date -  Patient Visit Type MedOnc  Treatment Phase Active Tx  Barriers/Navigation Needs Coordination of Care;Education  Education -  Interventions -  Acuity Level 3-Moderate Needs (3-4 Barriers Identified)  Coordination of Care -  Education Method -  Support Groups/Services Friends and Family  Time Spent with Patient 30

## 2020-07-05 DIAGNOSIS — C8332 Diffuse large B-cell lymphoma, intrathoracic lymph nodes: Secondary | ICD-10-CM

## 2020-07-05 DIAGNOSIS — J9601 Acute respiratory failure with hypoxia: Secondary | ICD-10-CM | POA: Diagnosis not present

## 2020-07-05 DIAGNOSIS — I2699 Other pulmonary embolism without acute cor pulmonale: Principal | ICD-10-CM

## 2020-07-05 LAB — COMPREHENSIVE METABOLIC PANEL
ALT: 34 U/L (ref 0–44)
AST: 22 U/L (ref 15–41)
Albumin: 3 g/dL — ABNORMAL LOW (ref 3.5–5.0)
Alkaline Phosphatase: 105 U/L (ref 38–126)
Anion gap: 11 (ref 5–15)
BUN: 22 mg/dL — ABNORMAL HIGH (ref 6–20)
CO2: 20 mmol/L — ABNORMAL LOW (ref 22–32)
Calcium: 8.7 mg/dL — ABNORMAL LOW (ref 8.9–10.3)
Chloride: 105 mmol/L (ref 98–111)
Creatinine, Ser: 1.05 mg/dL (ref 0.61–1.24)
GFR, Estimated: >60 mL/min (ref >60)
Glucose, Bld: 163 mg/dL — ABNORMAL HIGH (ref 70–99)
Potassium: 4.2 mmol/L (ref 3.5–5.1)
Sodium: 136 mmol/L (ref 135–145)
Total Bilirubin: 0.6 mg/dL (ref 0.3–1.2)
Total Protein: 5.5 g/dL — ABNORMAL LOW (ref 6.5–8.1)

## 2020-07-05 LAB — CBC WITH DIFFERENTIAL/PLATELET
Abs Immature Granulocytes: 5.15 10*3/uL — ABNORMAL HIGH (ref 0.00–0.07)
Basophils Absolute: 0.1 10*3/uL (ref 0.0–0.1)
Basophils Relative: 0 %
Eosinophils Absolute: 0 10*3/uL (ref 0.0–0.5)
Eosinophils Relative: 0 %
HCT: 27.4 % — ABNORMAL LOW (ref 39.0–52.0)
Hemoglobin: 8.8 g/dL — ABNORMAL LOW (ref 13.0–17.0)
Immature Granulocytes: 10 %
Lymphocytes Relative: 4 %
Lymphs Abs: 1.9 10*3/uL (ref 0.7–4.0)
MCH: 28.5 pg (ref 26.0–34.0)
MCHC: 32.1 g/dL (ref 30.0–36.0)
MCV: 88.7 fL (ref 80.0–100.0)
Monocytes Absolute: 1.7 10*3/uL — ABNORMAL HIGH (ref 0.1–1.0)
Monocytes Relative: 3 %
Neutro Abs: 45.2 10*3/uL — ABNORMAL HIGH (ref 1.7–7.7)
Neutrophils Relative %: 83 %
Platelets: 397 10*3/uL (ref 150–400)
RBC: 3.09 MIL/uL — ABNORMAL LOW (ref 4.22–5.81)
RDW: 19 % — ABNORMAL HIGH (ref 11.5–15.5)
WBC: 54 10*3/uL (ref 4.0–10.5)
nRBC: 0.1 % (ref 0.0–0.2)

## 2020-07-05 LAB — HEPARIN LEVEL (UNFRACTIONATED): Heparin Unfractionated: 0.42 IU/mL (ref 0.30–0.70)

## 2020-07-05 LAB — LACTATE DEHYDROGENASE: LDH: 282 U/L — ABNORMAL HIGH (ref 98–192)

## 2020-07-05 MED ORDER — HEPARIN SOD (PORK) LOCK FLUSH 100 UNIT/ML IV SOLN: 500.0000 [IU] | INTRAVENOUS | Status: DC

## 2020-07-05 MED ORDER — METHYLPREDNISOLONE SODIUM SUCC 125 MG IJ SOLR: 60.0000 mg | Freq: Every day | INTRAMUSCULAR | Status: DC

## 2020-07-05 MED ORDER — PREDNISONE 5 MG PO TABS: ORAL_TABLET | ORAL | 0 refills | Status: DC

## 2020-07-05 MED ORDER — CIPROFLOXACIN HCL 500 MG PO TABS: 500.0000 mg | ORAL_TABLET | Freq: Every day | ORAL | 5 refills | Status: DC

## 2020-07-05 MED ORDER — HEPARIN SOD (PORK) LOCK FLUSH 100 UNIT/ML IV SOLN: 500.0000 [IU] | INTRAVENOUS | Status: DC | PRN

## 2020-07-05 MED ORDER — APIXABAN 5 MG PO TABS
10.0000 mg | ORAL_TABLET | Freq: Two times a day (BID) | ORAL | Status: DC
  Administered 2020-07-05: 10 mg via ORAL
  Filled 2020-07-05: qty 2

## 2020-07-05 MED ORDER — LEVOFLOXACIN 750 MG PO TABS: 750.0000 mg | ORAL_TABLET | Freq: Every day | ORAL | 0 refills | Status: AC

## 2020-07-05 MED ORDER — APIXABAN 5 MG PO TABS: 5.0000 mg | ORAL_TABLET | Freq: Two times a day (BID) | ORAL | Status: DC

## 2020-07-05 MED ORDER — APIXABAN (ELIQUIS) VTE STARTER PACK (10MG AND 5MG): ORAL_TABLET | ORAL | 0 refills | Status: DC

## 2020-07-05 NOTE — Progress Notes (Signed)
ANTICOAGULATION CONSULT NOTE - Follow Up Consult  Pharmacy Consult for Heparin  Indication: pulmonary embolus  No Known Allergies  Patient Measurements: Height: 5\' 9"  (175.3 cm) Weight: 84.5 kg (186 lb 4.6 oz) IBW/kg (Calculated) : 70.7 Heparin Dosing Weight: n/a. Use TBW = 84 kg  Vital Signs: Temp: 98 F (36.7 C) (12/04 0331) Temp Source: Oral (12/04 0331) BP: 117/78 (12/04 0331) Pulse Rate: 91 (12/04 0331)  Labs: Recent Labs    07/02/20 1135 07/02/20 2304 07/03/20 0515 07/03/20 0515 07/03/20 0700 07/03/20 1312 07/04/20 0458 07/05/20 0445  HGB 9.8*  --  8.8*   < >  --   --  9.2* 8.8*  HCT 30.8*  --  28.3*  --   --   --  28.9* 27.4*  PLT 386  --  327  --   --   --  372 397  HEPARINUNFRC  --    < >  --   --    < > 0.34 0.58 0.42  CREATININE 1.09  --  0.82  --   --   --  1.02  --    < > = values in this interval not displayed.    Estimated Creatinine Clearance: 92.4 mL/min (by C-G formula based on SCr of 1.02 mg/dL).   Medications:  Infusions:  . sodium chloride Stopped (07/02/20 1915)  . ceFEPime (MAXIPIME) IV 2 g (07/05/20 0135)  . heparin 1,600 Units/hr (07/04/20 1518)    Assessment: 69 yoM admitted on 12/1 from [facility] office due to CT scan + bilateral pulmonary emboli.  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  No prior to admission anticoagulation.  Pharmacy is consulted to dose heparin IV.  Today, 07/05/2020:  HL = 0.42 remains therapeutic on heparin infusion of 1600 units/hr  CBC: Hgb (8.8) low but stable; Plt WNL & stable  Confirmed with RN that heparin infusing at correct rate. No signs of bleeding.  Goal of Therapy:  Heparin level 0.3-0.7 units/ml Monitor platelets by anticoagulation protocol: Yes   Plan:   Continue heparin infusion at current rate of 1600 units/hr  Daily HL, CBC while on heparin infusion  Monitor for signs of bleeding  Netta Cedars, PharmD 07/05/20 @5 :42  AM

## 2020-07-05 NOTE — Progress Notes (Addendum)
NAME:  Travis Duran, MRN:  [id number], DOB:  [birth year], LOS: 3 ADMISSION DATE:  07/02/2020, CONSULTATION DATE:  12/2 REFERRING MD:  Marin Olp, CHIEF COMPLAINT:  Dyspnea   Brief History   44 y/o male diagnosed with diffuse large B cell lymphoma this year with an EBUS lymph node FNA.  At the time he had a large left pleural effusion which was lymphocyte predominant and showed atypical cells.  He started treatment in November with  R-EPOCH (rituximab, etoposide, prednisone, vincristine, cyclophosphamide, doxorubicin).   Past Medical History  Diffuse large B-cell lymphoma  Significant Hospital Events   12/1 admission  Consults:  Hematology oncology Pulmonary  Procedures:    Significant Diagnostic Tests:  CT chest 12/1: Pulmonary emboli in posterior segment lower lobes bilaterally no central pulmonary embolus noted.  No right heart strain.  Widespread diffuse bilateral airspace disease with some consolidation in the bases.  Large adenopathy in the anterior mediastinum but this is smaller than prior study.  Mass in the left lobe of the thyroid. Echocardiogram 12/2 LVEF 60 to 65%, mild left LVH, RV systolic function normal, valves unremarkable  Micro Data:  12/2 SARS-CoV-2 influenza negative 12/2 Legionella DFA pending 12/2 BAL AFB culture and smear pending 12/2 BAL fungal culture with stain pending 12/2 BAL bacterial culture pending 12/2 BAL pneumocystis smear by DFA negative 12/1 blood culture no growth to date  Antimicrobials:  12/3 cefepime >  12/2 bactrim > 12/2  Interim history/subjective:   Feeling better  on room air Appetite OK  Objective   Blood pressure 115/80, pulse 98, temperature 98.1 F (36.7 C), temperature source Oral, resp. rate 18, height _0  (1.753 m), weight 83.4 kg, SpO2 100 %.        Intake/Output Summary (Last 24 hours) at 07/05/2020 1310 Last data filed at 07/05/2020 0900 Gross per 24 hour  Intake 785.83 ml  Output 550 ml  Net 235.83 ml    Filed Weights   07/03/20 1400 07/04/20 0500 07/05/20 0500  Weight: 81.6 kg 84.5 kg 83.4 kg    Examination:  General:  Resting comfortably in bed HENT: NCAT OP clear PULM: CTA B, normal effort CV: RRR, no mgr GI: BS+, soft, nontender MSK: normal bulk and tone Neuro: awake, alert, no distress, MAEW    Resolved Hospital Problem list     Assessment & Plan:  Severe acute respiratory failure with hypoxemia and bilateral infiltrates: BAL results showed no evidence of P JP, lymphocytic infiltrate noted.  See discussion from 12/3 note Feel that this most likely represents G-CSF toxicity OK for hospital discharge Would discharge on prednisone 45m daily x 5 days, then 529mdaily x 5 days then stop D/c home on Levaquin 750 daily  X 5 days  OK to cancel 12/13 pulmonary follow up visit.  Follow up if worsening.  PCCM will sign off  Best practice (evaluated daily)   Per [facility]  Labs   CBC: Recent Labs  Lab 07/02/20 1135 07/03/20 0515 07/04/20 0458 07/05/20 0445  WBC 33.6* 30.0* 36.4* 54.0*  NEUTROABS 22.1*  --  30.0* 45.2*  HGB 9.8* 8.8* 9.2* 8.8*  HCT 30.8* 28.3* 28.9* 27.4*  MCV 87.3 88.2 87.8 88.7  PLT 386 327 372 [id number]  Basic Metabolic Panel: Recent Labs  Lab 07/02/20 1135 07/03/20 0515 07/04/20 0458 07/05/20 0445  NA 136 138 136 136  K 3.8 3.6 4.0 4.2  CL 103 108 105 105  CO2 25 22 21* 20*  GLUCOSE 111* 92 188* 163*  BUN _0 22*  CREATININE 1.09 0.82 1.02 1.05  CALCIUM 9.4 7.9* 8.7* 8.7*   GFR: Estimated Creatinine Clearance: 89.8 mL/min (by C-G formula based on SCr of 1.05 mg/dL). Recent Labs  Lab 07/02/20 1135 07/02/20 1454 07/03/20 0515 07/04/20 0458 07/05/20 0445  PROCALCITON  --  0.17  --   --   --   WBC 33.6*  --  30.0* 36.4* 54.0*  LATICACIDVEN  --  1.2  --   --   --     Liver Function Tests: Recent Labs  Lab 07/02/20 1135 07/04/20 0458 07/05/20 0445  AST _1 ALT 29 29 34  ALKPHOS 129* 99 105  BILITOT 0.3 0.5 0.6   PROT 6.3* 5.8* 5.5*  ALBUMIN 3.7 3.1* 3.0*   No results for input(s): LIPASE, AMYLASE in the last 168 hours. No results for input(s): AMMONIA in the last 168 hours.  ABG No results found for: PHART, PCO2ART, PO2ART, HCO3, TCO2, ACIDBASEDEF, O2SAT   Coagulation Profile: No results for input(s): INR, PROTIME in the last 168 hours.  Cardiac Enzymes: No results for input(s): CKTOTAL, CKMB, CKMBINDEX, TROPONINI in the last 168 hours.  HbA1C: No results found for: HGBA1C  CBG: No results for input(s): GLUCAP in the last 168 hours.   Critical care time: n/a    Roselie Awkward, MD [facility] Pager: [phone] Cell: [phone] If no response, call [phone]

## 2020-07-05 NOTE — Progress Notes (Signed)
ANTICOAGULATION CONSULT NOTE - Follow Up Consult  Pharmacy Consult for Heparin >> apixaban  Indication: pulmonary embolus  No Known Allergies  Patient Measurements: Height: 5\' 9"  (175.3 cm) Weight: 83.4 kg (183 lb 12.8 oz) IBW/kg (Calculated) : 70.7 Heparin Dosing Weight: n/a. Use TBW = 84 kg  Vital Signs: Temp: 98 F (36.7 C) (12/04 0331) Temp Source: Oral (12/04 0331) BP: 117/78 (12/04 0331) Pulse Rate: 91 (12/04 0331)  Labs: Recent Labs    07/03/20 0515 07/03/20 0515 07/03/20 0700 07/03/20 1312 07/04/20 0458 07/05/20 0445  HGB 8.8*   < >  --   --  9.2* 8.8*  HCT 28.3*  --   --   --  28.9* 27.4*  PLT 327  --   --   --  372 397  HEPARINUNFRC  --   --    < > 0.34 0.58 0.42  CREATININE 0.82  --   --   --  1.02 1.05   < > = values in this interval not displayed.    Estimated Creatinine Clearance: 89.8 mL/min (by C-G formula based on SCr of 1.05 mg/dL).   Medications:  Infusions:  . sodium chloride Stopped (07/02/20 1915)  . ceFEPime (MAXIPIME) IV 2 g (07/05/20 0135)    Assessment: 48 yoM admitted on 12/1 from [facility] office due to CT scan + bilateral pulmonary emboli.  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  No prior to admission anticoagulation.  Pharmacy is consulted to dose heparin IV.  Today, 07/05/2020:  HL = 0.42 remains therapeutic on heparin infusion of 1600 units/hr  CBC: Hgb (8.8) low but stable; Plt WNL & stable  Confirmed with RN that heparin infusing at correct rate. No signs of bleeding.  This AM pt is being transitioned to apixaban from heparin per oncology   Goal of Therapy:  Heparin level 0.3-0.7 units/ml Monitor platelets by anticoagulation protocol: Yes   Plan:   Stop heparin drip and start apixaban 10 mg PO BID x 7 days, then apixaban 5 mg PO BID   Scr, CBC as available and appropriate   Monitor for signs of bleeding   Royetta Asal, PharmD, BCPS 07/05/2020 7:41  AM

## 2020-07-05 NOTE — Discharge Instructions (Signed)
Information on my medicine - ELIQUIS (apixaban)  This medication education was reviewed with me or my healthcare representative as part of my discharge preparation.    Why was Eliquis prescribed for you? Eliquis was prescribed to treat blood clots that may have been found in the veins of your legs (deep vein thrombosis) or in your lungs (pulmonary embolism) and to reduce the risk of them occurring again.  What do You need to know about Eliquis ? The starting dose is 10 mg (two 5 mg tablets) taken TWICE daily for the FIRST SEVEN (7) DAYS, then on  07/12/20  the dose is reduced to ONE 5 mg tablet taken TWICE daily.  Eliquis may be taken with or without food.   Try to take the dose about the same time in the morning and in the evening. If you have difficulty swallowing the tablet whole please discuss with your pharmacist how to take the medication safely.  Take Eliquis exactly as prescribed and DO NOT stop taking Eliquis without talking to the doctor who prescribed the medication.  Stopping may increase your risk of developing a new blood clot.  Refill your prescription before you run out.  After discharge, you should have regular check-up appointments with your healthcare provider that is prescribing your Eliquis.    What do you do if you miss a dose? If a dose of ELIQUIS is not taken at the scheduled time, take it as soon as possible on the same day and twice-daily administration should be resumed. The dose should not be doubled to make up for a missed dose.  Important Safety Information A possible side effect of Eliquis is bleeding. You should call your healthcare provider right away if you experience any of the following: ? Bleeding from an injury or your nose that does not stop. ? Unusual colored urine (red or dark brown) or unusual colored stools (red or black). ? Unusual bruising for unknown reasons. ? A serious fall or if you hit your head (even if there is no  bleeding).  Some medicines may interact with Eliquis and might increase your risk of bleeding or clotting while on Eliquis. To help avoid this, consult your healthcare provider or pharmacist prior to using any new prescription or non-prescription medications, including herbals, vitamins, non-steroidal anti-inflammatory drugs (NSAIDs) and supplements.  This website has more information on Eliquis (apixaban): http://www.eliquis.com/eliquis/home

## 2020-07-05 NOTE — Progress Notes (Signed)
Mr. Oelkers is now up on 5 W.  His oxygen level is doing fantastic.  He is on a 1 L of oxygen.  He is ambulating.  So far, no cultures are positive.  Cytology on the bronchial washings is negative.  There is no obvious pneumocystis.  His chest x-ray yesterday I think little bit better.  It is still hard to say what his triggered all of this.  The Neulasta that he was getting for white cell simulation certainly could do it although this is incredibly rare.  I am not sure if he received a bio similar with his last injection.  I will have to talk to our pharmacist about this.  He feels well.  There is no diarrhea.  His cough is much better.  He has had no chest wall pain.  We can switch him out of heparin now and put him on Eliquis.  I think this would be reasonable.  He really does not have a high burden of pulmonary emboli.  There is no thrombus in his legs.  I think Eliquis would be reasonable to try.  He is still on steroids.  I am sure this is why his white cell count is 54,000.  In addition, the Neulasta that he received a couple weeks ago is starting to work.  His hemoglobin is 8.8.  Platelet count 397,000.  BUN is 22 creatinine 1.05.  His liver function studies are normal.  His appetite is good.  There is no nausea or vomiting.  He has had no rashes.  His vital signs are temperature 98.  Pulse 91.  Blood pressure 117/78.  Weight is 183 pounds.  Oxygen saturation is 97%.  His lungs sound good bilaterally.  He has good breath sounds bilaterally.  He has good air movement.  No wheezes are noted.  Cardiac exam regular rate and rhythm.  Abdomen is soft.  Has decent bowel sounds.  Extremities shows no clubbing, cyanosis or edema.  Neurological exam is nonfocal.  Hopefully, the antibiotics will be able to be stopped soon.  His steroids might be able to be decreased.  It would be nice if he could go home over the weekend.  Hopefully, he will be able to ambulate without supplemental oxygen and still  maintain a good level.  I am so thankful for all the great care that he has been getting.  I appreciate all the staff's help.  Lattie Haw, MD  2 thessalonians 2:17

## 2020-07-05 NOTE — Progress Notes (Signed)
SATURATION QUALIFICATIONS: (This note is used to comply with regulatory documentation for home oxygen)  Patient Saturations on Room Air at Rest = 100%  Patient Saturations on Room Air while Ambulating = 96%   

## 2020-07-06 LAB — CULTURE, RESPIRATORY W GRAM STAIN
Culture: NO GROWTH
Gram Stain: NONE SEEN

## 2020-07-07 ENCOUNTER — Encounter: Payer: Self-pay | Admitting: *Deleted

## 2020-07-07 ENCOUNTER — Inpatient Hospital Stay
Admission: AD | Admit: 2020-07-07 | Payer: BC Managed Care – PPO | Source: Ambulatory Visit | Admitting: Hematology & Oncology

## 2020-07-07 LAB — CULTURE, BLOOD (ROUTINE X 2)
Culture: NO GROWTH
Culture: NO GROWTH
Specimen Description: ADEQUATE

## 2020-07-07 LAB — LEGIONELLA, DFA (W/OUT CULTURE): Legionella Pneumophila DFA: NEGATIVE

## 2020-07-07 NOTE — Progress Notes (Signed)
Called to check on patient after his admission last week. He is doing well. He continues to feel run down, but it resting and performing self care. He is on room air and his sats are in the high 90s.  Reviewed update with Dr Marin Olp. We will continue to plan admission next Monday, December 13th, for next chemo cycle.   Patient needed PET scan rescheduled as he was hospitalized and prior appointment cancelled. Appointment made and patient made aware of appointment details and prep.   Oncology Nurse Navigator Documentation  Oncology Nurse Navigator Flowsheets 07/07/2020  Abnormal Finding Date -  Confirmed Diagnosis Date -  Diagnosis Status -  Planned Course of Treatment -  Phase of Treatment -  Chemotherapy Actual Start Date: -  Navigator Follow Up Date: -  Navigator Follow Up Reason: -  Production assistant, radio Encounter Type Telephone  Telephone Outgoing Call;Patient Update  Treatment Initiated Date -  Patient Visit Type MedOnc  Treatment Phase Active Tx  Barriers/Navigation Needs Coordination of Care;Education  Education Other  Interventions Psycho-Social Support  Acuity Level 3-Moderate Needs (3-4 Barriers Identified)  Coordination of Care Appts;Radiology  Education Method Verbal  Support Groups/Services Friends and Family  Time Spent with Patient 66

## 2020-07-08 ENCOUNTER — Encounter (HOSPITAL_COMMUNITY): Payer: Self-pay | Admitting: Internal Medicine

## 2020-07-08 LAB — PNEUMOCYSTIS PCR: Result Pneumocystis PCR: NEGATIVE

## 2020-07-09 ENCOUNTER — Encounter: Payer: Self-pay | Admitting: *Deleted

## 2020-07-09 NOTE — Progress Notes (Signed)
  Notified bed placement, and inpatient chemo team of planned patient admission for cycle 4 of Travis Duran on 07/14/2020. Covid screen scheduled for 07/11/2020.Will educate patient via Travis Duran.       Oncology Nurse Navigator Documentation  Oncology Nurse Navigator Flowsheets 07/09/2020  Abnormal Finding Date -  Confirmed Diagnosis Date -  Diagnosis Status -  Planned Course of Treatment -  Phase of Treatment -  Chemotherapy Actual Start Date: -  Navigator Follow Up Date: 07/14/2020  Navigator Follow Up Reason: Other:  Navigator Location [facility]  Navigator Encounter Type MyChart;Appt/Treatment Plan Review  Telephone -  Treatment Initiated Date -  Patient Visit Type MedOnc  Treatment Phase Active Tx  Barriers/Navigation Needs Coordination of Care;Education  Education Other  Interventions Coordination of Care  Acuity Level 3-Moderate Needs (3-4 Barriers Identified)  Coordination of Care Appts  Education Method Verbal;Written  Support Groups/Services Friends and Family  Time Spent with Patient 33

## 2020-07-09 NOTE — Discharge Summary (Signed)
[facility] Discharge Summary   Patient: Travis Duran HYQ:[id number]  PCP: Laurey Morale, MD  Date of admission: 07/02/2020   Date of discharge: 07/05/2020     Discharge Diagnoses:   Principal Problem:   Acute respiratory failure with hypoxia ([place]) Active Problems:   Diffuse large B cell lymphoma ([place])   Pulmonary embolism ([place])   Admitted From: Home Disposition:  Home   Recommendations for Outpatient Follow-up:  1. PCP: Follow-up with PCP, pulmonology, oncology. 2. Follow up LABS/TEST: None   Follow-up Information    Laurey Morale, MD. Schedule an appointment as soon as possible for a visit in 1 week(s).   Specialty: Family Medicine Contact information: [place] [id number] [phone]              Diet recommendation: Cardiac diet  Activity: The patient is advised to gradually reintroduce usual activities, as tolerated  Discharge Condition: stable  Code Status: Full code   History of present illness: As per the H and P dictated on admission, "Travis Duran is a 44 y.o. male with medical history significant for diffuse large B-cell lymphoma currently on chemotherapy with R-EPOCH, OSA who presented to the ED from the cancer clinic for evaluation of hypoxia.  Patient follows with oncology, Dr. Marin Olp. He is between cycles 3 and 4 of R-EPOCH. He developed shortness of breath which has been worse with exertion and frequent nonproductive cough last week. He had a chest x-ray performed on 06/30/2020 which showed increasing hazy and patchy opacities in the lower lungs. He has been on empiric antibiotics with ciprofloxacin. He has also been on Famvir and Diflucan prophylactically.  He was seen again by his oncologist earlier today 07/02/2020 and was found to be hypoxic and tachycardic. CTA chest PE study was performed and did show pulmonary emboli in the posterior segment lower lobe pulmonary artery branches bilaterally without central  pulmonary embolus or right heart strain. He was noted to have widespread airspace opacities throughout the lungs with consolidation in the lung bases concerning for atypical pneumonia or hypersensitivity/chemotherapy associated pneumonitis.  Patient was sent to [place] ED for further evaluation.  Patient otherwise denies any subjective fevers, chills, diaphoresis, chest pain, abdominal pain, diarrhea, dysuria, lower extremity swelling."  Hospital Course:  Summary of his active problems in the hospital is as following. 1.  Acute hypoxic respiratory failure, POA Diffuse groundglass opacity Pneumonitis versus HCAP No oxygen at baseline. On presentation 74% on room air. Currently on 2 LPM CRP is not elevated. No history of fever. Procalcitonin mildly elevated. MRSA PCR negative.  Initially started on IV vancomycin and cefepime currently on IV cefepime. Follow-up on blood cultures. Follow-up on sputum cultures. Pulmonary consulted, SP bronchoscopy.  Normal airway, diffuse groundglass opacity, diffuse lung disease. Follow-up on results of BAL.  So far negative PJP smear negative. Gram stain negative. Oxygenation improving.  Becomes severely tachycardic when mobilizing. Echocardiogram to evaluate for CHF. BNP elevated although this can also be secondary to PE. Will discharge on oral Levaquin and prednisone taper.  2.  DLBCL Continue famciclovir as well as fluconazole for prophylaxis. Received Neulasta which likely is the cause for leukocytosis. Oncology following.  3.  Acute pulmonary embolism Posterior segment lower lobe pulmonary artery PE. No RV strain. Lower extremity Doppler negative for DVT. Echocardiogram showed RV normal. Currently on IV heparin.  4.  Anemia, likely chemotherapy-induced. Hemoglobin 8.8.  On admission 9.8.  Monitor daily CBC.  No active bleeding.  5.  Exertional sinus tachycardia. On ambulation patient's heart rate goes up to 130. Patient  remains asymptomatic. Oxygen stable, vitals are negative. No hypoxia on ambulation. No further work-up.  Patient was ambulatory without any assistance. On the day of the discharge the patient's vitals were stable, and no other acute medical condition were reported by patient. The patient was felt safe to be discharge at Home with no therapy needed on discharge.  Consultants: Oncology PCCM  Procedures: Bronchoscopy with BAL, echocardiogram  Discharge Exam: General: Appear in no distress, no Rash; Oral Mucosa Clear, moist. no Abnormal Neck Mass Or lumps, Conjunctiva normal  Cardiovascular: S1 and S2 Present, no Murmur Respiratory: good respiratory effort, Bilateral Air entry present and faint Crackles, no wheezes Abdomen: Bowel Sound present, Soft and no tenderness Extremities: no Pedal edema Neurology: alert and oriented to time, place, and person affect appropriate. no new focal deficit  Filed Weights   07/03/20 1400 07/04/20 0500 07/05/20 0500  Weight: 81.6 kg 84.5 kg 83.4 kg   Vitals:   07/05/20 1416 07/05/20 1418  BP: 118/84 115/69  Pulse: 99 97  Resp: 16 16  Temp: 98 F (36.7 C) 98 F (36.7 C)  SpO2: 97% 97%    DISCHARGE MEDICATION: Allergies as of 07/05/2020   No Known Allergies     Medication List    TAKE these medications   antiseptic oral rinse Liqd 15 mLs by Mouth Rinse route every 4 (four) hours.   Apixaban Starter Pack (60m and 558m Commonly known as: ELIQUIS STARTER PACK Take as directed on package: start with two-6m22mablets twice daily for 7 days. On day 8, switch to one-6mg71mblet twice daily.   ciprofloxacin 500 MG tablet Commonly known as: CIPRO Take 1 tablet (500 mg total) by mouth daily with breakfast. Resume after finishing Levaquin course. What changed: additional instructions   famciclovir 500 MG tablet Commonly known as: FAMVIR Take 1 tablet (500 mg total) by mouth daily.   fluconazole 100 MG tablet Commonly known as: DIFLUCAN Take 1  tablet (100 mg total) by mouth daily.   HYDROcodone-homatropine 5-1.5 MG/5ML syrup Commonly known as: Hycodan Take 5 mLs by mouth every 6 (six) hours as needed for cough.   levofloxacin 750 MG tablet Commonly known as: Levaquin Take 1 tablet (750 mg total) by mouth daily for 5 days.   Melatonin 3 MG Caps Take 3 mg by mouth at bedtime.   ondansetron 4 MG tablet Commonly known as: ZOFRAN Take 1-2 tablets (4-8 mg total) by mouth every 8 (eight) hours as needed for nausea (not responsive to prochlorperazine (COMPAZINE)).   predniSONE 5 MG tablet Commonly known as: DELTASONE Take 10mg46mly for 5 days,Take 5 mg daily for 5 days, then stop   prochlorperazine 10 MG tablet Commonly known as: COMPAZINE Take 1 tablet (10 mg total) by mouth every 6 (six) hours as needed for nausea or vomiting.   sodium bicarbonate/sodium chloride Soln 1 application by Mouth Rinse route every 4 (four) hours.   tadalafil 10 MG tablet Commonly known as: Cialis Take 1 tablet (10 mg total) by mouth daily as needed for erectile dysfunction.      No Known Allergies Discharge Instructions    Diet - low sodium heart healthy   Complete by: As directed    Increase activity slowly   Complete by: As directed    No wound care   Complete by: As directed       The results of significant diagnostics from this hospitalization (including imaging,  microbiology, ancillary and laboratory) are listed below for reference.    Significant Diagnostic Studies: DG Chest 2 View  Result Date: 06/30/2020 CLINICAL DATA:  Worsening cough, shortness of breath EXAM: CHEST - 2 VIEW COMPARISON:  Radiograph 06/16/2020, PET-CT 05/15/2020 FINDINGS: Lung volumes are diminished. There is some increasing hazy and patchy opacities in the lower lungs most focally in the right infrahilar lung and left basilar periphery. No visible pneumothorax or effusion. Redemonstration of a left paramediastinal soft tissue opacity compatible with and  anterior mediastinal mass as seen on recent PET-CT and cross-sectional imaging. The remaining cardiomediastinal contours are unremarkable. Right IJ approach tunneled Port-A-Cath tip terminates at the superior cavoatrial junction. No acute osseous or soft tissue abnormality. IMPRESSION: Increasing hazy and patchy opacities in the lower lungs most focally in the right infrahilar lung and left basilar periphery. Findings may reflect atelectasis versus developing infection/inflammation. Redemonstrated anterior mediastinal mass, better visualized on cross-sectional imaging. Electronically Signed   By: Lovena Le M.D.   On: 06/30/2020 23:51   X-ray chest PA and lateral  Result Date: 06/16/2020 CLINICAL DATA:  History of lymphoma EXAM: CHEST - 2 VIEW COMPARISON:  May 26, 2020 chest radiograph; PET-CT May 15, 2020 FINDINGS: Port-A-Cath tip is at the cavoatrial junction. No pneumothorax. There is no edema or airspace opacity. Heart size is normal. Soft tissue fullness in the anterior mediastinum is seen on the lateral view but not appreciable on the frontal view. No adenopathy elsewhere seen. No bone lesions. IMPRESSION: Soft tissue fullness anterior mediastinum is appreciable on the lateral view and much better delineated as a mass in the right mediastinum anteriorly on recent PET study. Note that mediastinal mass is not appreciable on frontal view. No other apparent adenopathy by radiography. Lungs clear. Heart size normal. Port-A-Cath tip at cavoatrial junction. Electronically Signed   By: Lowella Grip III M.D.   On: 06/16/2020 10:23   CT ANGIO CHEST PE W OR WO CONTRAST  Addendum Date: 07/02/2020   ADDENDUM REPORT: 07/02/2020 15:05 ADDENDUM: Add to addend IMPRESSION: Prominence of the main pulmonary outflow tract likely indicates underlying pulmonary arterial hypertension. Electronically Signed   By: Lowella Grip III M.D.   On: 07/02/2020 15:05   Result Date: 07/02/2020 CLINICAL DATA:   Shortness of breath and tachycardia. Large B-cell lymphoma EXAM: CT ANGIOGRAPHY CHEST WITH CONTRAST TECHNIQUE: Multidetector CT imaging of the chest was performed using the standard protocol during bolus administration of intravenous contrast. Multiplanar CT image reconstructions and MIPs were obtained to evaluate the vascular anatomy. CONTRAST:  33m OMNIPAQUE IOHEXOL 350 MG/ML SOLN COMPARISON:  Chest CT April 24, 2020; chest radiograph June 30, 2020. PET-CT May 15, 2020 FINDINGS: Cardiovascular: There are pulmonary emboli in several lower lobe pulmonary artery branches, primarily in posterior segment regions. No more central pulmonary embolus evident. There is no associated right heart strain evident. There is no thoracic aortic aneurysm or dissection. Main pulmonary outflow tract measures 3.2 cm in diameter, prominent. Visualized great vessels appear unremarkable. There is no pericardial effusion or pericardial thickening. There is mild aortic atherosclerosis. Port-A-Cath tip in superior vena cava. Mediastinum/Nodes: Nodular lesion in the left lobe of the thyroid measures 1.7 x 1.2 cm. Subcentimeter nodule also noted in the right lobe. There remains apparent anterior mediastinal confluent adenopathy, currently measuring 7.1 x 3.2 cm, smaller than on recent studies. There is a lymph node in the right paratracheal region with central fatty hilum measuring 1.7 x 1.0 cm. There is a subcarinal lymph node measuring 1.4 x 1.3  cm, stable. There is a left hilar lymph node measuring 0.9 x 0.9 cm, stable. No new adenopathy evident. There is a fairly small hiatal hernia. Lungs/Pleura: There is ill-defined airspace opacity throughout the lungs bilaterally with mild consolidation in the left base and to a lesser extent in the inferior lingula. No pleural effusions are evident. Visualized upper abdominal structures appear unremarkable. Musculoskeletal: No blastic or lytic bone lesions. Port present on the right  anteriorly. 1 Review of the MIP images confirms the above findings. IMPRESSION: 1. Pulmonary emboli noted in posterior segment lower lobe pulmonary artery branches bilaterally. No more central pulmonary embolus. No right heart strain. 2. Widespread ill-defined airspace opacity throughout the lungs with a degree of consolidation in the lung bases. Appearance consistent with multifocal pneumonia. Atypical organism pneumonia could present in this manner. Advise correlation with COVID-19 status. It should be noted that hypersensitivity pneumonitis/chemotherapy response could present in this manner as well. No pleural effusions. 3. Large confluence of adenopathy in the anterior mediastinum, smaller compared to most recent study. Smaller lymph nodes elsewhere as noted. 4. Mass in left lobe of thyroid measuring 1.7 x 1.2 cm. Recommend thyroid US per consensus guidelines (ref: J Am Coll Radiol. 2015 Feb;12(2): 143-50). Critical Value/emergent results were called by telephone at the time of interpretation on 07/02/2020 at 1:46 pm to provider Burney Gauze, who verbally acknowledged these results. Electronically Signed: By: Lowella Grip III M.D. On: 07/02/2020 13:48   DG Chest Port 1 View  Result Date: 07/04/2020 CLINICAL DATA:  Pneumonia, follow-up, history lymphoma, pulmonary embolism EXAM: PORTABLE CHEST 1 VIEW COMPARISON:  Portable exam 0828 hours compared to 06/30/2020 FINDINGS: RIGHT jugular Port-A-Cath with tip projecting over cavoatrial junction. Normal heart size, mediastinal contours, and pulmonary vascularity. Mild bibasilar atelectasis. Remaining lungs clear. No acute infiltrate, pleural effusion or pneumothorax. IMPRESSION: Mild bibasilar atelectasis. Electronically Signed   By: Lavonia Dana M.D.   On: 07/04/2020 10:08   ECHOCARDIOGRAM COMPLETE  Result Date: 07/03/2020    ECHOCARDIOGRAM REPORT   Patient Name:   Travis Duran Date of Exam: 07/03/2020 Medical Rec #:  [id number]    Height:       69.0 in  Accession #:    [id number]   Weight:       192.0 lb Date of Birth:  26-Jan-1976     BSA:          2.030 m Patient Age:    44 years     BP:           110/64 mmHg Patient Gender: M            HR:           94 bpm. Exam Location:  Inpatient Procedure: 2D Echo, Cardiac Doppler, Color Doppler and Strain Analysis Indications:    I26.02 Pulmonary embolus  History:        Patient has prior history of Echocardiogram examinations, most                 recent 04/27/2020. Signs/Symptoms:Dyspnea and Shortness of                 Breath; Risk Factors:Sleep Apnea. Lymphoma. Chemotherapy. Port.                 Hypoxia.  Sonographer:    Roseanna Rainbow RDCS Referring Phys: [id number] [place]  Sonographer Comments: Technically difficult study due to poor echo windows. Global longitudinal strain was attempted. Poor sound wave transmission. IMPRESSIONS  1. Left  ventricular ejection fraction, by estimation, is 60 to 65%. The left ventricle has normal function. The left ventricle has no regional wall motion abnormalities. There is mild left ventricular hypertrophy. Left ventricular diastolic parameters were normal.  2. Right ventricular systolic function is normal. The right ventricular size is normal. Tricuspid regurgitation signal is inadequate for assessing PA pressure.  3. The mitral valve is normal in structure. Trivial mitral valve regurgitation. No evidence of mitral stenosis.  4. The aortic valve is tricuspid. Aortic valve regurgitation is not visualized. No aortic stenosis is present.  5. Aortic dilatation noted. There is mild dilatation of the ascending aorta, measuring 38 mm.  6. The inferior vena cava is normal in size with greater than 50% respiratory variability, suggesting right atrial pressure of 3 mmHg. FINDINGS  Left Ventricle: Left ventricular ejection fraction, by estimation, is 60 to 65%. The left ventricle has normal function. The left ventricle has no regional wall motion abnormalities. The left ventricular internal  cavity size was normal in size. There is  mild left ventricular hypertrophy. Left ventricular diastolic parameters were normal. Right Ventricle: The right ventricular size is normal. No increase in right ventricular wall thickness. Right ventricular systolic function is normal. Tricuspid regurgitation signal is inadequate for assessing PA pressure. Left Atrium: Left atrial size was normal in size. Right Atrium: Right atrial size was normal in size. Pericardium: There is no evidence of pericardial effusion. Mitral Valve: The mitral valve is normal in structure. Trivial mitral valve regurgitation. No evidence of mitral valve stenosis. Tricuspid Valve: The tricuspid valve is normal in structure. Tricuspid valve regurgitation is trivial. No evidence of tricuspid stenosis. Aortic Valve: The aortic valve is tricuspid. Aortic valve regurgitation is not visualized. No aortic stenosis is present. Pulmonic Valve: The pulmonic valve was not well visualized. Pulmonic valve regurgitation is not visualized. No evidence of pulmonic stenosis. Aorta: Aortic dilatation noted. There is mild dilatation of the ascending aorta, measuring 38 mm. Venous: The inferior vena cava is normal in size with greater than 50% respiratory variability, suggesting right atrial pressure of 3 mmHg. IAS/Shunts: No atrial level shunt detected by color flow Doppler.  LEFT VENTRICLE PLAX 2D LVIDd:         4.19 cm      Diastology LVIDs:         2.67 cm      LV e' medial:    8.92 cm/s LV PW:         1.31 cm      LV E/e' medial:  7.9 LV IVS:        1.49 cm      LV e' lateral:   15.23 cm/s LVOT diam:     2.20 cm      LV E/e' lateral: 4.6 LV SV:         67 LV SV Index:   33 LVOT Area:     3.80 cm  LV Volumes (MOD) LV vol d, MOD A2C: 83.7 ml LV vol d, MOD A4C: 104.0 ml LV vol s, MOD A2C: 34.6 ml LV vol s, MOD A4C: 46.1 ml LV SV MOD A2C:     49.1 ml LV SV MOD A4C:     104.0 ml LV SV MOD BP:      55.6 ml RIGHT VENTRICLE            IVC RV S prime:     9.57 cm/s  IVC  diam: 1.62 cm TAPSE (M-mode): 1.6 cm LEFT ATRIUM  Index       RIGHT ATRIUM          Index LA diam:        3.30 cm 1.63 cm/m  RA Area:     9.31 cm LA Vol (A2C):   34.6 ml 17.04 ml/m RA Volume:   15.40 ml 7.58 ml/m LA Vol (A4C):   27.3 ml 13.45 ml/m LA Biplane Vol: 33.0 ml 16.25 ml/m  AORTIC VALVE LVOT Vmax:   95.00 cm/s LVOT Vmean:  65.900 cm/s LVOT VTI:    0.175 m  AORTA Ao Root diam: 3.20 cm Ao Asc diam:  3.80 cm MITRAL VALVE MV Area (PHT): 4.15 cm    SHUNTS MV Decel Time: 183 msec    Systemic VTI:  0.18 m MV E velocity: 70.60 cm/s  Systemic Diam: 2.20 cm MV A velocity: 90.80 cm/s MV E/A ratio:  0.78 Kirk Ruths MD Electronically signed by Kirk Ruths MD Signature Date/Time: 07/03/2020/2:17:08 PM    Final    VAS [place] LOWER EXTREMITY VENOUS (DVT)  Result Date: 07/04/2020  Lower Venous DVT Study Indications: Pulmonary embolism.  Risk Factors: Cancer. Comparison Study: No prior studies. Performing Technologist: Oliver Hum RVT  Examination Guidelines: A complete evaluation includes B-mode imaging, spectral Doppler, color Doppler, and power Doppler as needed of all accessible portions of each vessel. Bilateral testing is considered an integral part of a complete examination. Limited examinations for reoccurring indications may be performed as noted. The reflux portion of the exam is performed with the patient in reverse Trendelenburg.  +---------+---------------+---------+-----------+----------+--------------+ RIGHT    CompressibilityPhasicitySpontaneityPropertiesThrombus Aging +---------+---------------+---------+-----------+----------+--------------+ CFV      Full           Yes      Yes                                 +---------+---------------+---------+-----------+----------+--------------+ SFJ      Full                                                        +---------+---------------+---------+-----------+----------+--------------+ FV Prox  Full                                                         +---------+---------------+---------+-----------+----------+--------------+ FV Mid   Full                                                        +---------+---------------+---------+-----------+----------+--------------+ FV DistalFull                                                        +---------+---------------+---------+-----------+----------+--------------+ PFV      Full                                                        +---------+---------------+---------+-----------+----------+--------------+  POP      Full           Yes      Yes                                 +---------+---------------+---------+-----------+----------+--------------+ PTV      Full                                                        +---------+---------------+---------+-----------+----------+--------------+ PERO     Full                                                        +---------+---------------+---------+-----------+----------+--------------+   +---------+---------------+---------+-----------+----------+--------------+ LEFT     CompressibilityPhasicitySpontaneityPropertiesThrombus Aging +---------+---------------+---------+-----------+----------+--------------+ CFV      Full           Yes      Yes                                 +---------+---------------+---------+-----------+----------+--------------+ SFJ      Full                                                        +---------+---------------+---------+-----------+----------+--------------+ FV Prox  Full                                                        +---------+---------------+---------+-----------+----------+--------------+ FV Mid   Full                                                        +---------+---------------+---------+-----------+----------+--------------+ FV DistalFull                                                         +---------+---------------+---------+-----------+----------+--------------+ PFV      Full                                                        +---------+---------------+---------+-----------+----------+--------------+ POP      Full           Yes      Yes                                 +---------+---------------+---------+-----------+----------+--------------+  PTV      Full                                                        +---------+---------------+---------+-----------+----------+--------------+ PERO     Full                                                        +---------+---------------+---------+-----------+----------+--------------+     Summary: RIGHT: - There is no evidence of deep vein thrombosis in the lower extremity.  - No cystic structure found in the popliteal fossa.  LEFT: - There is no evidence of deep vein thrombosis in the lower extremity.  - No cystic structure found in the popliteal fossa.  *See table(s) above for measurements and observations. Electronically signed by Jamelle Haring on 07/04/2020 at 1:47:54 PM.    Final     Microbiology: Recent Results (from the past 240 hour(s))  Resp Panel by RT-PCR (Flu A&B, Covid) Nasopharyngeal Swab     Status: None   Collection Time: 07/02/20  2:54 PM   Specimen: Nasopharyngeal Swab; Nasopharyngeal(NP) swabs in vial transport medium  Result Value Ref Range Status   SARS Coronavirus 2 by RT PCR NEGATIVE NEGATIVE Final    Comment: (NOTE) SARS-CoV-2 target nucleic acids are NOT DETECTED.  The SARS-CoV-2 RNA is generally detectable in upper respiratory specimens during the acute phase of infection. The lowest concentration of SARS-CoV-2 viral copies this assay can detect is 138 copies/mL. A negative result does not preclude SARS-Cov-2 infection and should not be used as the sole basis for treatment or other patient management decisions. A negative result may occur with  improper specimen collection/handling,  submission of specimen other than nasopharyngeal swab, presence of viral mutation(s) within the areas targeted by this assay, and inadequate number of viral copies(<138 copies/mL). A negative result must be combined with clinical observations, patient history, and epidemiological information. The expected result is Negative.  Fact Sheet for Patients:  EntrepreneurPulse.com.au  Fact Sheet for Healthcare Providers:  IncredibleEmployment.be  This test is no t yet approved or cleared by the [place] FDA and  has been authorized for detection and/or diagnosis of SARS-CoV-2 by FDA under an Emergency Use Authorization (EUA). This EUA will remain  in effect (meaning this test can be used) for the duration of the COVID-19 declaration under Section 564(b)(1) of the Act, 21 U.S.C.section 360bbb-3(b)(1), unless the authorization is terminated  or revoked sooner.       Influenza A by PCR NEGATIVE NEGATIVE Final   Influenza B by PCR NEGATIVE NEGATIVE Final    Comment: (NOTE) The Xpert Xpress SARS-CoV-2/FLU/RSV plus assay is intended as an aid in the diagnosis of influenza from Nasopharyngeal swab specimens and should not be used as a sole basis for treatment. Nasal washings and aspirates are unacceptable for Xpert Xpress SARS-CoV-2/FLU/RSV testing.  Fact Sheet for Patients: EntrepreneurPulse.com.au  Fact Sheet for Healthcare Providers: IncredibleEmployment.be  This test is not yet approved or cleared by the [place] FDA and has been authorized for detection and/or diagnosis of SARS-CoV-2 by FDA under an Emergency Use Authorization (EUA). This EUA will remain in effect (meaning this test can be used)  for the duration of the COVID-19 declaration under Section 564(b)(1) of the Act, 21 U.S.C. section 360bbb-3(b)(1), unless the authorization is terminated or revoked.  Performed at [facility], [address] [place], [place] [address]   Blood Culture (routine x 2)     Status: None   Collection Time: 07/02/20  3:08 PM   Specimen: BLOOD  Result Value Ref Range Status   Specimen Description   Final    BLOOD Blood Culture results may not be optimal due to an excessive volume of blood received in culture bottles Performed at [facility], [address] [place], [place] [address]    Special Requests   Final    BOTTLES DRAWN AEROBIC AND ANAEROBIC LEFT ANTECUBITAL Performed at [facility], [address] [place], [place] [address]    Culture   Final    NO GROWTH 5 DAYS Performed at [facility], [place] [address], [place], [place] [address]    Report Status 07/07/2020 FINAL  Final  Blood Culture (routine x 2)     Status: None   Collection Time: 07/02/20  3:27 PM   Specimen: BLOOD  Result Value Ref Range Status   Specimen Description   Final    BLOOD Blood Culture adequate volume Performed at [facility], [address] [place], [place] [address]    Special Requests   Final    BOTTLES DRAWN AEROBIC AND ANAEROBIC RIGHT ANTECUBITAL Performed at [facility], [address] [place], [place] [address]    Culture   Final    NO GROWTH 5 DAYS Performed at [facility], [place] [address] [place], [place] [address]    Report Status 07/07/2020 FINAL  Final  MRSA PCR Screening     Status: None   Collection Time: 07/02/20 10:00 PM   Specimen: Nasopharyngeal Wash  Result Value Ref Range Status   MRSA by PCR NEGATIVE NEGATIVE Final    Comment:        The GeneXpert MRSA Assay (FDA approved for NASAL specimens only), is one component of a comprehensive MRSA colonization surveillance program. It is not intended to diagnose MRSA infection nor to guide or monitor treatment for MRSA infections. Performed at [facility], [place] [address], [place], [place] [address]   Resp Panel by RT-PCR (Flu A&B, Covid)  Nasopharyngeal Swab     Status: None   Collection Time: 07/03/20  5:00 AM   Specimen: Nasopharyngeal Swab; Nasopharyngeal(NP) swabs in vial transport medium  Result Value Ref Range Status   SARS Coronavirus 2 by RT PCR NEGATIVE NEGATIVE Final    Comment: (NOTE) SARS-CoV-2 target nucleic acids are NOT DETECTED.  The SARS-CoV-2 RNA is generally detectable in upper respiratory specimens during the acute phase of infection. The lowest concentration of SARS-CoV-2 viral copies this assay can detect is 138 copies/mL. A negative result does not preclude SARS-Cov-2 infection and should not be used as the sole basis for treatment or other patient management decisions. A negative result may occur with  improper specimen collection/handling, submission of specimen other than nasopharyngeal swab, presence of viral mutation(s) within the areas targeted by this assay, and inadequate number of viral copies(<138 copies/mL). A negative result must be combined with clinical observations, patient history, and epidemiological information. The expected result is Negative.  Fact Sheet for Patients:  EntrepreneurPulse.com.au  Fact Sheet for Healthcare Providers:  IncredibleEmployment.be  This test is  no t yet approved or cleared by the [place] and  has been authorized for detection and/or diagnosis of SARS-CoV-2 by FDA under an Emergency Use Authorization (EUA). This EUA will remain  in effect (meaning this test can be used) for the duration of the COVID-19 declaration under Section 564(b)(1) of the Act, 21 U.S.C.section 360bbb-3(b)(1), unless the authorization is terminated  or revoked sooner.       Influenza A by PCR NEGATIVE NEGATIVE Final   Influenza B by PCR NEGATIVE NEGATIVE Final    Comment: (NOTE) The Xpert Xpress SARS-CoV-2/FLU/RSV plus assay is intended as an aid in the diagnosis of influenza from Nasopharyngeal swab specimens and should not be  used as a sole basis for treatment. Nasal washings and aspirates are unacceptable for Xpert Xpress SARS-CoV-2/FLU/RSV testing.  Fact Sheet for Patients: EntrepreneurPulse.com.au  Fact Sheet for Healthcare Providers: IncredibleEmployment.be  This test is not yet approved or cleared by the [place] FDA and has been authorized for detection and/or diagnosis of SARS-CoV-2 by FDA under an Emergency Use Authorization (EUA). This EUA will remain in effect (meaning this test can be used) for the duration of the COVID-19 declaration under Section 564(b)(1) of the Act, 21 U.S.C. section 360bbb-3(b)(1), unless the authorization is terminated or revoked.  Performed at [facility], [place] [address], [place], [place] [address]   Legionella, DFA (w/out culture)     Status: None   Collection Time: 07/03/20  2:23 PM   Specimen: Blood  Result Value Ref Range Status   Source, Legionella Cul BRONCHIAL ALVEOLAR LAVAGE  Corrected    Comment: RML Performed at [facility] [address] [place], [place] [address] CORRECTED ON 12/02 AT 1506: PREVIOUSLY REPORTED AS BLOOD    Legionella Pneumophila DFA Negative Negative Final    Comment: (NOTE) Performed At: [facility] [place], [place] [id number] Rush Farmer MD ZM:[id number]   Pneumocystis smear by DFA     Status: None   Collection Time: 07/03/20  2:23 PM   Specimen: Bronchial Alveolar Lavage; Respiratory  Result Value Ref Range Status   Specimen Source-PJSRC BRONCHIAL ALVEOLAR LAVAGE  Final    Comment: RML   Pneumocystis jiroveci Ag NEGATIVE  Final    Comment: Performed at [place] Performed at [facility] [address], [place], [place] [address]   Fungus Culture With Stain     Status: None (Preliminary result)   Collection Time: 07/03/20  2:23 PM   Specimen: Bronchial Alveolar Lavage; Respiratory   Result Value Ref Range Status   Fungus Stain Final report  Final    Comment: (NOTE) Performed At: [facility] [place], [place] [id number] Rush Farmer MD GY:[id number]    Fungus (Mycology) Culture PENDING  Incomplete   Fungal Source BRONCHIAL ALVEOLAR LAVAGE  Final    Comment: RML Performed at [facility] [address] [place], [place] [address]   Culture, respiratory     Status: None   Collection Time: 07/03/20  2:23 PM   Specimen: Bronchial Alveolar Lavage; Respiratory  Result Value Ref Range Status   Specimen Description   Final    BRONCHIAL ALVEOLAR LAVAGE RML Performed at [facility] [address], [place], [place] [address]    Special Requests   Final    NONE Performed at [facility], [place] [address] [place], [place] [address]    Gram Stain NO WBC SEEN  NO ORGANISMS SEEN   Final   Culture   Final    NO GROWTH 2 DAYS Performed at [facility], [place] [address], [place], [place] [address]    Report Status 07/06/2020 FINAL  Final  Acid Fast Smear (AFB)     Status: None   Collection Time: 07/03/20  2:23 PM   Specimen: Bronchial Alveolar Lavage; Respiratory  Result Value Ref Range Status   AFB Specimen Processing Concentration  Final   Acid Fast Smear Negative  Final    Comment: (NOTE) Performed At: [facility] [place], [place] [id number] Rush Farmer MD FH:[id number]    Source (AFB) BRONCHIAL ALVEOLAR LAVAGE  Final    Comment: RML Performed at [facility] [address] [place], [place] [address]   Fungus Culture Result     Status: None   Collection Time: 07/03/20  2:23 PM  Result Value Ref Range Status   Result 1 Comment  Final    Comment: (NOTE) KOH/Calcofluor preparation:  no fungus observed. Performed At: [facility] [place], [place] [id number] Rush Farmer MD IO:[id number]       Labs: CBC: Recent Labs  Lab 07/02/20 1135 07/03/20 0515 07/04/20 0458 07/05/20 0445  WBC 33.6* 30.0* 36.4* 54.0*  NEUTROABS 22.1*  --  30.0* 45.2*  HGB 9.8* 8.8* 9.2* 8.8*  HCT 30.8* 28.3* 28.9* 27.4*  MCV 87.3 88.2 87.8 88.7  PLT 386 327 372 845   Basic Metabolic Panel: Recent Labs  Lab 07/02/20 1135 07/03/20 0515 07/04/20 0458 07/05/20 0445  NA 136 138 136 136  K 3.8 3.6 4.0 4.2  CL 103 108 105 105  CO2 25 22 21* 20*  GLUCOSE 111* 92 188* 163*  BUN _0 22*  CREATININE 1.09 0.82 1.02 1.05  CALCIUM 9.4 7.9* 8.7* 8.7*   Liver Function Tests: Recent Labs  Lab 07/02/20 1135 07/04/20 0458 07/05/20 0445  AST _1 ALT 29 29 34  ALKPHOS 129* 99 105  BILITOT 0.3 0.5 0.6  PROT 6.3* 5.8* 5.5*  ALBUMIN 3.7 3.1* 3.0*   CBG: No results for input(s): GLUCAP in the last 168 hours.  Time spent: 35 minutes  Signed:  Berle Mull  [facility] 07/05/2020

## 2020-07-11 ENCOUNTER — Other Ambulatory Visit: Payer: Self-pay

## 2020-07-11 ENCOUNTER — Encounter: Payer: Self-pay | Admitting: *Deleted

## 2020-07-11 ENCOUNTER — Other Ambulatory Visit
Admission: RE | Admit: 2020-07-11 | Discharge: 2020-07-11 | Disposition: A | Discharge status: Home / Self Care | Payer: BC Managed Care – PPO | Source: Ambulatory Visit | Attending: Hematology & Oncology | Admitting: Hematology & Oncology

## 2020-07-11 DIAGNOSIS — Z01812 Encounter for preprocedural laboratory examination: Secondary | ICD-10-CM | POA: Insufficient documentation

## 2020-07-11 DIAGNOSIS — Z20822 Contact with and (suspected) exposure to covid-19: Secondary | ICD-10-CM | POA: Diagnosis not present

## 2020-07-11 NOTE — Progress Notes (Signed)
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

## 2020-07-12 ENCOUNTER — Other Ambulatory Visit: Payer: Self-pay | Admitting: Hematology & Oncology

## 2020-07-12 DIAGNOSIS — C8582 Other specified types of non-Hodgkin lymphoma, intrathoracic lymph nodes: Secondary | ICD-10-CM

## 2020-07-12 LAB — SARS CORONAVIRUS 2 (TAT 6-24 HRS): SARS Coronavirus 2: NEGATIVE

## 2020-07-14 ENCOUNTER — Institutional Professional Consult (permissible substitution): Payer: BC Managed Care – PPO | Admitting: Pulmonary Disease

## 2020-07-14 ENCOUNTER — Inpatient Hospital Stay (HOSPITAL_COMMUNITY): Payer: BC Managed Care – PPO

## 2020-07-14 ENCOUNTER — Encounter (HOSPITAL_COMMUNITY): Payer: Self-pay | Admitting: Hematology & Oncology

## 2020-07-14 ENCOUNTER — Inpatient Hospital Stay (HOSPITAL_COMMUNITY)
Admission: AD | Admit: 2020-07-14 | Discharge: 2020-07-18 | DRG: 847 | Disposition: A | Discharge status: Home / Self Care | Payer: BC Managed Care – PPO | Source: Ambulatory Visit | Attending: Hematology & Oncology | Admitting: Hematology & Oncology

## 2020-07-14 ENCOUNTER — Other Ambulatory Visit: Payer: Self-pay

## 2020-07-14 DIAGNOSIS — C8529 Mediastinal (thymic) large B-cell lymphoma, extranodal and solid organ sites: Secondary | ICD-10-CM

## 2020-07-14 DIAGNOSIS — I2699 Other pulmonary embolism without acute cor pulmonale: Secondary | ICD-10-CM | POA: Diagnosis not present

## 2020-07-14 DIAGNOSIS — C8332 Diffuse large B-cell lymphoma, intrathoracic lymph nodes: Secondary | ICD-10-CM

## 2020-07-14 DIAGNOSIS — M1711 Unilateral primary osteoarthritis, right knee: Secondary | ICD-10-CM | POA: Diagnosis present

## 2020-07-14 DIAGNOSIS — J90 Pleural effusion, not elsewhere classified: Secondary | ICD-10-CM | POA: Diagnosis present

## 2020-07-14 DIAGNOSIS — Z8249 Family history of ischemic heart disease and other diseases of the circulatory system: Secondary | ICD-10-CM | POA: Diagnosis not present

## 2020-07-14 DIAGNOSIS — C8330 Diffuse large B-cell lymphoma, unspecified site: Secondary | ICD-10-CM | POA: Diagnosis present

## 2020-07-14 DIAGNOSIS — Z825 Family history of asthma and other chronic lower respiratory diseases: Secondary | ICD-10-CM | POA: Diagnosis not present

## 2020-07-14 DIAGNOSIS — E876 Hypokalemia: Secondary | ICD-10-CM | POA: Diagnosis not present

## 2020-07-14 DIAGNOSIS — Z7901 Long term (current) use of anticoagulants: Secondary | ICD-10-CM

## 2020-07-14 DIAGNOSIS — G4733 Obstructive sleep apnea (adult) (pediatric): Secondary | ICD-10-CM | POA: Diagnosis present

## 2020-07-14 DIAGNOSIS — J910 Malignant pleural effusion: Secondary | ICD-10-CM

## 2020-07-14 DIAGNOSIS — C8582 Other specified types of non-Hodgkin lymphoma, intrathoracic lymph nodes: Secondary | ICD-10-CM

## 2020-07-14 DIAGNOSIS — C8520 Mediastinal (thymic) large B-cell lymphoma, unspecified site: Secondary | ICD-10-CM | POA: Diagnosis present

## 2020-07-14 DIAGNOSIS — Z5111 Encounter for antineoplastic chemotherapy: Principal | ICD-10-CM

## 2020-07-14 LAB — COMPREHENSIVE METABOLIC PANEL
ALT: 71 U/L — ABNORMAL HIGH (ref 0–44)
AST: 42 U/L — ABNORMAL HIGH (ref 15–41)
Albumin: 3.4 g/dL — ABNORMAL LOW (ref 3.5–5.0)
Alkaline Phosphatase: 72 U/L (ref 38–126)
Anion gap: 9 (ref 5–15)
BUN: 19 mg/dL (ref 6–20)
CO2: 24 mmol/L (ref 22–32)
Calcium: 8.7 mg/dL — ABNORMAL LOW (ref 8.9–10.3)
Chloride: 104 mmol/L (ref 98–111)
Creatinine, Ser: 0.85 mg/dL (ref 0.61–1.24)
GFR, Estimated: >60 mL/min (ref >60)
Glucose, Bld: 145 mg/dL — ABNORMAL HIGH (ref 70–99)
Potassium: 3.8 mmol/L (ref 3.5–5.1)
Sodium: 137 mmol/L (ref 135–145)
Total Bilirubin: 0.2 mg/dL — ABNORMAL LOW (ref 0.3–1.2)
Total Protein: 5.6 g/dL — ABNORMAL LOW (ref 6.5–8.1)

## 2020-07-14 LAB — CBC WITH DIFFERENTIAL/PLATELET
Abs Immature Granulocytes: 0.43 10*3/uL — ABNORMAL HIGH (ref 0.00–0.07)
Basophils Absolute: 0.1 10*3/uL (ref 0.0–0.1)
Basophils Relative: 1 %
Eosinophils Absolute: 0 10*3/uL (ref 0.0–0.5)
Eosinophils Relative: 0 %
HCT: 33 % — ABNORMAL LOW (ref 39.0–52.0)
Hemoglobin: 10.5 g/dL — ABNORMAL LOW (ref 13.0–17.0)
Immature Granulocytes: 4 %
Lymphocytes Relative: 7 %
Lymphs Abs: 0.8 10*3/uL (ref 0.7–4.0)
MCH: 29.2 pg (ref 26.0–34.0)
MCHC: 31.8 g/dL (ref 30.0–36.0)
MCV: 91.9 fL (ref 80.0–100.0)
Monocytes Absolute: 0.9 10*3/uL (ref 0.1–1.0)
Monocytes Relative: 8 %
Neutro Abs: 8.6 10*3/uL — ABNORMAL HIGH (ref 1.7–7.7)
Neutrophils Relative %: 80 %
Platelets: 346 10*3/uL (ref 150–400)
RBC: 3.59 MIL/uL — ABNORMAL LOW (ref 4.22–5.81)
RDW: 22.1 % — ABNORMAL HIGH (ref 11.5–15.5)
WBC: 10.8 10*3/uL — ABNORMAL HIGH (ref 4.0–10.5)
nRBC: 0 % (ref 0.0–0.2)

## 2020-07-14 LAB — MAGNESIUM: Magnesium: 1.9 mg/dL (ref 1.7–2.4)

## 2020-07-14 LAB — PHOSPHORUS: Phosphorus: 3.1 mg/dL (ref 2.5–4.6)

## 2020-07-14 MED ORDER — MELATONIN 3 MG PO TABS
3.0000 mg | ORAL_TABLET | Freq: Every day | ORAL | Status: DC
  Administered 2020-07-14: 22:00:00 3 mg via ORAL
  Filled 2020-07-14 (×3): qty 1

## 2020-07-14 MED ORDER — VINCRISTINE SULFATE CHEMO INJECTION 1 MG/ML
Freq: Once | INTRAVENOUS | Status: AC
  Filled 2020-07-14: qty 11

## 2020-07-14 MED ORDER — PROCHLORPERAZINE MALEATE 10 MG PO TABS: 10.0000 mg | ORAL_TABLET | Freq: Four times a day (QID) | ORAL | Status: DC | PRN

## 2020-07-14 MED ORDER — FLUCONAZOLE 100 MG PO TABS
100.0000 mg | ORAL_TABLET | Freq: Every day | ORAL | Status: DC
  Administered 2020-07-14 – 2020-07-17 (×4): 100 mg via ORAL
  Filled 2020-07-14 (×5): qty 1

## 2020-07-14 MED ORDER — ONDANSETRON HCL 4 MG PO TABS: 4.0000 mg | ORAL_TABLET | Freq: Three times a day (TID) | ORAL | Status: DC | PRN

## 2020-07-14 MED ORDER — APIXABAN (ELIQUIS) VTE STARTER PACK (10MG AND 5MG): 5.0000 mg | ORAL_TABLET | Freq: Two times a day (BID) | ORAL | Status: DC

## 2020-07-14 MED ORDER — PREDNISONE 20 MG PO TABS
60.0000 mg | ORAL_TABLET | Freq: Every day | ORAL | Status: DC
  Administered 2020-07-15 – 2020-07-18 (×4): 60 mg via ORAL
  Filled 2020-07-14 (×6): qty 3

## 2020-07-14 MED ORDER — POTASSIUM CHLORIDE IN NACL 20-0.9 MEQ/L-% IV SOLN
INTRAVENOUS | Status: DC
  Filled 2020-07-14 (×6): qty 1000

## 2020-07-14 MED ORDER — SODIUM CHLORIDE 0.9 % IV SOLN
Freq: Once | INTRAVENOUS | Status: AC
  Administered 2020-07-14: 15:00:00 18 mg via INTRAVENOUS
  Filled 2020-07-14: qty 4

## 2020-07-14 MED ORDER — SODIUM CHLORIDE 0.9 % IV SOLN: INTRAVENOUS | Status: DC

## 2020-07-14 MED ORDER — HOT PACK MISC ONCOLOGY
1.0000 | Freq: Once | Status: DC | PRN
  Filled 2020-07-14: qty 1

## 2020-07-14 MED ORDER — FAMCICLOVIR 500 MG PO TABS
500.0000 mg | ORAL_TABLET | Freq: Every day | ORAL | Status: DC
  Administered 2020-07-15 – 2020-07-18 (×4): 500 mg via ORAL
  Filled 2020-07-14 (×5): qty 1

## 2020-07-14 MED ORDER — HYDROCODONE-HOMATROPINE 5-1.5 MG/5ML PO SYRP: 5.0000 mL | ORAL_SOLUTION | Freq: Four times a day (QID) | ORAL | Status: DC | PRN

## 2020-07-14 MED ORDER — APIXABAN 5 MG PO TABS
5.0000 mg | ORAL_TABLET | Freq: Two times a day (BID) | ORAL | Status: DC
  Administered 2020-07-14 – 2020-07-18 (×8): 5 mg via ORAL
  Filled 2020-07-14 (×8): qty 1

## 2020-07-14 MED ORDER — COLD PACK MISC ONCOLOGY
1.0000 | Freq: Once | Status: DC | PRN
  Filled 2020-07-14: qty 1

## 2020-07-14 MED ORDER — SODIUM BICARBONATE/SODIUM CHLORIDE MOUTHWASH
1.0000 "application " | OROMUCOSAL | Status: DC
  Administered 2020-07-14 – 2020-07-18 (×21): 1 via OROMUCOSAL
  Filled 2020-07-14: qty 1000

## 2020-07-14 MED ORDER — CHLORHEXIDINE GLUCONATE 0.12 % MT SOLN
15.0000 mL | Freq: Two times a day (BID) | OROMUCOSAL | Status: DC
  Administered 2020-07-15 – 2020-07-18 (×5): 15 mL via OROMUCOSAL
  Filled 2020-07-14 (×7): qty 15

## 2020-07-14 NOTE — H&P (Addendum)
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 is a 44 year old male who was seen for initial consult during his hospitalization on 04/25/2020.  The patient had progressive shortness of breath over a 6-week period of time and a CT of the chest performed on 04/24/2020 showed a large malignant appearing left anterior mediastinal mass measuring 14.6 x 11.7 x 18.5 cm, large left pleural effusion, and small pericardial effusion.  He underwent several thoracenteses during his hospitalization as well as a CT-guided biopsy of the mediastinal mass.  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  Echocardiogram performed on 04/27/2020 showed a LVEF of 60 to 65%.  We were unable to perform a PET scan prior to his first dose of chemotherapy, but he had a PET scan performed on 05/15/2020 following cycle #1.  This showed a considerable reduction in size of the anterior mediastinal mass in the left pleural tumor deposits.  There was splenic activity and marrow activity likely due to G-CSF.  Since his last cycle of chemotherapy, patient was admitted to the hospital for hypoxia.  CTA chest showed pulmonary emboli in the posterior segment of the lower pulmonary artery branches bilaterally and he was noted to have widespread airspace opacities throughout the lungs with consolidation in the lung bases concerning for atypical pneumonia versus hypersensitivity/chemotherapy associated pneumonitis.  Bronchoscopy was performed last admission and negative.  He was placed on Levaquin and a prednisone taper.  Pulmonology felt pneumonitis could be related to G-CSF.  Cycle 4 of his chemotherapy was delayed by 1 week due to this hospital admission.  The  patient presents to the hospital today for admission for cycle #4 of chemotherapy.  He is feeling well overall.  He has not had any recent fevers or chills.  Dyspnea continues to improve.  Denies cough. He has no longer using Hycodan. Denies mucositis.  Denies chest pain.  Denies abdominal pain, nausea, vomiting, constipation, diarrhea.  No bleeding reported.  The patient is seen today for admission for cycle #4 of EPOCH-R.      Past Medical History:  Diagnosis Date  . Arthritis of right knee   . ED (erectile dysfunction)   . 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 OSA (obstructive sleep apnea) 2007   with AHI 32/hr                      . IR IMAGING GUIDED PORT INSERTION  05/07/2020  . IR THORACENTESIS ASP PLEURAL SPACE W/IMG GUIDE  04/25/2020  . IR THORACENTESIS ASP PLEURAL SPACE W/IMG GUIDE  05/07/2020  . REFRACTIVE SURGERY  09/2012   bilateral   . THORACENTESIS  04/25/2020   Procedure: THORACENTESIS;  Surgeon: Candee Furbish, MD;  [facility];  Service: Pulmonary;;  . VIDEO BRONCHOSCOPY WITH ENDOBRONCHIAL ULTRASOUND N/A 04/25/2020   Procedure: VIDEO BRONCHOSCOPY WITH ENDOBRONCHIAL ULTRASOUND;  Surgeon: Candee Furbish, MD;  Location: [facility] ENDOSCOPY;  Service: Pulmonary;  Laterality: N/A;         Family History  Problem Relation Age of Onset  . Sleep apnea Other   . Hypertension Other   . Emphysema Father   . Hypertension Father   Relation Age of Onset  . Sleep apnea Other   . Hypertension Other   .  Emphysema Father   . Hypertension Father      Socioeconomic History  . Marital status: Married    Spouse name: Not on file  . Number of children: 0  . Years of education: Not on file  . Highest education level: Not on file  Occupational History  . Not on file  Tobacco Use  . Smoking status: Never Smoker  . Smokeless tobacco: Never Used  Vaping Use  . Vaping Use: Never used  Substance and Sexual Activity  . Alcohol use: Yes     Alcohol/week: 0.0 standard drinks    Comment: occ  . Drug use: No  . Sexual activity: Yes    Partners: Female  Other Topics Concern  . Not on file  Social History Narrative  . Not on file   Social Determinants of Health   Financial Resource Strain:   . Difficulty of Paying Living Expenses: Not on file  Food Insecurity:   . Worried About Charity fundraiser in the Last Year: Not on file  . Ran Out of Food in the Last Year: Not on file  Transportation Needs:   . Lack of Transportation (Medical): Not on file  . Lack of Transportation (Non-Medical): Not on file  Physical Activity:   . Days of Exercise per Week: Not on file  . Minutes of Exercise per Session: Not on file  Stress:   . Feeling of Stress : Not on file  Social Connections:   . Frequency of Communication with Friends and Family: Not on file  . Frequency of Social Gatherings with Friends and Family: Not on file  . Attends Religious Services: Not on file  . Active Member of Clubs or Organizations: Not on file  . Attends Archivist Meetings: Not on file  . Marital Status: Not on file  Intimate Partner Violence:   . Fear of Current or Ex-Partner: Not on file  . Emotionally Abused: Not on file  . Physically Abused: Not on file  . Sexually Abused: Not on file  :  Review of Systems: A comprehensive 14 point review of systems was negative except as noted in the HPI.  Exam: Patient Vitals for the past 24 hrs:  BP Temp Temp src Pulse Resp SpO2 Height Weight  06/16/20 0823 114/86 98.2 F (36.8 C) Oral (!) 107 19 100 % 5\' 9"  (1.753 m) 84.4 kg    General:  well-nourished in no acute distress.   Eyes:  no scleral icterus.   ENT:  There were no oropharyngeal lesions.   Neck was without thyromegaly.   Lymphatics:  Negative cervical, supraclavicular or axillary adenopathy.   Respiratory: Clear to auscultation bilaterally Cardiovascular:  Regular rate and rhythm, S1/S2, without murmur, rub or gallop.  There was no  pedal edema.   GI:  abdomen was soft, flat, nontender, nondistended, without organomegaly.   Musculoskeletal: Strength symmetrical in the upper and lower extremities. Skin exam was without echymosis, petichae.   Neuro exam was nonfocal. Patient was alert and oriented.  Attention was good.   Language was appropriate.  Mood was normal without depression.  Speech was not pressured.  Thought content was not tangential.     Lab Results  Component Value Date   WBC 42.6 (H) 06/10/2020   HGB 10.0 (L) 06/10/2020   HCT 31.1 (L) 06/10/2020   PLT 189 06/10/2020   GLUCOSE 116 (H) 06/10/2020   CHOL 170 04/06/2019   TRIG 58.0 04/06/2019   HDL 43.20 04/06/2019  LDLCALC 115 (H) 04/06/2019   ALT 18 06/10/2020   AST 20 06/10/2020   NA 139 06/10/2020   K 3.5 06/10/2020   CL 104 06/10/2020   CREATININE 1.06 06/10/2020   BUN 13 06/10/2020   CO2 28 06/10/2020    X-ray chest PA and lateral  Result Date: 05/26/2020 CLINICAL DATA:  History of mediastinal mass with ongoing cough. EXAM: CHEST - 2 VIEW COMPARISON:  PET-CT 05/15/2020 FINDINGS: Right chest wall port a catheter noted with tip at the cavoatrial junction. Heart size appears within normal limits. Anterior mediastinal mass has also decreased in size in the interval. Significant interval reduction in volume of left pleural effusion with improved aeration to the left midlung and left base. Residual subsegmental atelectasis noted in the left base. The visualized skeletal structures are unremarkable. IMPRESSION: 1. Interval decrease in size of anterior mediastinal mass. 2. Significant decrease in volume of left pleural effusion. 3. Mild residual subsegmental atelectasis noted in the left base. Electronically Signed   By: Kerby Moors M.D.   On: 05/26/2020 11:37     X-ray chest PA and lateral  Result Date: 05/26/2020 CLINICAL DATA:  History of mediastinal mass with ongoing cough. EXAM: CHEST - 2 VIEW COMPARISON:  PET-CT 05/15/2020 FINDINGS: Right  chest wall port a catheter noted with tip at the cavoatrial junction. Heart size appears within normal limits. Anterior mediastinal mass has also decreased in size in the interval. Significant interval reduction in volume of left pleural effusion with improved aeration to the left midlung and left base. Residual subsegmental atelectasis noted in the left base. The visualized skeletal structures are unremarkable. IMPRESSION: 1. Interval decrease in size of anterior mediastinal mass. 2. Significant decrease in volume of left pleural effusion. 3. Mild residual subsegmental atelectasis noted in the left base. Electronically Signed   By: Kerby Moors M.D.   On: 05/26/2020 11:37   Assessment and Plan:  1.  Primary mediastinal B-cell lymphoma 2.  Pleural effusion secondary to #1, improved 3.  Cough due to #1 and #2, improved  -Admit to inpatient oncology unit.  Plan to proceed with cycle #4 of systemic chemotherapy as planned.  He is aware of the possible adverse effects and is agreeable to proceed.  We will review labs prior to chemotherapy administration. -Regular diet. -He will continue home dose of Eliquis. -Continue home medications. -The patient has antiemetics and stool softeners available to him. -Restaging PET scan as scheduled next week as an outpatient on 07/22/2020.  Travis Bussing, DNP, AGPCNP-BC, AOCNP  ADDENDUM: I agree with the above note.  We will have to hold the Neulasta.  He may have had a pneumonitis from the Neulasta.  He still needs to have his PET scan done.  This will be done after this cycle.  His chest x-ray looks fine right now.  He is on Eliquis.  He will continue the Eliquis.  His labs look okay.  We will move ahead with fourth cycle of chemotherapy today.  I know that he will get fantastic care from all the staff up on 6 E.  Lattie Haw, MD  3 [place] 1:2

## 2020-07-14 NOTE — Progress Notes (Signed)
Patient due for chemotherapy.  Patient's BSA, chemo dosages, total VTBI, expiration date/time, 2 pharmacy signatures, and patient identifiers independently verified by me and by Lottie Dawson, RN.

## 2020-07-14 NOTE — Progress Notes (Signed)
°   07/14/20 0847  Assess: MEWS Score  Temp 98.3 F (36.8 C)  BP 106/74  Pulse Rate (!) 120  Resp 16  SpO2 99 %  O2 Device Room Air  Patient Activity (if Appropriate) In bed  Assess: MEWS Score  MEWS Temp 0  MEWS Systolic 0  MEWS Pulse 2  MEWS RR 0  MEWS LOC 0  MEWS Score 2  MEWS Score Color Yellow  Assess: if the MEWS score is Yellow or Red  Were vital signs taken at a resting state? Yes  Focused Assessment No change from prior assessment  Early Detection of Sepsis Score *See Row Information* Low  Treat  MEWS Interventions Other (Comment)  Pain Scale 0-10  Pain Score 0  Escalate  MEWS: Escalate Yellow: discuss with charge nurse/RN and consider discussing with provider and RRT  Notify: Charge Nurse/RN  Name of Charge Nurse/RN Notified Lottie Dawson  Date Charge Nurse/RN Notified 07/14/20  Notify: Provider  Provider Name/Title Mikey Bussing, NP  Date Provider Notified 07/14/20  Time Provider Notified [phone]  Notification Type Face-to-face  Notification Reason Change in status  Response No new orders  Date of Provider Response 07/14/20

## 2020-07-14 NOTE — Progress Notes (Signed)
   07/14/20 2126  Assess: MEWS Score  Temp 98.8 F (37.1 C)  BP 122/83  Pulse Rate (!) 118  Resp 18  SpO2 96 %  O2 Device Room Air  Assess: MEWS Score  MEWS Temp 0  MEWS Systolic 0  MEWS Pulse 2  MEWS RR 0  MEWS LOC 0  MEWS Score 2  MEWS Score Color Yellow  Assess: if the MEWS score is Yellow or Red  Were vital signs taken at a resting state? Yes  Focused Assessment No change from prior assessment  Early Detection of Sepsis Score *See Row Information* Low  MEWS guidelines implemented *See Row Information* Yes  Treat  MEWS Interventions Other (Comment) (Curcio, NP aware from prior shift)  Escalate  MEWS: Escalate Yellow: discuss with charge nurse/RN and consider discussing with provider and RRT Mena Pauls, NP aware from prior shift)  Notify: Charge Nurse/RN  Name of Charge Nurse/RN Notified Carmin Richmond, RN  Date Charge Nurse/RN Notified 07/14/20  Time Charge Nurse/RN Notified 2126

## 2020-07-15 ENCOUNTER — Encounter: Payer: Self-pay | Admitting: *Deleted

## 2020-07-15 LAB — URINE CULTURE: Culture: NO GROWTH

## 2020-07-15 MED ORDER — VINCRISTINE SULFATE CHEMO INJECTION 1 MG/ML
Freq: Once | INTRAVENOUS | Status: AC
  Filled 2020-07-15 (×2): qty 11

## 2020-07-15 MED ORDER — SODIUM CHLORIDE 0.9 % IV SOLN
Freq: Once | INTRAVENOUS | Status: AC
  Administered 2020-07-15: 14:00:00 18 mg via INTRAVENOUS
  Filled 2020-07-15: qty 4

## 2020-07-15 MED ORDER — CHLORHEXIDINE GLUCONATE CLOTH 2 % EX PADS
6.0000 | MEDICATED_PAD | Freq: Every day | CUTANEOUS | Status: DC
  Administered 2020-07-15 – 2020-07-18 (×4): 6 via TOPICAL

## 2020-07-15 NOTE — Progress Notes (Signed)
Agree with the note by Zandra Abts.

## 2020-07-15 NOTE — Progress Notes (Signed)
Patient due for day 2 chemotherapy.  Patient's BSA, chemo dosages, total VTBI, expiration date/time, 2 pharmacy signatures, and patient identifiers independently verified by me and by Lottie Dawson, RN.

## 2020-07-15 NOTE — Progress Notes (Signed)
Travis Duran looks good.  He feels good.  His chest x-ray looked fantastic.  There is no infiltrate.  He has had no problems with cough.  Is no chest wall pain.  There has been no fever.  He is on day #2 of his chemotherapy.  There is no nausea or vomiting.  He has had no diarrhea.  He has had no rashes.  For some reason, he thinks that the Eliquis has been stopped.  That will see where it has been stopped.  He needs the Eliquis because of thromboembolic disease.  His appetite is doing okay.  He has had no mouth sores.  He is on mouth rinses.  His admission labs looked okay.  His liver function studies were slightly elevated.  I am not too worried about that for right now.  He will have a PET scan next Tuesday.  His vital signs look stable.  Temperature 97.9.  102 is a pulse.  Blood pressure 108/73.  His head neck exam shows no mucositis.  There is no adenopathy in the neck.  There is no scleral icterus.  Lungs are clear bilaterally.  No wheezes are noted.  Cardiac exam regular rate and rhythm.  He has no murmurs, rubs or bruits.  Abdomen is soft.  Bowel sounds are present.  There is no guarding or rebound tenderness.  Extremities shows no clubbing, cyanosis or edema.  Travis Duran has a mediastinal large cell lymphoma.  This is cycle #4 of chemotherapy with R-EPOCH.  He also has thromboembolic disease.  He had pulmonary emboli.  There was a possibility of pneumonitis from Neulasta.  We will have to hold on the Neulasta after this cycle of chemotherapy.  We will have to be very cautious with his blood counts.  He will need to be on full antibiotic prophylaxis.  I know that he will get outstanding care from all the staff on 6 E.  I appreciate their help and their compassion.  Lattie Haw, MD  1 Corinthians 13:13

## 2020-07-15 NOTE — Progress Notes (Signed)
Oncology Nurse Navigator Documentation  Oncology Nurse Navigator Flowsheets 07/15/2020  Abnormal Finding Date -  Confirmed Diagnosis Date -  Diagnosis Status -  Planned Course of Treatment -  Phase of Treatment -  Chemotherapy Actual Start Date: -  Navigator Follow Up Date: 07/17/2020  Navigator Follow Up Reason: Appointment Review  Navigator Location [facility]  Navigator Encounter Type Appt/Treatment Plan Review  Telephone -  Treatment Initiated Date -  Patient Visit Type MedOnc  Treatment Phase Active Tx  Barriers/Navigation Needs Coordination of Care;Education  Education -  Interventions None Required  Acuity Level 3-Moderate Needs (3-4 Barriers Identified)  Coordination of Care -  Education Method -  Support Groups/Services Friends and Family  Time Spent with Patient 15

## 2020-07-16 DIAGNOSIS — I2699 Other pulmonary embolism without acute cor pulmonale: Secondary | ICD-10-CM

## 2020-07-16 MED ORDER — VINCRISTINE SULFATE CHEMO INJECTION 1 MG/ML
Freq: Once | INTRAVENOUS | Status: AC
  Filled 2020-07-16: qty 11

## 2020-07-16 MED ORDER — SODIUM CHLORIDE 0.9 % IV SOLN
Freq: Once | INTRAVENOUS | Status: AC
  Administered 2020-07-16: 13:00:00 8 mg via INTRAVENOUS
  Filled 2020-07-16: qty 4

## 2020-07-16 NOTE — Progress Notes (Signed)
So far, Mr. Santoli is doing well.  Chemotherapy is going without problems.  There is no nausea or vomiting.  He has had no diarrhea.  There is no cough or chest wall pain.  He has had no bleeding.  His face is a little bit flushed because of the steroids.  His appetite is quite good.  He is up and about walking.Marland Kitchen  He is on Eliquis.  He needs to be on Eliquis at therapeutic dose for the pulmonary emboli.  His physical exam shows temperature of 98.  Pulse 93.  Blood pressure 115/71.  His oral exam shows no mucositis.  He has no thrush.  Lungs are clear bilaterally.  Cardiac exam regular rate and rhythm with no murmurs, rubs or bruits.  Abdomen is soft.  Bowel sounds are present.  He has no fluid wave.  Extremities shows no clubbing, cyanosis or edema.  Neurological exam is nonfocal.  We will continue with chemotherapy.  It will be day 3 of chemotherapy.  As always, he really has done well.  He has had no problems.  I doubt that he will have problems.  After treatment, he will not get colony-stimulating factor as this may have caused his pneumonitis.  We will just have to be cautious with him and leukopenia.  Lattie Haw, MD  Lurena Joiner 1:32

## 2020-07-16 NOTE — Progress Notes (Signed)
Chemotherapy dosages and calculations verified with Adela Ports, RN.

## 2020-07-17 DIAGNOSIS — Z7901 Long term (current) use of anticoagulants: Secondary | ICD-10-CM

## 2020-07-17 LAB — CBC WITH DIFFERENTIAL/PLATELET
Abs Immature Granulocytes: 0.13 10*3/uL — ABNORMAL HIGH (ref 0.00–0.07)
Basophils Absolute: 0 10*3/uL (ref 0.0–0.1)
Basophils Relative: 0 %
Eosinophils Absolute: 0 10*3/uL (ref 0.0–0.5)
Eosinophils Relative: 0 %
HCT: 31 % — ABNORMAL LOW (ref 39.0–52.0)
Hemoglobin: 10 g/dL — ABNORMAL LOW (ref 13.0–17.0)
Immature Granulocytes: 1 %
Lymphocytes Relative: 3 %
Lymphs Abs: 0.4 10*3/uL — ABNORMAL LOW (ref 0.7–4.0)
MCH: 29.6 pg (ref 26.0–34.0)
MCHC: 32.3 g/dL (ref 30.0–36.0)
MCV: 91.7 fL (ref 80.0–100.0)
Monocytes Absolute: 1.3 10*3/uL — ABNORMAL HIGH (ref 0.1–1.0)
Monocytes Relative: 9 %
Neutro Abs: 13.1 10*3/uL — ABNORMAL HIGH (ref 1.7–7.7)
Neutrophils Relative %: 87 %
Platelets: 322 10*3/uL (ref 150–400)
RBC: 3.38 MIL/uL — ABNORMAL LOW (ref 4.22–5.81)
RDW: 22.5 % — ABNORMAL HIGH (ref 11.5–15.5)
WBC: 15 10*3/uL — ABNORMAL HIGH (ref 4.0–10.5)
nRBC: 0 % (ref 0.0–0.2)

## 2020-07-17 LAB — COMPREHENSIVE METABOLIC PANEL
ALT: 61 U/L — ABNORMAL HIGH (ref 0–44)
AST: 22 U/L (ref 15–41)
Albumin: 3.1 g/dL — ABNORMAL LOW (ref 3.5–5.0)
Alkaline Phosphatase: 58 U/L (ref 38–126)
Anion gap: 10 (ref 5–15)
BUN: 21 mg/dL — ABNORMAL HIGH (ref 6–20)
CO2: 22 mmol/L (ref 22–32)
Calcium: 8.9 mg/dL (ref 8.9–10.3)
Chloride: 108 mmol/L (ref 98–111)
Creatinine, Ser: 0.75 mg/dL (ref 0.61–1.24)
GFR, Estimated: >60 mL/min (ref >60)
Glucose, Bld: 129 mg/dL — ABNORMAL HIGH (ref 70–99)
Potassium: 3.6 mmol/L (ref 3.5–5.1)
Sodium: 140 mmol/L (ref 135–145)
Total Bilirubin: 0.7 mg/dL (ref 0.3–1.2)
Total Protein: 5.1 g/dL — ABNORMAL LOW (ref 6.5–8.1)

## 2020-07-17 LAB — LACTATE DEHYDROGENASE: LDH: 129 U/L (ref 98–192)

## 2020-07-17 MED ORDER — SODIUM CHLORIDE 0.9 % IV SOLN
Freq: Once | INTRAVENOUS | Status: AC
  Administered 2020-07-17: 10:00:00 18 mg via INTRAVENOUS
  Filled 2020-07-17: qty 4

## 2020-07-17 MED ORDER — VINCRISTINE SULFATE CHEMO INJECTION 1 MG/ML
Freq: Once | INTRAVENOUS | Status: AC
  Filled 2020-07-17: qty 11

## 2020-07-17 NOTE — Progress Notes (Signed)
Travis Duran is doing well.  Today is his fourth day of chemotherapy.  He has had no problems so far.  There is no diarrhea.  There is no nausea or vomiting.  He had tacos for dinner.  He has had no cough.  There is no shortness of breath.  He is walking.  He is on Eliquis.  He is doing well on the Eliquis.  There is no bleeding.  He has had no headache.  He has had no mouth sores.  He is very diligent with oral care.  The labs back so far today show white cell count of 15.  Hemoglobin 10 hematocrit 31 platelet count 10/20/1998.  He has had no problems with rashes.  There is no fever.  On his physical exam, his vital signs temperature 97.8.  Pulse 78.  Blood pressure 117/82.  Oxygen saturation 99%.  His oral exam shows no mucositis.  Lungs are clear bilaterally.  He has good air movement bilaterally.  Cardiac exam regular rate and rhythm with no murmurs, rubs or bruits.  Abdomen is soft.  Bowel sounds are present.  He has no guarding or rebound tenderness.  There is no fluid wave.  There is no palpable liver or spleen tip.  Extremities shows no clubbing, cyanosis or edema.  Neurological exam is nonfocal.  Skin exam shows no rashes.  Mr. Hetzer will have day 4 of treatment today.  Tomorrow will be Rituxan and then he will be able to go home.  Again, we are not going to give Neulasta given that he may have had pneumonitis with Neulasta last time.  His PET scan is set up for next week.  Hopefully this will be normalized.  As always, the care that he is gotten from the staff on [address]. has been phenomenal.  The staff is very compassionate and very professional.  Lattie Haw, MD Jenny Reichmann 1:29

## 2020-07-17 NOTE — TOC Initial Note (Signed)
Transition of Care [facility]) - Initial/Assessment Note    Patient Details  Name: Travis Duran MRN: [id number] Date of Birth: [birth year]  [facility] Care [facility]) CM/SW Contact:    Lynnell Catalan, RN Phone Number: 07/17/2020, 11:37 AM                Expected Discharge Plan: Home/Self Care Barriers to Discharge: Continued Medical Work up   Expected Discharge Plan and Services Expected Discharge Plan: Home/Self Care       Living arrangements for the past 2 months: Single Family Home Expected Discharge Date:  (unknown)                  Prior Living Arrangements/Services Living arrangements for the past 2 months: Single Family Home Lives with:: Spouse          Need for Family Participation in Patient Care: Yes (Comment) Care giver support system in place?: Yes (comment)      Activities of Daily Living Home Assistive Devices/Equipment: None ADL Screening (condition at time of admission) Patient's cognitive ability adequate to safely complete daily activities?: Yes Is the patient deaf or have difficulty hearing?: No Does the patient have difficulty seeing, even when wearing glasses/contacts?: No Does the patient have difficulty concentrating, remembering, or making decisions?: No Patient able to express need for assistance with ADLs?: Yes Does the patient have difficulty dressing or bathing?: No Independently performs ADLs?: Yes (appropriate for developmental age) Does the patient have difficulty walking or climbing stairs?: No Weakness of Legs: None Weakness of Arms/Hands: None   Emotional Assessment Appearance:: Appears stated age    Admission diagnosis:  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 [place] [address] Phone: [phone] Fax: [phone]    Readmission Risk Interventions Readmission Risk Prevention Plan 07/17/2020  Transportation Screening Complete  PCP or Specialist Appt within 3-5 Days Not Complete  Not Complete comments Not ready for dc  HRI or [place] Not Complete  HRI or Home Care Consult comments Not needed  Social Work Consult for [place] Planning/Counseling Complete  Palliative Care Screening Not Applicable  Medication Review Press photographer) Complete  Some recent data might be hidden

## 2020-07-18 DIAGNOSIS — E876 Hypokalemia: Secondary | ICD-10-CM

## 2020-07-18 LAB — CBC WITH DIFFERENTIAL/PLATELET
Abs Immature Granulocytes: 0.07 10*3/uL (ref 0.00–0.07)
Basophils Absolute: 0 10*3/uL (ref 0.0–0.1)
Basophils Relative: 0 %
Eosinophils Absolute: 0 10*3/uL (ref 0.0–0.5)
Eosinophils Relative: 0 %
HCT: 30.4 % — ABNORMAL LOW (ref 39.0–52.0)
Hemoglobin: 9.9 g/dL — ABNORMAL LOW (ref 13.0–17.0)
Immature Granulocytes: 1 %
Lymphocytes Relative: 4 %
Lymphs Abs: 0.4 10*3/uL — ABNORMAL LOW (ref 0.7–4.0)
MCH: 29.4 pg (ref 26.0–34.0)
MCHC: 32.6 g/dL (ref 30.0–36.0)
MCV: 90.2 fL (ref 80.0–100.0)
Monocytes Absolute: 0.4 10*3/uL (ref 0.1–1.0)
Monocytes Relative: 4 %
Neutro Abs: 8.8 10*3/uL — ABNORMAL HIGH (ref 1.7–7.7)
Neutrophils Relative %: 91 %
Platelets: 299 10*3/uL (ref 150–400)
RBC: 3.37 MIL/uL — ABNORMAL LOW (ref 4.22–5.81)
RDW: 21.4 % — ABNORMAL HIGH (ref 11.5–15.5)
WBC: 9.7 10*3/uL (ref 4.0–10.5)
nRBC: 0 % (ref 0.0–0.2)

## 2020-07-18 LAB — COMPREHENSIVE METABOLIC PANEL
ALT: 48 U/L — ABNORMAL HIGH (ref 0–44)
AST: 17 U/L (ref 15–41)
Albumin: 3 g/dL — ABNORMAL LOW (ref 3.5–5.0)
Alkaline Phosphatase: 55 U/L (ref 38–126)
Anion gap: 9 (ref 5–15)
BUN: 19 mg/dL (ref 6–20)
CO2: 24 mmol/L (ref 22–32)
Calcium: 8.9 mg/dL (ref 8.9–10.3)
Chloride: 104 mmol/L (ref 98–111)
Creatinine, Ser: 0.76 mg/dL (ref 0.61–1.24)
GFR, Estimated: >60 mL/min (ref >60)
Glucose, Bld: 124 mg/dL — ABNORMAL HIGH (ref 70–99)
Potassium: 3.3 mmol/L — ABNORMAL LOW (ref 3.5–5.1)
Sodium: 137 mmol/L (ref 135–145)
Total Bilirubin: 0.7 mg/dL (ref 0.3–1.2)
Total Protein: 5.1 g/dL — ABNORMAL LOW (ref 6.5–8.1)

## 2020-07-18 LAB — LACTATE DEHYDROGENASE: LDH: 114 U/L (ref 98–192)

## 2020-07-18 LAB — MAGNESIUM: Magnesium: 1.9 mg/dL (ref 1.7–2.4)

## 2020-07-18 MED ORDER — SODIUM CHLORIDE 0.9 % IV SOLN
Freq: Once | INTRAVENOUS | Status: AC
  Administered 2020-07-18: 10:00:00 16 mg via INTRAVENOUS
  Filled 2020-07-18: qty 8

## 2020-07-18 MED ORDER — POTASSIUM CHLORIDE CRYS ER 20 MEQ PO TBCR
20.0000 meq | EXTENDED_RELEASE_TABLET | Freq: Once | ORAL | Status: AC
  Administered 2020-07-18: 10:00:00 20 meq via ORAL
  Filled 2020-07-18: qty 1

## 2020-07-18 MED ORDER — HEPARIN SOD (PORK) LOCK FLUSH 100 UNIT/ML IV SOLN
500.0000 [IU] | INTRAVENOUS | Status: AC | PRN
  Administered 2020-07-18: 16:00:00 500 [IU]
  Filled 2020-07-18: qty 5

## 2020-07-18 MED ORDER — SODIUM CHLORIDE 0.9 % IV SOLN
375.0000 mg/m2 | Freq: Once | INTRAVENOUS | Status: AC
  Administered 2020-07-18: 12:00:00 800 mg via INTRAVENOUS
  Filled 2020-07-18: qty 50

## 2020-07-18 MED ORDER — ACETAMINOPHEN 325 MG PO TABS
650.0000 mg | ORAL_TABLET | Freq: Once | ORAL | Status: AC
  Administered 2020-07-18: 11:00:00 650 mg via ORAL
  Filled 2020-07-18: qty 2

## 2020-07-18 MED ORDER — CIPROFLOXACIN HCL 500 MG PO TABS
500.0000 mg | ORAL_TABLET | Freq: Every day | ORAL | Status: DC
  Administered 2020-07-18: 08:00:00 500 mg via ORAL
  Filled 2020-07-18: qty 1

## 2020-07-18 MED ORDER — PROCHLORPERAZINE MALEATE 10 MG PO TABS: 10.0000 mg | ORAL_TABLET | Freq: Four times a day (QID) | ORAL | 0 refills | Status: DC | PRN

## 2020-07-18 MED ORDER — SODIUM CHLORIDE 0.9 % IV SOLN
750.0000 mg/m2 | Freq: Once | INTRAVENOUS | Status: AC
  Administered 2020-07-18: 11:00:00 1580 mg via INTRAVENOUS
  Filled 2020-07-18: qty 79

## 2020-07-18 MED ORDER — DIPHENHYDRAMINE HCL 50 MG PO CAPS
50.0000 mg | ORAL_CAPSULE | Freq: Once | ORAL | Status: AC
  Administered 2020-07-18: 11:00:00 50 mg via ORAL
  Filled 2020-07-18: qty 1

## 2020-07-18 NOTE — Progress Notes (Signed)
So far, everything is going quite well.  Travis Duran will go home today after the Rituxan.  He will NOT get Neulasta because of the possibility of pneumonitis after the last dose.  I am going to start him on ciprofloxacin today prophylactically.  He has had no nausea or vomiting.  There are no mouth sores.  He has had no cough or shortness of breath.  There is no chest wall pain.  He has had no change in bowel or bladder habits.  He has had no rashes.  There is no leg swelling.  His labs today look okay.  White cell count 9.7.  Hemoglobin 9.9.  Platelet count 299,000.  His metabolic panel still pending.  His vital signs are stable.  Temperature 97.4.  Pulse 72.  Blood pressure 126/89.  Oral exam shows no mucositis.  Lungs are clear bilaterally.  He has good air movement bilaterally.  Cardiac exam regular rate and rhythm with no murmurs, rubs or bruits.  Abdomen is soft.  He has good bowel sounds.  There is no guarding or rebound tenderness.  Extremities shows no clubbing, cyanosis or edema.  Neurological exam is nonfocal.  Again, Travis Duran has completed his fourth cycle of chemotherapy.  He is done very nicely.  He will have his PET scan done on Tuesday, December 21.  We will plan to have him come in for his next cycle of treatment in 3 weeks.  I told him to make sure that he avoids crowds since he is not currently having Neulasta.  He will have a longer period of neutropenia.  He will be on triple protection with Famvir, Diflucan, and ciprofloxacin.  I know that he is gotten fantastic care from all the staff on [address].  Lattie Haw, MD  Elta Guadeloupe 1:11

## 2020-07-18 NOTE — Discharge Summary (Addendum)
Discharge Summary  Patient ID: Travis Duran MRN: [id number] DOB/AGE: 11-02-1975 44 y.o.  Admit date: 07/14/2020 Discharge date: 07/18/2020  Discharge Diagnoses:  Active Problems:   Encounter for antineoplastic chemotherapy   Diffuse large B cell lymphoma ([place])   Discharged Condition: good  Discharge Labs:   CBC    Component Value Date/Time   WBC 9.7 07/18/2020 0623   RBC 3.37 (L) 07/18/2020 0623   HGB 9.9 (L) 07/18/2020 0623   HGB 9.8 (L) 07/02/2020 1135   HCT 30.4 (L) 07/18/2020 0623   PLT 299 07/18/2020 0623   PLT 386 07/02/2020 1135   MCV 90.2 07/18/2020 0623   MCH 29.4 07/18/2020 0623   MCHC 32.6 07/18/2020 0623   RDW 21.4 (H) 07/18/2020 0623   LYMPHSABS 0.4 (L) 07/18/2020 0623   MONOABS 0.4 07/18/2020 0623   EOSABS 0.0 07/18/2020 0623   BASOSABS 0.0 07/18/2020 0623   CMP Latest Ref Rng & Units 07/18/2020 07/17/2020 07/14/2020  Glucose 70 - 99 mg/dL 124(H) 129(H) 145(H)  BUN 6 - 20 mg/dL 19 21(H) 19  Creatinine 0.61 - 1.24 mg/dL 0.76 0.75 0.85  Sodium 135 - 145 mmol/L 137 140 137  Potassium 3.5 - 5.1 mmol/L 3.3(L) 3.6 3.8  Chloride 98 - 111 mmol/L 104 108 104  CO2 22 - 32 mmol/L 24 22 24   Calcium 8.9 - 10.3 mg/dL 8.9 8.9 8.7(L)  Total Protein 6.5 - 8.1 g/dL 5.1(L) 5.1(L) 5.6(L)  Total Bilirubin 0.3 - 1.2 mg/dL 0.7 0.7 0.2(L)  Alkaline Phos 38 - 126 U/L 55 58 72  AST 15 - 41 U/L 17 22 42(H)  ALT 0 - 44 U/L 48(H) 61(H) 71(H)   Diagnostics: 1.  07/14/2020-anterior mediastinal adenopathy, better seen on recent CT.  Lungs clear.  Heart size normal.  Consults: None  Procedures:  None  Disposition:  Discharge disposition: 01-Home or Self Care      Allergies as of 07/18/2020   No Known Allergies     Medication List    STOP taking these medications   predniSONE 5 MG tablet Commonly known as: DELTASONE     TAKE these medications   chlorhexidine 0.12 % solution Commonly known as: PERIDEX Use as directed 15 mLs in the mouth or throat 2 (two) times  daily as needed (twice daily during chemo).   ciprofloxacin 500 MG tablet Commonly known as: CIPRO Take 1 tablet (500 mg total) by mouth daily with breakfast. Resume after finishing Levaquin course.   Eliquis 5 MG Tabs tablet Generic drug: apixaban Take 5 mg by mouth 2 (two) times daily.   famciclovir 500 MG tablet Commonly known as: FAMVIR Take 1 tablet (500 mg total) by mouth daily.   fluconazole 100 MG tablet Commonly known as: DIFLUCAN Take 1 tablet (100 mg total) by mouth daily.   Melatonin 3 MG Caps Take 3 mg by mouth at bedtime.   ondansetron 4 MG tablet Commonly known as: ZOFRAN Take 1-2 tablets (4-8 mg total) by mouth every 8 (eight) hours as needed for nausea (not responsive to prochlorperazine (COMPAZINE)).   prochlorperazine 10 MG tablet Commonly known as: COMPAZINE Take 1 tablet (10 mg total) by mouth every 6 (six) hours as needed for nausea or vomiting.   sodium bicarbonate/sodium chloride Soln 1 application by Mouth Rinse route every 4 (four) hours.   tadalafil 10 MG tablet Commonly known as: Cialis Take 1 tablet (10 mg total) by mouth daily as needed for erectile dysfunction.        HPI:  Mr.  Travis Duran is a 44 year old male who was seen for initial consult during his hospitalization on 04/25/2020.  The patient had progressive shortness of breath over a 6-week period of time and a CT of the chest performed on 04/24/2020 showed a large malignant appearing left anterior mediastinal mass measuring 14.6 x 11.7 x 18.5 cm, large left pleural effusion, and small pericardial effusion.  He underwent several thoracenteses during his hospitalization as well as a CT-guided biopsy of the mediastinal mass.  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  Echocardiogram performed on 04/27/2020 showed a LVEF of 60 to 65%.    The patient was started on systemic  chemotherapy with EPOCH-R.    A PET scan performed after only 1 cycle of chemotherapy showed considerable reduction in the size of the anterior mediastinal mass and in the left pleural tumor deposits.  Following his third cycle of chemotherapy, the patient was admitted to the hospital for hypoxia.  CTA chest showed pulmonary emboli in the posterior segment of the lower pulmonary artery branches bilaterally and he was noted to have widespread airspace opacities throughout the lungs with consolidation in the lung bases concerning for atypical pneumonia versus hypersensitivity/chemotherapy associated pneumonitis.  Bronchoscopy was performed last admission and negative.  He was placed on Levaquin and a prednisone taper.  Pulmonology felt pneumonitis could be related to G-CSF.  Cycle 4 of his chemotherapy was delayed by 1 week due to this hospital admission.   The patient was seen on the day of admission for cycle #4 of his chemotherapy.  He felt well overall.  He was not having any fevers or chills.  Dyspnea continue to improve.  Denied cough.  He was no longer using Hycodan.  Denied mucositis, chest pain, abdominal pain, nausea, vomiting, constipation, diarrhea.  No bleeding reported.  The patient was admitted as scheduled for cycle #4 of EPOCH-R.   Hospital Course:  Mr. Proehl started his chemotherapy as planned on 12/13//2021.  He completed 5 days of his chemotherapy and also received rituximab on day 5.  He tolerated his treatment well overall.  Labs have remained stable and he has persistent, stable anemia.  LDH has normalized.  He was noted to have mild hypokalemia on the day of discharge with a potassium of 3.3 despite receiving potassium in his IV fluids.  He was given a dose of potassium chloride 20 mEq on the day of discharge.  Placed information about potassium rich foods on his AVS.  We will follow-up on potassium level as an outpatient  The patient was seen in the morning of 07/18/2020 and he has  remained stable.  Once his chemotherapy and rituximab are complete, he will be discharged home.  Physical Exam Vitals reviewed.  Constitutional:      General: He is not in acute distress. HENT:     Head: Normocephalic.     Nose: Nose normal.     Mouth/Throat:     Pharynx: Oropharynx is clear.  Eyes:     General: No scleral icterus.    Conjunctiva/sclera: Conjunctivae normal.  Cardiovascular:     Rate and Rhythm: Normal rate and regular rhythm.     Pulses: Normal pulses.     Heart sounds: Normal heart sounds.  Pulmonary:     Effort: Pulmonary effort is normal.     Lungs clear bilaterally Abdominal:     General: Bowel sounds are normal. There is no distension.  Palpations: Abdomen is soft.  Musculoskeletal:        General: Normal range of motion.  Skin:    General: Skin is warm and dry.  Neurological:     General: No focal deficit present.     Mental Status: He is alert and oriented to person, place, and time.  Psychiatric:        Mood and Affect: Mood normal.        Behavior: Behavior normal.        Thought Content: Thought content normal.        Judgment: Judgment normal.    The patient will not receive Neulasta following the cycle of chemotherapy due to concern of possible pneumonitis following the last dose.  Neutropenic precautions have been discussed with the patient including avoiding crowds.  Anticipate that he will have a longer period of neutropenia following this cycle of chemotherapy.  He will remain on prophylactic Famvir, fluconazole, and Cipro.  He has a follow-up PET scan scheduled for 07/22/2020.  We will arrange for outpatient follow-up at [facility].  He will be called with the date and time of the appointment.  Signed: Mikey Bussing 07/18/2020, 8:14 AM   ADDENDUM:  I agree with the above assessment!!!  I saw and examined Mr. Moncada this AM!  Lattie Haw, MD

## 2020-07-18 NOTE — Discharge Instructions (Signed)

## 2020-07-21 ENCOUNTER — Telehealth: Payer: Self-pay | Admitting: Hematology & Oncology

## 2020-07-21 ENCOUNTER — Encounter: Payer: Self-pay | Admitting: *Deleted

## 2020-07-21 NOTE — Progress Notes (Signed)
Oncology Nurse Navigator Documentation  Oncology Nurse Navigator Flowsheets 07/21/2020  Abnormal Finding Date -  Confirmed Diagnosis Date -  Diagnosis Status -  Planned Course of Treatment -  Phase of Treatment -  Chemotherapy Actual Start Date: -  Navigator Follow Up Date: 07/24/2020  Navigator Follow Up Reason: Other:  Navigator Location [facility]  Navigator Encounter Type Appt/Treatment Plan Review  Telephone -  Treatment Initiated Date -  Patient Visit Type MedOnc  Treatment Phase Active Tx  Barriers/Navigation Needs Coordination of Care;Education  Education -  Interventions Coordination of Care  Acuity Level 3-Moderate Needs (3-4 Barriers Identified)  Coordination of Care Appts  Education Method -  Support Groups/Services Friends and Family  Time Spent with Patient 15

## 2020-07-21 NOTE — Telephone Encounter (Signed)
I called and left VM for patient regrding appointments that have been added to his schedule.  I asked that he call back if we needed to change any of the dates/times per 12/20 sch msg

## 2020-07-22 ENCOUNTER — Other Ambulatory Visit: Payer: Self-pay

## 2020-07-22 ENCOUNTER — Ambulatory Visit
Admission: RE | Admit: 2020-07-22 | Discharge: 2020-07-22 | Disposition: A | Discharge status: Home / Self Care | Payer: BC Managed Care – PPO | Source: Ambulatory Visit | Attending: Family | Admitting: Family

## 2020-07-22 ENCOUNTER — Encounter: Payer: Self-pay | Admitting: *Deleted

## 2020-07-22 DIAGNOSIS — C8582 Other specified types of non-Hodgkin lymphoma, intrathoracic lymph nodes: Secondary | ICD-10-CM | POA: Diagnosis present

## 2020-07-22 DIAGNOSIS — E041 Nontoxic single thyroid nodule: Secondary | ICD-10-CM | POA: Diagnosis not present

## 2020-07-22 LAB — GLUCOSE, CAPILLARY: Glucose-Capillary: 93 mg/dL (ref 70–99)

## 2020-07-22 MED ORDER — FLUDEOXYGLUCOSE F - 18 (FDG) INJECTION
9.9800 | Freq: Once | INTRAVENOUS | Status: AC | PRN
  Administered 2020-07-22: 08:00:00 9.98 via INTRAVENOUS

## 2020-07-22 NOTE — Progress Notes (Signed)
Oncology Nurse Navigator Documentation  Oncology Nurse Navigator Flowsheets 07/22/2020  Abnormal Finding Date -  Confirmed Diagnosis Date -  Diagnosis Status -  Planned Course of Treatment -  Phase of Treatment -  Chemotherapy Actual Start Date: -  Navigator Follow Up Date: 07/24/2020  Navigator Follow Up Reason: Other:  Production assistant, radio Encounter Type Scan Review  Telephone -  Treatment Initiated Date -  Patient Visit Type MedOnc  Treatment Phase Active Tx  Barriers/Navigation Needs Coordination of Care;Education  Education -  Interventions None Required  Acuity Level 3-Moderate Needs (3-4 Barriers Identified)  Coordination of Care -  Education Method -  Support Groups/Services Friends and Family  Time Spent with Patient 15

## 2020-07-23 ENCOUNTER — Other Ambulatory Visit: Payer: Self-pay | Admitting: *Deleted

## 2020-07-23 DIAGNOSIS — C8332 Diffuse large B-cell lymphoma, intrathoracic lymph nodes: Secondary | ICD-10-CM

## 2020-07-24 ENCOUNTER — Other Ambulatory Visit: Payer: Self-pay

## 2020-07-24 ENCOUNTER — Inpatient Hospital Stay: Payer: BC Managed Care – PPO

## 2020-07-24 ENCOUNTER — Encounter: Payer: Self-pay | Admitting: *Deleted

## 2020-07-24 DIAGNOSIS — J910 Malignant pleural effusion: Secondary | ICD-10-CM | POA: Diagnosis not present

## 2020-07-24 DIAGNOSIS — C8332 Diffuse large B-cell lymphoma, intrathoracic lymph nodes: Secondary | ICD-10-CM

## 2020-07-24 DIAGNOSIS — Z7901 Long term (current) use of anticoagulants: Secondary | ICD-10-CM | POA: Diagnosis not present

## 2020-07-24 DIAGNOSIS — C8520 Mediastinal (thymic) large B-cell lymphoma, unspecified site: Secondary | ICD-10-CM | POA: Diagnosis not present

## 2020-07-24 DIAGNOSIS — Z79899 Other long term (current) drug therapy: Secondary | ICD-10-CM | POA: Diagnosis not present

## 2020-07-24 LAB — COMPREHENSIVE METABOLIC PANEL
ALT: 58 U/L — ABNORMAL HIGH (ref 0–44)
AST: 20 U/L (ref 15–41)
Albumin: 3.8 g/dL (ref 3.5–5.0)
Alkaline Phosphatase: 56 U/L (ref 38–126)
Anion gap: 7 (ref 5–15)
BUN: 11 mg/dL (ref 6–20)
CO2: 28 mmol/L (ref 22–32)
Calcium: 9.3 mg/dL (ref 8.9–10.3)
Chloride: 103 mmol/L (ref 98–111)
Creatinine, Ser: 0.78 mg/dL (ref 0.61–1.24)
GFR, Estimated: >60 mL/min (ref >60)
Glucose, Bld: 111 mg/dL — ABNORMAL HIGH (ref 70–99)
Potassium: 3.6 mmol/L (ref 3.5–5.1)
Sodium: 138 mmol/L (ref 135–145)
Total Bilirubin: 0.3 mg/dL (ref 0.3–1.2)
Total Protein: 5.6 g/dL — ABNORMAL LOW (ref 6.5–8.1)

## 2020-07-24 LAB — CBC WITH DIFFERENTIAL (CANCER CENTER ONLY)
Abs Immature Granulocytes: 0.04 10*3/uL (ref 0.00–0.07)
Basophils Absolute: 0 10*3/uL (ref 0.0–0.1)
Basophils Relative: 0 %
Eosinophils Absolute: 0.1 10*3/uL (ref 0.0–0.5)
Eosinophils Relative: 3 %
HCT: 30.4 % — ABNORMAL LOW (ref 39.0–52.0)
Hemoglobin: 10 g/dL — ABNORMAL LOW (ref 13.0–17.0)
Immature Granulocytes: 2 %
Lymphocytes Relative: 18 %
Lymphs Abs: 0.5 10*3/uL — ABNORMAL LOW (ref 0.7–4.0)
MCH: 29.6 pg (ref 26.0–34.0)
MCHC: 32.9 g/dL (ref 30.0–36.0)
MCV: 89.9 fL (ref 80.0–100.0)
Monocytes Absolute: 0 10*3/uL — ABNORMAL LOW (ref 0.1–1.0)
Monocytes Relative: 1 %
Neutro Abs: 2 10*3/uL (ref 1.7–7.7)
Neutrophils Relative %: 76 %
Platelet Count: 194 10*3/uL (ref 150–400)
RBC: 3.38 MIL/uL — ABNORMAL LOW (ref 4.22–5.81)
RDW: 20.8 % — ABNORMAL HIGH (ref 11.5–15.5)
WBC Count: 2.7 10*3/uL — ABNORMAL LOW (ref 4.0–10.5)
nRBC: 0 % (ref 0.0–0.2)

## 2020-07-24 LAB — LACTATE DEHYDROGENASE: LDH: 133 U/L (ref 98–192)

## 2020-07-24 NOTE — Patient Instructions (Signed)

## 2020-07-24 NOTE — Progress Notes (Signed)
Oncology Nurse Navigator Documentation  Oncology Nurse Navigator Flowsheets 07/24/2020  Abnormal Finding Date -  Confirmed Diagnosis Date -  Diagnosis Status -  Planned Course of Treatment -  Phase of Treatment -  Chemotherapy Actual Start Date: -  Navigator Follow Up Date: 07/30/2020  Navigator Follow Up Reason: Other:  Production assistant, radio Encounter Type Treatment  Telephone -  Treatment Initiated Date -  Patient Visit Type MedOnc  Treatment Phase Active Tx  Barriers/Navigation Needs Coordination of Care;Education  Education -  Interventions -  Acuity Level 3-Moderate Needs (3-4 Barriers Identified)  Coordination of Care -  Education Method -  Support Groups/Services Friends and Family  Time Spent with Patient 30

## 2020-07-28 ENCOUNTER — Encounter: Payer: Self-pay | Admitting: *Deleted

## 2020-07-29 ENCOUNTER — Other Ambulatory Visit: Payer: Self-pay | Admitting: *Deleted

## 2020-07-29 DIAGNOSIS — R1311 Dysphagia, oral phase: Secondary | ICD-10-CM

## 2020-07-29 DIAGNOSIS — C8332 Diffuse large B-cell lymphoma, intrathoracic lymph nodes: Secondary | ICD-10-CM

## 2020-07-29 MED ORDER — MAGIC MOUTHWASH: 5.0000 mL | Freq: Four times a day (QID) | ORAL | 6 refills | Status: DC | PRN

## 2020-07-30 ENCOUNTER — Encounter: Payer: Self-pay | Admitting: *Deleted

## 2020-07-30 ENCOUNTER — Other Ambulatory Visit: Payer: Self-pay | Admitting: *Deleted

## 2020-07-30 DIAGNOSIS — C8332 Diffuse large B-cell lymphoma, intrathoracic lymph nodes: Secondary | ICD-10-CM

## 2020-07-31 ENCOUNTER — Telehealth: Payer: Self-pay | Admitting: *Deleted

## 2020-07-31 ENCOUNTER — Encounter: Payer: Self-pay | Admitting: *Deleted

## 2020-07-31 ENCOUNTER — Inpatient Hospital Stay: Payer: BC Managed Care – PPO

## 2020-07-31 ENCOUNTER — Inpatient Hospital Stay (HOSPITAL_BASED_OUTPATIENT_CLINIC_OR_DEPARTMENT_OTHER): Payer: BC Managed Care – PPO | Admitting: Hematology & Oncology

## 2020-07-31 ENCOUNTER — Encounter: Payer: Self-pay | Admitting: Hematology & Oncology

## 2020-07-31 ENCOUNTER — Other Ambulatory Visit: Payer: Self-pay

## 2020-07-31 VITALS — BP 108/80 | HR 98 | Temp 97.9°F | Resp 18 | Ht 69.0 in | Wt 187.1 lb

## 2020-07-31 DIAGNOSIS — C8332 Diffuse large B-cell lymphoma, intrathoracic lymph nodes: Secondary | ICD-10-CM

## 2020-07-31 DIAGNOSIS — C8520 Mediastinal (thymic) large B-cell lymphoma, unspecified site: Secondary | ICD-10-CM | POA: Diagnosis not present

## 2020-07-31 LAB — CBC WITH DIFFERENTIAL (CANCER CENTER ONLY)
Abs Immature Granulocytes: 0 10*3/uL (ref 0.00–0.07)
Basophils Absolute: 0 10*3/uL (ref 0.0–0.1)
Basophils Relative: 0 %
Eosinophils Absolute: 0 10*3/uL (ref 0.0–0.5)
Eosinophils Relative: 2 %
HCT: 31.2 % — ABNORMAL LOW (ref 39.0–52.0)
Hemoglobin: 10.1 g/dL — ABNORMAL LOW (ref 13.0–17.0)
Lymphocytes Relative: 80 %
Lymphs Abs: 0.8 10*3/uL (ref 0.7–4.0)
MCH: 30.1 pg (ref 26.0–34.0)
MCHC: 32.4 g/dL (ref 30.0–36.0)
MCV: 93.1 fL (ref 80.0–100.0)
Monocytes Absolute: 0.1 10*3/uL (ref 0.1–1.0)
Monocytes Relative: 12 %
Neutro Abs: 0.1 10*3/uL — CL (ref 1.7–7.7)
Neutrophils Relative %: 6 %
Platelet Count: 312 10*3/uL (ref 150–400)
RBC: 3.35 MIL/uL — ABNORMAL LOW (ref 4.22–5.81)
RDW: 21 % — ABNORMAL HIGH (ref 11.5–15.5)
WBC Count: 1 10*3/uL — ABNORMAL LOW (ref 4.0–10.5)
nRBC: 0 % (ref 0.0–0.2)

## 2020-07-31 LAB — CMP (CANCER CENTER ONLY)
ALT: 31 U/L (ref 0–44)
AST: 15 U/L (ref 15–41)
Albumin: 3.9 g/dL (ref 3.5–5.0)
Alkaline Phosphatase: 82 U/L (ref 38–126)
Anion gap: 8 (ref 5–15)
BUN: 12 mg/dL (ref 6–20)
CO2: 25 mmol/L (ref 22–32)
Calcium: 9.6 mg/dL (ref 8.9–10.3)
Chloride: 107 mmol/L (ref 98–111)
Creatinine: 1.04 mg/dL (ref 0.61–1.24)
GFR, Estimated: >60 mL/min (ref >60)
Glucose, Bld: 100 mg/dL — ABNORMAL HIGH (ref 70–99)
Potassium: 4 mmol/L (ref 3.5–5.1)
Sodium: 140 mmol/L (ref 135–145)
Total Bilirubin: 0.2 mg/dL — ABNORMAL LOW (ref 0.3–1.2)
Total Protein: 6.1 g/dL — ABNORMAL LOW (ref 6.5–8.1)

## 2020-07-31 NOTE — Progress Notes (Signed)
Hematology and Oncology Follow Up Visit  Travis Duran [id number] 06/02/1976 44 y.o. 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  He is doing quite well.  He did not have any Neulasta after this cycle of treatment because of the possibility of a pneumonitis from the last bioequivalent that he had.  His ANC is 0.1.  However, his monocytes are up a little bit so I think that he will be okay for going into the hospital on Monday for treatment.  We did do a PET scan on him.  This was done on 07/22/2020.  This showed a fantastic response.  There was however as reported by the radiologist a area that appeared to be have a little bit of activity which was Deauville 3.  I realize this is somewhat of a gray area.  Some consider this to be a response while others considered to be active disease.  The fact that he has had a fantastic response elsewhere with no evidence of activity in other areas I would think indicates a good response.  He did have a some issues with swallowing.  This hopefully will improve.  He has not had any issues with fever.  He has had no rashes.  He has had no problems with bowels or bladder.  He is eating well.  He had a nice Christmas.  He has been quite active.  Overall, I would say that his performance status right now is ECOG 0.    Medications:  Current Outpatient Medications:  .  apixaban (ELIQUIS) 5 MG TABS tablet, Take 5 mg by mouth 2 (two) times daily., Disp: , Rfl:  .  chlorhexidine (PERIDEX) 0.12 % solution, Use as directed 15 mLs in the mouth or throat 2 (two) times daily as needed (twice daily during chemo)., Disp: , Rfl:  .  ciprofloxacin (CIPRO) 500 MG tablet, Take 1 tablet (500 mg total) by mouth daily with breakfast. Resume after finishing Levaquin course., Disp: 30 tablet, Rfl: 5 .  famciclovir (FAMVIR) 500  MG tablet, Take 1 tablet (500 mg total) by mouth daily., Disp: 30 tablet, Rfl: 5 .  fluconazole (DIFLUCAN) 100 MG tablet, Take 1 tablet (100 mg total) by mouth daily., Disp: 14 tablet, Rfl: 5 .  magic mouthwash SOLN, Take 5 mLs by mouth 4 (four) times daily as needed for mouth pain., Disp: 480 mL, Rfl: 6 .  Melatonin 3 MG CAPS, Take 3 mg by mouth at bedtime., Disp: , Rfl:  .  ondansetron (ZOFRAN) 4 MG tablet, Take 1-2 tablets (4-8 mg total) by mouth every 8 (eight) hours as needed for nausea (not responsive to prochlorperazine (COMPAZINE))., Disp: 20 tablet, Rfl: 0 .  prochlorperazine (COMPAZINE) 10 MG tablet, Take 1 tablet (10 mg total) by mouth every 6 (six) hours as needed for nausea or vomiting., Disp: 30 tablet, Rfl: 0 .  Sodium Chloride-Sodium Bicarb (SODIUM BICARBONATE/SODIUM CHLORIDE) SOLN, 1 application by Mouth Rinse route every 4 (four) hours., Disp: , Rfl:  .  tadalafil (CIALIS) 10 MG tablet, Take 1 tablet (10 mg total) by mouth daily as needed for erectile dysfunction., Disp: 10 tablet, Rfl: 11  Allergies: No Known Allergies  Past Medical History, Surgical history, Social history, and Family History were reviewed and updated.  Review of Systems: Review of Systems  Constitutional: Negative for unexpected weight  change.  HENT:   Negative for mouth sores.   Eyes: Negative.   Respiratory: Positive for chest tightness, cough and shortness of breath.   Cardiovascular: Negative.   Gastrointestinal: Negative.   Endocrine: Negative.   Genitourinary: Negative.    Musculoskeletal: Negative.   Skin: Negative.   Neurological: Negative.   Hematological: Negative.   Psychiatric/Behavioral: Negative.     Physical Exam:  height is 5\' 9"  (1.753 m) and weight is 187 lb 1.6 oz (84.9 kg). His oral temperature is 97.9 F (36.6 C). His blood pressure is 108/80 and his pulse is 98. His respiration is 18 and oxygen saturation is 100%.   Wt Readings from Last 3 Encounters:  07/31/20 187 lb 1.6  oz (84.9 kg)  07/14/20 190 lb (86.2 kg)  07/05/20 183 lb 12.8 oz (83.4 kg)    Physical Exam Vitals reviewed.  HENT:     Head: Normocephalic and atraumatic.  Eyes:     Pupils: Pupils are equal, round, and reactive to light.  Cardiovascular:     Rate and Rhythm: Regular rhythm. Tachycardia present.     Heart sounds: Normal heart sounds.     Comments: Cardiac exam is tachycardic but regular.  He has no murmurs rubs or bruits. Pulmonary:     Comments: Pulmonary exam shows wheezing bilaterally.  He has crackles bilaterally.  He has decent air movement.   Abdominal:     General: Bowel sounds are normal.     Palpations: Abdomen is soft.  Musculoskeletal:        General: No tenderness or deformity. Normal range of motion.     Cervical back: Normal range of motion.  Lymphadenopathy:     Cervical: No cervical adenopathy.  Skin:    General: Skin is warm and dry.     Findings: No erythema or rash.  Neurological:     Mental Status: He is alert and oriented to person, place, and time.  Psychiatric:        Behavior: Behavior normal.        Thought Content: Thought content normal.        Judgment: Judgment normal.    Lab Results  Component Value Date   WBC 1.0 (L) 07/31/2020   HGB 10.1 (L) 07/31/2020   HCT 31.2 (L) 07/31/2020   MCV 93.1 07/31/2020   PLT 312 07/31/2020     Chemistry      Component Value Date/Time   NA 140 07/31/2020 1103   K 4.0 07/31/2020 1103   CL 107 07/31/2020 1103   CO2 25 07/31/2020 1103   BUN 12 07/31/2020 1103   CREATININE 1.04 07/31/2020 1103      Component Value Date/Time   CALCIUM 9.6 07/31/2020 1103   ALKPHOS 82 07/31/2020 1103   AST 15 07/31/2020 1103   ALT 31 07/31/2020 1103   BILITOT 0.2 (L) 07/31/2020 1103      Impression and Plan: Travis Duran is a really nice 44 year old white male.  He presented with a massive mediastinal tumor.  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   This is a B-cell lymphoma.  I think that we will go ahead and try to get him admitted on Monday.  Unfortunately, he will need to have a Covid test that day.  We will figure out how to try to order this.  This will be his fifth cycle of chemotherapy.  I will plan for a total  of 6 cycles of treatment and then we will see about involved field radiation.  I am just happy that he is done so well.  I know he has had a very busy year.   Volanda Napoleon, MD 12/30/20212:12 PM

## 2020-07-31 NOTE — Telephone Encounter (Signed)
Dr. Ennever notified of ANC-0.1.  No new orders received at this time.  

## 2020-07-31 NOTE — Progress Notes (Signed)
Patient to be admitted on 08/04/20 for cycle 5 of chemo. Bed placement called and admission scheduled. Inpatient Chemotherapy team notified of admission. Patient is aware of procedure - MyChart message sent with directions.  Due to holiday, patient will be unable to get covid test completed within 3 days of admission. He will be tested upon admission per the inpatient unit AD, Charlynne Pander. Patient is aware of this procedure variation.   Oncology Nurse Navigator Documentation  Oncology Nurse Navigator Flowsheets 07/31/2020  Abnormal Finding Date -  Confirmed Diagnosis Date -  Diagnosis Status -  Planned Course of Treatment -  Phase of Treatment -  Chemotherapy Actual Start Date: -  Navigator Follow Up Date: 08/04/2020  Navigator Follow Up Reason: Other:  Financial risk analyst Encounter Type Treatment;Follow-up Appt;MyChart  Telephone Outgoing Call  Treatment Initiated Date -  Patient Visit Type MedOnc  Treatment Phase Active Tx  Barriers/Navigation Needs Coordination of Care;Education  Education Other  Interventions Coordination of Care;Education;Psycho-Social Support  Acuity Level 3-Moderate Needs (3-4 Barriers Identified)  Coordination of Care Other  Education Method Verbal;Written  Support Groups/Services Friends and Family  Time Spent with Patient 60

## 2020-08-02 ENCOUNTER — Other Ambulatory Visit: Payer: Self-pay | Admitting: Hematology & Oncology

## 2020-08-02 DIAGNOSIS — C8332 Diffuse large B-cell lymphoma, intrathoracic lymph nodes: Secondary | ICD-10-CM

## 2020-08-04 ENCOUNTER — Encounter: Payer: Self-pay | Admitting: *Deleted

## 2020-08-04 ENCOUNTER — Encounter (HOSPITAL_COMMUNITY): Payer: Self-pay | Admitting: Hematology & Oncology

## 2020-08-04 ENCOUNTER — Other Ambulatory Visit: Payer: Self-pay

## 2020-08-04 ENCOUNTER — Inpatient Hospital Stay (HOSPITAL_COMMUNITY)
Admission: AD | Admit: 2020-08-04 | Discharge: 2020-08-08 | DRG: 847 | Disposition: A | Discharge status: Home / Self Care | Payer: BC Managed Care – PPO | Attending: Hematology & Oncology | Admitting: Hematology & Oncology

## 2020-08-04 DIAGNOSIS — J90 Pleural effusion, not elsewhere classified: Secondary | ICD-10-CM | POA: Diagnosis present

## 2020-08-04 DIAGNOSIS — Z7901 Long term (current) use of anticoagulants: Secondary | ICD-10-CM

## 2020-08-04 DIAGNOSIS — Z20822 Contact with and (suspected) exposure to covid-19: Secondary | ICD-10-CM | POA: Diagnosis present

## 2020-08-04 DIAGNOSIS — Z5111 Encounter for antineoplastic chemotherapy: Principal | ICD-10-CM

## 2020-08-04 DIAGNOSIS — Y92239 Unspecified place in hospital as the place of occurrence of the external cause: Secondary | ICD-10-CM | POA: Diagnosis not present

## 2020-08-04 DIAGNOSIS — Z8249 Family history of ischemic heart disease and other diseases of the circulatory system: Secondary | ICD-10-CM | POA: Diagnosis not present

## 2020-08-04 DIAGNOSIS — C8582 Other specified types of non-Hodgkin lymphoma, intrathoracic lymph nodes: Principal | ICD-10-CM | POA: Diagnosis present

## 2020-08-04 DIAGNOSIS — C8330 Diffuse large B-cell lymphoma, unspecified site: Secondary | ICD-10-CM | POA: Diagnosis present

## 2020-08-04 DIAGNOSIS — R066 Hiccough: Secondary | ICD-10-CM | POA: Diagnosis not present

## 2020-08-04 DIAGNOSIS — C8529 Mediastinal (thymic) large B-cell lymphoma, extranodal and solid organ sites: Secondary | ICD-10-CM | POA: Diagnosis not present

## 2020-08-04 DIAGNOSIS — D630 Anemia in neoplastic disease: Secondary | ICD-10-CM | POA: Diagnosis present

## 2020-08-04 DIAGNOSIS — Z86711 Personal history of pulmonary embolism: Secondary | ICD-10-CM | POA: Diagnosis not present

## 2020-08-04 DIAGNOSIS — J910 Malignant pleural effusion: Secondary | ICD-10-CM | POA: Diagnosis not present

## 2020-08-04 DIAGNOSIS — Z825 Family history of asthma and other chronic lower respiratory diseases: Secondary | ICD-10-CM

## 2020-08-04 DIAGNOSIS — G4733 Obstructive sleep apnea (adult) (pediatric): Secondary | ICD-10-CM | POA: Diagnosis present

## 2020-08-04 DIAGNOSIS — R059 Cough, unspecified: Secondary | ICD-10-CM | POA: Diagnosis present

## 2020-08-04 DIAGNOSIS — T380X5A Adverse effect of glucocorticoids and synthetic analogues, initial encounter: Secondary | ICD-10-CM | POA: Diagnosis not present

## 2020-08-04 DIAGNOSIS — I2699 Other pulmonary embolism without acute cor pulmonale: Secondary | ICD-10-CM | POA: Diagnosis not present

## 2020-08-04 DIAGNOSIS — C8332 Diffuse large B-cell lymphoma, intrathoracic lymph nodes: Secondary | ICD-10-CM

## 2020-08-04 LAB — CBC WITH DIFFERENTIAL/PLATELET
Abs Immature Granulocytes: 0.2 10*3/uL — ABNORMAL HIGH (ref 0.00–0.07)
Band Neutrophils: 4 %
Basophils Absolute: 0 10*3/uL (ref 0.0–0.1)
Basophils Relative: 0 %
Eosinophils Absolute: 0 10*3/uL (ref 0.0–0.5)
Eosinophils Relative: 1 %
HCT: 29.7 % — ABNORMAL LOW (ref 39.0–52.0)
Hemoglobin: 9.5 g/dL — ABNORMAL LOW (ref 13.0–17.0)
Lymphocytes Relative: 31 %
Lymphs Abs: 1.1 10*3/uL (ref 0.7–4.0)
MCH: 29.8 pg (ref 26.0–34.0)
MCHC: 32 g/dL (ref 30.0–36.0)
MCV: 93.1 fL (ref 80.0–100.0)
Monocytes Absolute: 0.4 10*3/uL (ref 0.1–1.0)
Monocytes Relative: 11 %
Myelocytes: 6 %
Neutro Abs: 1.8 10*3/uL (ref 1.7–7.7)
Neutrophils Relative %: 47 %
Platelets: 423 10*3/uL — ABNORMAL HIGH (ref 150–400)
RBC: 3.19 MIL/uL — ABNORMAL LOW (ref 4.22–5.81)
RDW: 20.5 % — ABNORMAL HIGH (ref 11.5–15.5)
WBC: 3.5 10*3/uL — ABNORMAL LOW (ref 4.0–10.5)
nRBC: 0 % (ref 0.0–0.2)

## 2020-08-04 LAB — COMPREHENSIVE METABOLIC PANEL
ALT: 21 U/L (ref 0–44)
AST: 17 U/L (ref 15–41)
Albumin: 3.5 g/dL (ref 3.5–5.0)
Alkaline Phosphatase: 79 U/L (ref 38–126)
Anion gap: 9 (ref 5–15)
BUN: 11 mg/dL (ref 6–20)
CO2: 24 mmol/L (ref 22–32)
Calcium: 8.7 mg/dL — ABNORMAL LOW (ref 8.9–10.3)
Chloride: 107 mmol/L (ref 98–111)
Creatinine, Ser: 1.05 mg/dL (ref 0.61–1.24)
GFR, Estimated: >60 mL/min (ref >60)
Glucose, Bld: 108 mg/dL — ABNORMAL HIGH (ref 70–99)
Potassium: 3.6 mmol/L (ref 3.5–5.1)
Sodium: 140 mmol/L (ref 135–145)
Total Bilirubin: 0.4 mg/dL (ref 0.3–1.2)
Total Protein: 5.7 g/dL — ABNORMAL LOW (ref 6.5–8.1)

## 2020-08-04 LAB — URINALYSIS, ROUTINE W REFLEX MICROSCOPIC
Bilirubin Urine: NEGATIVE
Glucose, UA: NEGATIVE mg/dL
Hgb urine dipstick: NEGATIVE
Ketones, ur: NEGATIVE mg/dL
Leukocytes,Ua: NEGATIVE
Nitrite: NEGATIVE
Protein, ur: NEGATIVE mg/dL
Specific Gravity, Urine: 1.015 (ref 1.005–1.030)
pH: 7 (ref 5.0–8.0)

## 2020-08-04 LAB — MAGNESIUM: Magnesium: 2.1 mg/dL (ref 1.7–2.4)

## 2020-08-04 LAB — SARS CORONAVIRUS 2 (TAT 6-24 HRS): SARS Coronavirus 2: NEGATIVE

## 2020-08-04 LAB — PHOSPHORUS: Phosphorus: 3.4 mg/dL (ref 2.5–4.6)

## 2020-08-04 MED ORDER — POTASSIUM CHLORIDE IN NACL 20-0.9 MEQ/L-% IV SOLN
INTRAVENOUS | Status: DC
  Filled 2020-08-04 (×5): qty 1000

## 2020-08-04 MED ORDER — SODIUM CHLORIDE 0.9 % IV SOLN
Freq: Once | INTRAVENOUS | Status: AC
  Administered 2020-08-04: 18 mg via INTRAVENOUS
  Filled 2020-08-04: qty 4

## 2020-08-04 MED ORDER — APIXABAN 5 MG PO TABS
5.0000 mg | ORAL_TABLET | Freq: Two times a day (BID) | ORAL | Status: DC
  Administered 2020-08-04 – 2020-08-08 (×8): 5 mg via ORAL
  Filled 2020-08-04 (×9): qty 1

## 2020-08-04 MED ORDER — PREDNISONE 20 MG PO TABS
60.0000 mg | ORAL_TABLET | Freq: Every day | ORAL | Status: AC
  Administered 2020-08-04 – 2020-08-08 (×5): 60 mg via ORAL
  Filled 2020-08-04 (×5): qty 3

## 2020-08-04 MED ORDER — ONDANSETRON HCL 4 MG PO TABS: 4.0000 mg | ORAL_TABLET | Freq: Three times a day (TID) | ORAL | Status: DC | PRN

## 2020-08-04 MED ORDER — VINCRISTINE SULFATE CHEMO INJECTION 1 MG/ML
Freq: Once | INTRAVENOUS | Status: AC
  Filled 2020-08-04: qty 11

## 2020-08-04 MED ORDER — FLUCONAZOLE 100 MG PO TABS
100.0000 mg | ORAL_TABLET | Freq: Every day | ORAL | Status: DC
  Administered 2020-08-05 – 2020-08-07 (×3): 100 mg via ORAL
  Filled 2020-08-04 (×5): qty 1

## 2020-08-04 MED ORDER — FAMCICLOVIR 500 MG PO TABS
500.0000 mg | ORAL_TABLET | Freq: Every day | ORAL | Status: DC
  Administered 2020-08-05 – 2020-08-08 (×4): 500 mg via ORAL
  Filled 2020-08-04 (×5): qty 1

## 2020-08-04 MED ORDER — MELATONIN 3 MG PO TABS: 3.0000 mg | ORAL_TABLET | Freq: Every evening | ORAL | Status: DC | PRN

## 2020-08-04 MED ORDER — MAGIC MOUTHWASH
5.0000 mL | Freq: Four times a day (QID) | ORAL | Status: DC | PRN
  Filled 2020-08-04: qty 5

## 2020-08-04 MED ORDER — PROCHLORPERAZINE MALEATE 10 MG PO TABS: 10.0000 mg | ORAL_TABLET | Freq: Four times a day (QID) | ORAL | Status: DC | PRN

## 2020-08-04 MED ORDER — SODIUM BICARBONATE/SODIUM CHLORIDE MOUTHWASH
1.0000 "application " | OROMUCOSAL | Status: DC
  Administered 2020-08-04 – 2020-08-08 (×20): 1 via OROMUCOSAL
  Filled 2020-08-04: qty 1000

## 2020-08-04 MED ORDER — CHLORHEXIDINE GLUCONATE 0.12 % MT SOLN: 15.0000 mL | Freq: Two times a day (BID) | OROMUCOSAL | Status: DC | PRN

## 2020-08-04 MED ORDER — CIPROFLOXACIN HCL 500 MG PO TABS
500.0000 mg | ORAL_TABLET | Freq: Every day | ORAL | Status: DC
  Administered 2020-08-05 – 2020-08-08 (×4): 500 mg via ORAL
  Filled 2020-08-04 (×4): qty 1

## 2020-08-04 NOTE — Progress Notes (Signed)
Calculation and dosing of doxorubicin, etoposide and vincristine verified by another chemo certified nurse .

## 2020-08-04 NOTE — Progress Notes (Signed)
Oncology Nurse Navigator Documentation  Oncology Nurse Navigator Flowsheets 08/04/2020  Abnormal Finding Date -  Confirmed Diagnosis Date -  Diagnosis Status -  Planned Course of Treatment -  Phase of Treatment -  Chemotherapy Actual Start Date: -  Navigator Follow Up Date: 08/08/2020  Navigator Follow Up Reason: Appointment Review  Navigator Location [facility]  Navigator Encounter Type Appt/Treatment Plan Review  Telephone -  Treatment Initiated Date -  Patient Visit Type MedOnc  Treatment Phase Active Tx  Barriers/Navigation Needs Coordination of Care;Education  Education -  Interventions None Required  Acuity Level 3-Moderate Needs (3-4 Barriers Identified)  Coordination of Care -  Education Method -  Support Groups/Services Friends and Family  Time Spent with Patient 15

## 2020-08-04 NOTE — H&P (Addendum)
[place]  [phone] Fax:([phone]) [place]  Reason for Admission: Cycle #5 EPOCH-R for primary mediastinal B-cell lymphoma  HPI: Mr. Hanneman is a 45 year old male who was seen for initial consult during his hospitalization on 04/25/2020.  The patient had progressive shortness of breath over a 6-week period of time and a CT of the chest performed on 04/24/2020 showed a large malignant appearing left anterior mediastinal mass measuring 14.6 x 11.7 x 18.5 cm, large left pleural effusion, and small pericardial effusion.  He underwent several thoracenteses during his hospitalization as well as a CT-guided biopsy of the mediastinal mass.  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  Echocardiogram performed on 04/27/2020 showed a LVEF of 60 to 65%.  We were unable to perform a PET scan prior to his first dose of chemotherapy, but he had a PET scan performed on 05/15/2020 following cycle #1.  This showed a considerable reduction in size of the anterior mediastinal mass in the left pleural tumor deposits.  There was splenic activity and marrow activity likely due to G-CSF.  The patient required hospital admission following cycle #3 of his chemotherapy due to hypoxia.  He was found to have pulmonary emboli and airspace opacities throughout the lungs with consolidation in the lung bases concerning for atypical pneumonia versus hypersensitivity/chemotherapy associated pneumonitis.  Bronchoscopy was performed and he was placed on Levaquin and prednisone.  Pulmonology felt pneumonitis related to G-CSF.  G-CSF was held with cycle #4 of his chemotherapy. He did develop neutropenia but remained afebrile and had no signs of infection.  The patient presents to the hospital today for admission for cycle #5 of chemotherapy.  He is  feeling well overall.  He has not had any recent fevers or chills.  Denies chest pain, shortness of breath, cough. Denies abdominal pain, nausea, vomiting, constipation, diarrhea.  No bleeding reported.  He reports a good energy level and has been able to walk up to 3 miles. The patient is seen today for admission for cycle #5 of EPOCH-R.   Past Medical History:  Diagnosis Date  . Arthritis of right knee   . ED (erectile dysfunction)   . Lymphoma, large cell, intrathoracic lymph nodes ([place]) 05/01/2020  . OSA (obstructive sleep apnea) 2007   with AHI 32/hr  . Pulmonary embolism ([place]) 07/02/2020   Past Surgical History:  Procedure Laterality Date  . BRONCHIAL WASHINGS  07/03/2020   Procedure: BRONCHIAL WASHINGS;  Surgeon: Spero Geralds, MD;  Location: [facility] ENDOSCOPY;  Service: Pulmonary;;  . burn to left hand     skin graphs  . FINE NEEDLE ASPIRATION  04/25/2020   Procedure: FINE NEEDLE ASPIRATION (FNA) LINEAR;  Surgeon: Candee Furbish, MD;  Location: [facility] ENDOSCOPY;  Service: Pulmonary;;  . IR IMAGING GUIDED PORT INSERTION  05/07/2020  . IR THORACENTESIS ASP PLEURAL SPACE W/IMG GUIDE  04/25/2020  . IR THORACENTESIS ASP PLEURAL SPACE W/IMG GUIDE  05/07/2020  . REFRACTIVE SURGERY  09/2012   bilateral   . THORACENTESIS  04/25/2020   Procedure: THORACENTESIS;  Surgeon: Candee Furbish, MD;  Location: [facility] ENDOSCOPY;  Service: Pulmonary;;  . VIDEO BRONCHOSCOPY N/A 07/03/2020   Procedure: VIDEO BRONCHOSCOPY WITHOUT FLUORO;  Surgeon: Spero Geralds, MD;  Location: [facility] ENDOSCOPY;  Service: Pulmonary;  Laterality: N/A;  . VIDEO BRONCHOSCOPY WITH ENDOBRONCHIAL ULTRASOUND N/A 04/25/2020   Procedure: VIDEO  BRONCHOSCOPY WITH ENDOBRONCHIAL ULTRASOUND;  Surgeon: Candee Furbish, MD;  Location: [facility] ENDOSCOPY;  Service: Pulmonary;  Laterality: N/A;        Family History  Problem Relation Age of Onset  . Sleep apnea Other   . Hypertension Other   . Emphysema Father   . Hypertension Father   Relation Age of  Onset  . Sleep apnea Other   . Hypertension Other   . Emphysema Father   . Hypertension Father      Socioeconomic History  . Marital status: Married    Spouse name: Not on file  . Number of children: 0  . Years of education: Not on file  . Highest education level: Not on file  Occupational History  . Not on file  Tobacco Use  . Smoking status: Never Smoker  . Smokeless tobacco: Never Used  Vaping Use  . Vaping Use: Never used  Substance and Sexual Activity  . Alcohol use: Yes    Alcohol/week: 0.0 standard drinks    Comment: occ  . Drug use: No  . Sexual activity: Yes    Partners: Female  Other Topics Concern  . Not on file  Social History Narrative  . Not on file   Social Determinants of Health   Financial Resource Strin:   . Difficulty of Paying Living Expenses: Not on file  Food Insecurity:   . Worried About Charity fundraiser in the Last Year: Not on file  . Ran Out of Food in the Last Year: Not on file  Transportation Needs:   . Lack of Transportation (Medical): Not on file  . Lack of Transportation (Non-Medical): Not on file  Physical Activity:   . Days of Exercise per Week: Not on file  . Minutes of Exercise per Session: Not on file  Stress:   . Feeling of Stress : Not on file  Social Connections:   . Frequency of Communication with Friends and Family: Not on file  . Frequency of Social Gatherings with Friends and Family: Not on file  . Attends Religious Services: Not on file  . Active Member of Clubs or Organizations: Not on file  . Attends Archivist Meetings: Not on file  . Marital Status: Not on file  Intimate Partner Violence:   . Fear of Current or Ex-Partner: Not on file  . Emotionally Abused: Not on file  . Physically Abused: Not on file  . Sexually Abused: Not on file  :  Review of Systems: A comprehensive 14 point review of systems was negative except as noted in the HPI.  Exam: BP 111/73 (BP Location: Left Arm)   Pulse 85    Temp 98 F (36.7 C) (Oral)   Resp 16   Ht 5\' 9"  (1.753 m)   Wt 86.3 kg   SpO2 99%   BMI 28.09 kg/m   General:  well-nourished in no acute distress.   Eyes:  no scleral icterus.   ENT:  There were no oropharyngeal lesions.   Neck was without thyromegaly.   Lymphatics:  Negative cervical, supraclavicular or axillary adenopathy.   Respiratory: Clear to auscultation bilaterally Cardiovascular:  Regular rate and rhythm, S1/S2, without murmur, rub or gallop.  There was no pedal edema.   GI:  abdomen was soft, flat, nontender, nondistended, without organomegaly.   Musculoskeletal: Strength symmetrical in the upper and lower extremities. Skin exam was without echymosis, petichae.   Neuro exam was nonfocal. Patient was alert and oriented.  Attention was  good.   Language was appropriate.  Mood was normal without depression.  Speech was not pressured.  Thought content was not tangential.    CBC    Component Value Date/Time   WBC 3.5 (L) 08/04/2020 0940   RBC 3.19 (L) 08/04/2020 0940   HGB 9.5 (L) 08/04/2020 0940   HGB 10.1 (L) 07/31/2020 1103   HCT 29.7 (L) 08/04/2020 0940   PLT 423 (H) 08/04/2020 0940   PLT 312 07/31/2020 1103   MCV 93.1 08/04/2020 0940   MCH 29.8 08/04/2020 0940   MCHC 32.0 08/04/2020 0940   RDW 20.5 (H) 08/04/2020 0940   LYMPHSABS PENDING 08/04/2020 0940   MONOABS PENDING 08/04/2020 0940   EOSABS PENDING 08/04/2020 0940   BASOSABS PENDING 08/04/2020 0940   CMP Latest Ref Rng & Units 07/31/2020 07/24/2020 07/18/2020  Glucose 70 - 99 mg/dL 412(I) 786(V) 672(C)  BUN 6 - 20 mg/dL 12 11 19   Creatinine 0.61 - 1.24 mg/dL 9.47 0.96  Sodium 135 - 145 mmol/L 140 138 137  Potassium 3.5 - 5.1 mmol/L 4.0 3.6 3.3(L)  Chloride 98 - 111 mmol/L 107 103 104  CO2 22 - 32 mmol/L 25 28 24   Calcium 8.9 - 10.3 mg/dL 9.6 9.3 8.9  Total Protein 6.5 - 8.1 g/dL 6.1(L) 5.6(L) 5.1(L)  Total Bilirubin 0.3 - 1.2 mg/dL 2.83) 0.3 0.7  Alkaline Phos 38 - 126 U/L 82 56 55  AST 15 - 41  U/L 15 20 17   ALT 0 - 44 U/L 31 58(H) 48(H)    Assessment and Plan:  1.  Primary mediastinal B-cell lymphoma 2.  Pleural effusion secondary to #1, improved 3.  Cough due to #1 and #2, improved  -Admit to inpatient oncology unit.  Obtain labs today including a CBC with differential and CMET. Plan to proceed with cycle #5 of systemic chemotherapy as planned once labs have been reviewed.  He is aware of the possible adverse effects and is agreeable to proceed.   -Regular diet. -He will continue home dose of Eliquis. -Continue home medications. -The patient has antiemetics available to him.  6.6(Q, DNP, AGPCNP-BC, AOCNP  ADDENDUM: I agree with the above note by .  His white cell count is okay for chemotherapy.  Were going to try him on tradename Neulasta after this cycle.  I suspect that he will need radiation therapy once we have done with chemotherapy because of the bulky disease and the slight PET positivity noted on the last scan.  We will continue the Eliquis.  Hopefully, everything will go smoothly and we will be able to get him home on Thursday or Friday.  Belenda Cruise, MD

## 2020-08-05 DIAGNOSIS — R059 Cough, unspecified: Secondary | ICD-10-CM | POA: Diagnosis not present

## 2020-08-05 DIAGNOSIS — J910 Malignant pleural effusion: Secondary | ICD-10-CM | POA: Diagnosis not present

## 2020-08-05 DIAGNOSIS — C8529 Mediastinal (thymic) large B-cell lymphoma, extranodal and solid organ sites: Secondary | ICD-10-CM | POA: Diagnosis not present

## 2020-08-05 LAB — FUNGUS CULTURE WITH STAIN

## 2020-08-05 LAB — FUNGAL ORGANISM REFLEX

## 2020-08-05 LAB — FUNGUS CULTURE RESULT

## 2020-08-05 MED ORDER — CHLORHEXIDINE GLUCONATE CLOTH 2 % EX PADS
6.0000 | MEDICATED_PAD | Freq: Every day | CUTANEOUS | Status: DC
  Administered 2020-08-05 – 2020-08-08 (×4): 6 via TOPICAL

## 2020-08-05 MED ORDER — VINCRISTINE SULFATE CHEMO INJECTION 1 MG/ML
Freq: Once | INTRAVENOUS | Status: AC
  Filled 2020-08-05: qty 11

## 2020-08-05 MED ORDER — SODIUM CHLORIDE 0.9 % IV SOLN
Freq: Once | INTRAVENOUS | Status: AC
  Administered 2020-08-05: 8 mg via INTRAVENOUS
  Filled 2020-08-05: qty 4

## 2020-08-05 MED ORDER — SODIUM CHLORIDE 0.9% FLUSH
10.0000 mL | Freq: Two times a day (BID) | INTRAVENOUS | Status: DC
  Administered 2020-08-05: 10 mL
  Administered 2020-08-05: 20 mL
  Administered 2020-08-06 – 2020-08-07 (×3): 10 mL
  Administered 2020-08-08: 20 mL

## 2020-08-05 MED ORDER — SODIUM CHLORIDE 0.9% FLUSH: 10.0000 mL | INTRAVENOUS | Status: DC | PRN

## 2020-08-05 NOTE — Progress Notes (Signed)
Everything so far is going well with Travis Duran.  He started chemotherapy a little bit late yesterday.  This was my own fault for taking a while to get the orders approved.  He had no problems sleeping last night.  He is eating well.  He is having no problems with getting out of bed.  He is on Eliquis and doing well with the Eliquis for his thromboembolic disease.  He has had no mouth sores.  He is using mouth rinses.  There is no cough or shortness of breath.  He has had no rashes.  There is been no leg swelling.  His vital signs all look stable.  Temperature 97.8.  Pulse 84.  Blood pressure 108/69.  Head and neck exam shows no ocular or oral lesions.  He has no thrush or mucositis.  He has no adenopathy in the neck.  Lungs are clear bilaterally.  Cardiac exam regular rate and rhythm.  Abdomen is soft.  Bowel sounds are present.  There is no fluid wave.  There is no palpable liver or spleen tip.  We will proceed with day #2 of treatment today.  I will check his labs tomorrow.  I appreciate the outstanding care that he is getting from the staff of [facility] [address] [facility].  I appreciate all their excellent help.  Christin Bach, MD  Psalm 41:1

## 2020-08-06 DIAGNOSIS — R059 Cough, unspecified: Secondary | ICD-10-CM | POA: Diagnosis not present

## 2020-08-06 DIAGNOSIS — C8529 Mediastinal (thymic) large B-cell lymphoma, extranodal and solid organ sites: Secondary | ICD-10-CM | POA: Diagnosis not present

## 2020-08-06 DIAGNOSIS — J910 Malignant pleural effusion: Secondary | ICD-10-CM | POA: Diagnosis not present

## 2020-08-06 LAB — COMPREHENSIVE METABOLIC PANEL
ALT: 20 U/L (ref 0–44)
AST: 17 U/L (ref 15–41)
Albumin: 3.4 g/dL — ABNORMAL LOW (ref 3.5–5.0)
Alkaline Phosphatase: 74 U/L (ref 38–126)
Anion gap: 10 (ref 5–15)
BUN: 15 mg/dL (ref 6–20)
CO2: 21 mmol/L — ABNORMAL LOW (ref 22–32)
Calcium: 9 mg/dL (ref 8.9–10.3)
Chloride: 109 mmol/L (ref 98–111)
Creatinine, Ser: 0.87 mg/dL (ref 0.61–1.24)
GFR, Estimated: >60 mL/min (ref >60)
Glucose, Bld: 130 mg/dL — ABNORMAL HIGH (ref 70–99)
Potassium: 3.5 mmol/L (ref 3.5–5.1)
Sodium: 140 mmol/L (ref 135–145)
Total Bilirubin: 0.4 mg/dL (ref 0.3–1.2)
Total Protein: 5.8 g/dL — ABNORMAL LOW (ref 6.5–8.1)

## 2020-08-06 LAB — CBC WITH DIFFERENTIAL/PLATELET
Abs Immature Granulocytes: 0.63 10*3/uL — ABNORMAL HIGH (ref 0.00–0.07)
Basophils Absolute: 0.1 10*3/uL (ref 0.0–0.1)
Basophils Relative: 0 %
Eosinophils Absolute: 0 10*3/uL (ref 0.0–0.5)
Eosinophils Relative: 0 %
HCT: 31.1 % — ABNORMAL LOW (ref 39.0–52.0)
Hemoglobin: 10.1 g/dL — ABNORMAL LOW (ref 13.0–17.0)
Immature Granulocytes: 4 %
Lymphocytes Relative: 3 %
Lymphs Abs: 0.5 10*3/uL — ABNORMAL LOW (ref 0.7–4.0)
MCH: 30.2 pg (ref 26.0–34.0)
MCHC: 32.5 g/dL (ref 30.0–36.0)
MCV: 93.1 fL (ref 80.0–100.0)
Monocytes Absolute: 1.3 10*3/uL — ABNORMAL HIGH (ref 0.1–1.0)
Monocytes Relative: 8 %
Neutro Abs: 15.3 10*3/uL — ABNORMAL HIGH (ref 1.7–7.7)
Neutrophils Relative %: 85 %
Platelets: 528 10*3/uL — ABNORMAL HIGH (ref 150–400)
RBC: 3.34 MIL/uL — ABNORMAL LOW (ref 4.22–5.81)
RDW: 20.1 % — ABNORMAL HIGH (ref 11.5–15.5)
WBC: 17.8 10*3/uL — ABNORMAL HIGH (ref 4.0–10.5)
nRBC: 0.1 % (ref 0.0–0.2)

## 2020-08-06 LAB — LACTATE DEHYDROGENASE: LDH: 195 U/L — ABNORMAL HIGH (ref 98–192)

## 2020-08-06 LAB — MAGNESIUM: Magnesium: 2 mg/dL (ref 1.7–2.4)

## 2020-08-06 MED ORDER — VINCRISTINE SULFATE CHEMO INJECTION 1 MG/ML
Freq: Once | INTRAVENOUS | Status: AC
  Filled 2020-08-06: qty 11

## 2020-08-06 MED ORDER — SODIUM CHLORIDE 0.9 % IV SOLN
Freq: Once | INTRAVENOUS | Status: AC
  Administered 2020-08-06: 18 mg via INTRAVENOUS
  Filled 2020-08-06: qty 4

## 2020-08-06 NOTE — Progress Notes (Signed)
So far, everything is going pretty well with chemotherapy.  [name] has had no problems with nausea or vomiting.  He is out of bed.  He is doing a lot of walking.  He continues on the Eliquis for his pulmonary embolism.  The chemotherapy is going nicely.  He has had no issues with rashes.  There is no problems with constipation or diarrhea.  He has had no mouth sores.  His appetite is doing quite well.  His labs that were done today show white cell count 17.8.  Hemoglobin 10.1.  Platelet count 528,000.  I am sure that the white cell elevation is from the steroids that he is taking.  His sodium is 140 potassium 3.5.  BUN 15 creatinine 0.87.  Calcium is 9 with an albumin of 3.4.  All of his vital signs look stable.  His temperature is 97.7.  Pulse 81.  Blood pressure 122/88.  His oral exam shows no mucositis.  Lungs are clear bilaterally.  Cardiac exam regular rate and rhythm.  Abdomen is soft.  He has no guarding or rebound tenderness.  There is no palpable liver or spleen tip.  Extremities shows no clubbing, cyanosis or edema.  Neurological exam is nonfocal.  [name] has a mediastinal large cell lymphoma.  He is on cycle 5 of chemotherapy.  He is done very nicely.  He has had a nice response.  I appreciate the outstanding care that he is getting from all the staff [facility] [address] [facility].  Christin Bach, MD  Psalm [phone]

## 2020-08-07 ENCOUNTER — Encounter: Payer: Self-pay | Admitting: *Deleted

## 2020-08-07 DIAGNOSIS — J910 Malignant pleural effusion: Secondary | ICD-10-CM | POA: Diagnosis not present

## 2020-08-07 DIAGNOSIS — C8529 Mediastinal (thymic) large B-cell lymphoma, extranodal and solid organ sites: Secondary | ICD-10-CM | POA: Diagnosis not present

## 2020-08-07 DIAGNOSIS — Z7901 Long term (current) use of anticoagulants: Secondary | ICD-10-CM

## 2020-08-07 DIAGNOSIS — I2699 Other pulmonary embolism without acute cor pulmonale: Secondary | ICD-10-CM

## 2020-08-07 DIAGNOSIS — R059 Cough, unspecified: Secondary | ICD-10-CM | POA: Diagnosis not present

## 2020-08-07 MED ORDER — SODIUM CHLORIDE 0.9 % IV SOLN
Freq: Once | INTRAVENOUS | Status: AC
  Administered 2020-08-07: 18 mg via INTRAVENOUS
  Filled 2020-08-07: qty 4

## 2020-08-07 MED ORDER — SODIUM CHLORIDE 0.9 % IV SOLN
750.0000 mg/m2 | Freq: Once | INTRAVENOUS | Status: AC
  Administered 2020-08-08: 1580 mg via INTRAVENOUS
  Filled 2020-08-07: qty 79

## 2020-08-07 MED ORDER — SODIUM CHLORIDE 0.9 % IV SOLN
Freq: Once | INTRAVENOUS | Status: AC
  Administered 2020-08-08: 16 mg via INTRAVENOUS
  Filled 2020-08-07: qty 8

## 2020-08-07 MED ORDER — VINCRISTINE SULFATE CHEMO INJECTION 1 MG/ML
Freq: Once | INTRAVENOUS | Status: AC
  Filled 2020-08-07: qty 11

## 2020-08-07 NOTE — Progress Notes (Signed)
Oncology Nurse Navigator Documentation  Oncology Nurse Navigator Flowsheets 08/07/2020  Abnormal Finding Date -  Confirmed Diagnosis Date -  Diagnosis Status -  Planned Course of Treatment -  Phase of Treatment -  Chemotherapy Actual Start Date: -  Navigator Follow Up Date: 08/11/2020  Navigator Follow Up Reason: Appointment Review  Navigator Location [facility]  Navigator Encounter Type Appt/Treatment Plan Review  Telephone -  Treatment Initiated Date -  Patient Visit Type MedOnc  Treatment Phase Active Tx  Barriers/Navigation Needs Coordination of Care;Education  Education -  Interventions Coordination of Care  Acuity Level 3-Moderate Needs (3-4 Barriers Identified)  Coordination of Care Appts  Education Method -  Support Groups/Services Friends and Family  Time Spent with Patient 15

## 2020-08-07 NOTE — Progress Notes (Signed)
So far, Mr. Dorer is doing well with treatment.  He has had no problems to date.  He is out of bed.  He has had no bleeding.  He is on Eliquis.  There is no nausea or vomiting.  His appetite is doing pretty well.  He has had no change in bowel or bladder habits.  There has been no diarrhea.  There has been no shortness of breath.  He has had no cough.  There has been no pain.  He has had no rashes.  Today is his fourth day of treatment with chemotherapy.  Tomorrow he gets Rituxan.  We will check labs on him tomorrow.  His vital signs are temperature 97.9.  Pulse 79.  Blood pressure 124/91.  His oral exam shows no mucositis.  Neck is supple with no adenopathy.  Lungs are clear bilaterally.  Cardiac exam regular rate and rhythm.  Abdomen is soft.  Bowel sounds are present.  There is no guarding or rebound tenderness.  Extremities shows no clubbing, cyanosis or edema.  No venous cord is noted in the legs.  Neurological exam is nonfocal.  Again, Mr. Lariccia will finish up chemotherapy tomorrow.  He will have Rituxan.  He will then go home.  We will have him come back to the office on Monday for his Neulasta.  Christin Bach, MD  1 Ramie 3:18

## 2020-08-08 ENCOUNTER — Other Ambulatory Visit: Payer: Self-pay | Admitting: Oncology

## 2020-08-08 ENCOUNTER — Other Ambulatory Visit: Payer: BC Managed Care – PPO

## 2020-08-08 ENCOUNTER — Encounter: Payer: Self-pay | Admitting: *Deleted

## 2020-08-08 ENCOUNTER — Ambulatory Visit: Payer: BC Managed Care – PPO | Admitting: Hematology & Oncology

## 2020-08-08 DIAGNOSIS — J910 Malignant pleural effusion: Secondary | ICD-10-CM | POA: Diagnosis not present

## 2020-08-08 DIAGNOSIS — R059 Cough, unspecified: Secondary | ICD-10-CM | POA: Diagnosis not present

## 2020-08-08 DIAGNOSIS — C8529 Mediastinal (thymic) large B-cell lymphoma, extranodal and solid organ sites: Secondary | ICD-10-CM | POA: Diagnosis not present

## 2020-08-08 DIAGNOSIS — I2699 Other pulmonary embolism without acute cor pulmonale: Secondary | ICD-10-CM | POA: Diagnosis not present

## 2020-08-08 LAB — CBC WITH DIFFERENTIAL/PLATELET
Abs Immature Granulocytes: 0.07 10*3/uL (ref 0.00–0.07)
Basophils Absolute: 0 10*3/uL (ref 0.0–0.1)
Basophils Relative: 0 %
Eosinophils Absolute: 0 10*3/uL (ref 0.0–0.5)
Eosinophils Relative: 0 %
HCT: 28.2 % — ABNORMAL LOW (ref 39.0–52.0)
Hemoglobin: 9.6 g/dL — ABNORMAL LOW (ref 13.0–17.0)
Immature Granulocytes: 1 %
Lymphocytes Relative: 3 %
Lymphs Abs: 0.3 10*3/uL — ABNORMAL LOW (ref 0.7–4.0)
MCH: 30.4 pg (ref 26.0–34.0)
MCHC: 34 g/dL (ref 30.0–36.0)
MCV: 89.2 fL (ref 80.0–100.0)
Monocytes Absolute: 0.2 10*3/uL (ref 0.1–1.0)
Monocytes Relative: 3 %
Neutro Abs: 8.7 10*3/uL — ABNORMAL HIGH (ref 1.7–7.7)
Neutrophils Relative %: 93 %
Platelets: 402 10*3/uL — ABNORMAL HIGH (ref 150–400)
RBC: 3.16 MIL/uL — ABNORMAL LOW (ref 4.22–5.81)
RDW: 18.6 % — ABNORMAL HIGH (ref 11.5–15.5)
WBC: 9.3 10*3/uL (ref 4.0–10.5)
nRBC: 0 % (ref 0.0–0.2)

## 2020-08-08 LAB — COMPREHENSIVE METABOLIC PANEL
ALT: 18 U/L (ref 0–44)
AST: 15 U/L (ref 15–41)
Albumin: 3 g/dL — ABNORMAL LOW (ref 3.5–5.0)
Alkaline Phosphatase: 64 U/L (ref 38–126)
Anion gap: 8 (ref 5–15)
BUN: 15 mg/dL (ref 6–20)
CO2: 26 mmol/L (ref 22–32)
Calcium: 8.8 mg/dL — ABNORMAL LOW (ref 8.9–10.3)
Chloride: 106 mmol/L (ref 98–111)
Creatinine, Ser: 0.73 mg/dL (ref 0.61–1.24)
GFR, Estimated: >60 mL/min (ref >60)
Glucose, Bld: 123 mg/dL — ABNORMAL HIGH (ref 70–99)
Potassium: 3.4 mmol/L — ABNORMAL LOW (ref 3.5–5.1)
Sodium: 140 mmol/L (ref 135–145)
Total Bilirubin: 0.4 mg/dL (ref 0.3–1.2)
Total Protein: 5.1 g/dL — ABNORMAL LOW (ref 6.5–8.1)

## 2020-08-08 LAB — LACTATE DEHYDROGENASE: LDH: 138 U/L (ref 98–192)

## 2020-08-08 MED ORDER — SODIUM CHLORIDE 0.9 % IV SOLN
375.0000 mg/m2 | Freq: Once | INTRAVENOUS | Status: AC
  Administered 2020-08-08: 800 mg via INTRAVENOUS
  Filled 2020-08-08: qty 50

## 2020-08-08 MED ORDER — APIXABAN 5 MG PO TABS: 5.0000 mg | ORAL_TABLET | Freq: Two times a day (BID) | ORAL | 1 refills | Status: DC

## 2020-08-08 MED ORDER — HEPARIN SOD (PORK) LOCK FLUSH 100 UNIT/ML IV SOLN
500.0000 [IU] | INTRAVENOUS | Status: AC | PRN
  Administered 2020-08-08: 500 [IU]
  Filled 2020-08-08: qty 5

## 2020-08-08 MED ORDER — DIPHENHYDRAMINE HCL 50 MG PO CAPS
50.0000 mg | ORAL_CAPSULE | Freq: Once | ORAL | Status: AC
  Administered 2020-08-08: 50 mg via ORAL
  Filled 2020-08-08: qty 1

## 2020-08-08 MED ORDER — ACETAMINOPHEN 325 MG PO TABS
650.0000 mg | ORAL_TABLET | Freq: Once | ORAL | Status: AC
  Administered 2020-08-08: 650 mg via ORAL
  Filled 2020-08-08: qty 2

## 2020-08-08 NOTE — Progress Notes (Signed)
Patient has new insurance and his Neulasta injection is not approved for administration on Monday. Appointment will be cancelled. Patient notified via mychart of his appointment cancellation. Dr Marin Olp will attempt an appeal to this decision from the insurance.   Patient also called to notify him of situation.  Oncology Nurse Navigator Documentation  Oncology Nurse Navigator Flowsheets 08/08/2020  Abnormal Finding Date -  Confirmed Diagnosis Date -  Diagnosis Status -  Planned Course of Treatment -  Phase of Treatment -  Chemotherapy Actual Start Date: -  Navigator Follow Up Date: 08/12/2020  Navigator Follow Up Reason: Appointment Review  Navigator Location [facility]  Navigator Encounter Type MyChart  Telephone -  Treatment Initiated Date -  Patient Visit Type MedOnc  Treatment Phase Active Tx  Barriers/Navigation Needs Coordination of Care;Education  Education Other  Interventions Coordination of Care  Acuity Level 3-Moderate Needs (3-4 Barriers Identified)  Coordination of Care Appts  Education Method Verbal;Written  Support Groups/Services Friends and Family  Time Spent with Patient 53

## 2020-08-08 NOTE — Progress Notes (Signed)
So far, chemotherapy is going quite well. He'll have his last day today. He'll get Cytoxan and Rituxan.  He has had no problems with nausea or vomiting. He does have some hiccups. I'm sure this is from the steroids that he gets. I told him that if he wanted anything we can certainly get this ordered. Baclofen tends to work pretty well for these hiccups.  He has had no cough or shortness of breath. He has had no mouth issues. There has been no diarrhea. He is up and about walking.  His labs today show white cell count 9.3. Hemoglobin 9.6. Platelet count 402,000. His sodium is 140. Potassium 3.4. BUN 15 and creatinine 0.73. Calcium 8.8 with an albumin of 3.0.  His vital signs are temperature of 97.8. Pulse 57. Blood pressure 125/93. Oral exam shows no mucositis. Lungs are clear bilaterally. He has good air movement bilaterally. Cardiac exam regular rate and rhythm. Abdomen is soft. There is no guarding or rebound tenderness. There is no fluid wave. There is no palpable liver or spleen tip. Extremities shows no clubbing, cyanosis or edema. Neurological exam is nonfocal. Skin exam shows no rashes.  Again, will plan for discharge today. He will come back in in another 3 weeks for her sixth and final cycle of treatment.  We'll come into the office on Monday for Neulasta injection. He'll get the brand name Neulasta.  Lattie Haw, MD  Colossians 3:23

## 2020-08-08 NOTE — Discharge Summary (Signed)
Discharge Summary  Patient ID: Travis Duran MRN: [id number] DOB/AGE: 1976/06/15 45 y.o.  Admit date: 08/04/2020 Discharge date: 08/08/2020  Discharge Diagnoses:  Active Problems:   Lymphoma, large cell, intrathoracic lymph nodes (HCC)   Encounter for antineoplastic chemotherapy   Diffuse large B cell lymphoma (HCC)   Discharged Condition: good  Discharge Labs:   CBC    Component Value Date/Time   WBC 9.3 08/08/2020 0544   RBC 3.16 (L) 08/08/2020 0544   HGB 9.6 (L) 08/08/2020 0544   HGB 10.1 (L) 07/31/2020 1103   HCT 28.2 (L) 08/08/2020 0544   PLT 402 (H) 08/08/2020 0544   PLT 312 07/31/2020 1103   MCV 89.2 08/08/2020 0544   MCH 30.4 08/08/2020 0544   MCHC 34.0 08/08/2020 0544   RDW 18.6 (H) 08/08/2020 0544   LYMPHSABS 0.3 (L) 08/08/2020 0544   MONOABS 0.2 08/08/2020 0544   EOSABS 0.0 08/08/2020 0544   BASOSABS 0.0 08/08/2020 0544   CMP Latest Ref Rng & Units 08/08/2020 08/06/2020 08/04/2020  Glucose 70 - 99 mg/dL 123(H) 130(H) 108(H)  BUN 6 - 20 mg/dL 15 15 11   Creatinine 0.61 - 1.24 mg/dL 0.73 0.87 1.05  Sodium 135 - 145 mmol/L 140 140 140  Potassium 3.5 - 5.1 mmol/L 3.4(L) 3.5 3.6  Chloride 98 - 111 mmol/L 106 109 107  CO2 22 - 32 mmol/L 26 21(L) 24  Calcium 8.9 - 10.3 mg/dL 8.8(L) 9.0 8.7(L)  Total Protein 6.5 - 8.1 g/dL 5.1(L) 5.8(L) 5.7(L)  Total Bilirubin 0.3 - 1.2 mg/dL 0.4 0.4 0.4  Alkaline Phos 38 - 126 U/L 64 74 79  AST 15 - 41 U/L 15 17 17   ALT 0 - 44 U/L 18 20 21    Diagnostics: None  Consults: None  Procedures:  None  Disposition:  Discharge disposition: 01-Home or Self Care      Allergies as of 08/08/2020   No Known Allergies     Medication List    TAKE these medications   apixaban 5 MG Tabs tablet Commonly known as: ELIQUIS Take 1 tablet (5 mg total) by mouth 2 (two) times daily.   chlorhexidine 0.12 % solution Commonly known as: PERIDEX Use as directed 15 mLs in the mouth or throat 2 (two) times daily as needed (twice daily during  chemo).   ciprofloxacin 500 MG tablet Commonly known as: CIPRO Take 1 tablet (500 mg total) by mouth daily with breakfast. Resume after finishing Levaquin course. What changed: additional instructions   famciclovir 500 MG tablet Commonly known as: FAMVIR Take 1 tablet (500 mg total) by mouth daily.   fluconazole 100 MG tablet Commonly known as: DIFLUCAN Take 1 tablet (100 mg total) by mouth daily. What changed: when to take this   magic mouthwash Soln Take 5 mLs by mouth 4 (four) times daily as needed for mouth pain.   Melatonin 3 MG Caps Take 3 mg by mouth at bedtime.   ondansetron 4 MG tablet Commonly known as: ZOFRAN Take 1-2 tablets (4-8 mg total) by mouth every 8 (eight) hours as needed for nausea (not responsive to prochlorperazine (COMPAZINE)).   prochlorperazine 10 MG tablet Commonly known as: COMPAZINE Take 1 tablet (10 mg total) by mouth every 6 (six) hours as needed for nausea or vomiting.   sodium bicarbonate/sodium chloride Soln 1 application by Mouth Rinse route every 4 (four) hours.   tadalafil 10 MG tablet Commonly known as: Cialis Take 1 tablet (10 mg total) by mouth daily as needed for erectile dysfunction.  HPI:  Travis Duran is a 45 year old male who was seen for initial consult during his hospitalization on 04/25/2020.  The patient had progressive shortness of breath over a 6-week period of time and a CT of the chest performed on 04/24/2020 showed a large malignant appearing left anterior mediastinal mass measuring 14.6 x 11.7 x 18.5 cm, large left pleural effusion, and small pericardial effusion.  He underwent several thoracenteses during his hospitalization as well as a CT-guided biopsy of the mediastinal mass.  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  Echocardiogram performed on 04/27/2020 showed a LVEF of 60 to 65%. The  patient was started on systemic chemotherapy with EPOCH-R.    A PET scan performed after only 1 cycle of chemotherapy showed considerable reduction in the size of the anterior mediastinal mass and in the left pleural tumor deposits. There was splenic activity and marrow activity likely due to G-CSF.  The patient required hospital admission following cycle #3 of his chemotherapy due to hypoxia.  He was found to have pulmonary emboli and airspace opacities throughout the lungs with consolidation in the lung bases concerning for atypical pneumonia versus hypersensitivity/chemotherapy associated pneumonitis.  Bronchoscopy was performed and he was placed on Levaquin and prednisone.  Pulmonology felt pneumonitis related to G-CSF.  G-CSF was held with cycle #4 of his chemotherapy. He did develop neutropenia but remained afebrile and had no signs of infection.  The patient was seen on the day of admission for cycle #5 of his chemotherapy.  He felt well overall.  He was not having any fevers or chills.  Dyspnea continue to improve.  Denied cough.  He was no longer using Hycodan.  Denied mucositis, chest pain, abdominal pain, nausea, vomiting, constipation, diarrhea.  No bleeding reported.  The patient was admitted as scheduled for cycle #5 of EPOCH-R.   Hospital Course:  Travis Duran started his chemotherapy as planned on 08/04/2020.  He completed 5 days of his chemotherapy and also received rituximab on day 5.  He tolerated his treatment well overall.  Labs have remained stable and he has persistent, stable anemia.  LDH has normalized.    The patient was seen in the morning of 08/08/2020 and he has remained stable.  Once his chemotherapy and rituximab are complete, he will be discharged home.  Physical Exam Vitals reviewed.  Constitutional:      General: He is not in acute distress. HENT:     Head: Normocephalic.     Nose: Nose normal.     Mouth/Throat:     Pharynx: Oropharynx is clear.  Eyes:     General: No  scleral icterus.    Conjunctiva/sclera: Conjunctivae normal.  Cardiovascular:     Rate and Rhythm: Normal rate and regular rhythm.     Pulses: Normal pulses.     Heart sounds: Normal heart sounds.  Pulmonary:     Effort: Pulmonary effort is normal.     Lungs clear bilaterally Abdominal:     General: Bowel sounds are normal. There is no distension.     Palpations: Abdomen is soft.  Musculoskeletal:        General: Normal range of motion.  Skin:    General: Skin is warm and dry.  Neurological:     General: No focal deficit present.     Mental Status: He is alert and oriented to person, place, and time.  Psychiatric:  Mood and Affect: Mood normal.        Behavior: Behavior normal.        Thought Content: Thought content normal.        Judgment: Judgment normal.    Discharge Instructions    Activity as tolerated - No restrictions   Complete by: As directed    Diet general   Complete by: As directed       The patient will return to the cancer center in [facility] on 08/11/2020 for Neulasta.  Will receive brand-name Neulasta instead of a bio similar given possible reaction the last time that he received this.  He will remain on prophylactic Famvir, fluconazole, and Cipro.  A refill for Eliquis was sent to his local pharmacy.  We will arrange for outpatient follow-up at [facility] and he will be contacted with the date and time of this appointment.  Signed: Mikey Bussing 08/08/2020, 11:33 AM

## 2020-08-11 ENCOUNTER — Inpatient Hospital Stay: Payer: BC Managed Care – PPO

## 2020-08-12 ENCOUNTER — Inpatient Hospital Stay: Payer: BC Managed Care – PPO | Attending: Hematology & Oncology

## 2020-08-12 ENCOUNTER — Other Ambulatory Visit: Payer: Self-pay | Admitting: Hematology & Oncology

## 2020-08-12 ENCOUNTER — Other Ambulatory Visit: Payer: Self-pay

## 2020-08-12 ENCOUNTER — Encounter: Payer: Self-pay | Admitting: *Deleted

## 2020-08-12 ENCOUNTER — Other Ambulatory Visit: Payer: Self-pay | Admitting: *Deleted

## 2020-08-12 VITALS — BP 114/81 | HR 119 | Resp 15

## 2020-08-12 DIAGNOSIS — Z5189 Encounter for other specified aftercare: Secondary | ICD-10-CM | POA: Insufficient documentation

## 2020-08-12 DIAGNOSIS — C8582 Other specified types of non-Hodgkin lymphoma, intrathoracic lymph nodes: Secondary | ICD-10-CM

## 2020-08-12 DIAGNOSIS — C8520 Mediastinal (thymic) large B-cell lymphoma, unspecified site: Secondary | ICD-10-CM | POA: Insufficient documentation

## 2020-08-12 MED ORDER — PEGFILGRASTIM INJECTION 6 MG/0.6ML ~~LOC~~
6.0000 mg | PREFILLED_SYRINGE | Freq: Once | SUBCUTANEOUS | Status: AC
  Administered 2020-08-12: 6 mg via SUBCUTANEOUS
  Filled 2020-08-12: qty 0.6

## 2020-08-12 MED ORDER — APIXABAN 5 MG PO TABS: 5.0000 mg | ORAL_TABLET | Freq: Two times a day (BID) | ORAL | 1 refills | Status: DC

## 2020-08-12 NOTE — Patient Instructions (Signed)
Pegfilgrastim injection What is this medicine? PEGFILGRASTIM (PEG fil gra stim) is a long-acting granulocyte colony-stimulating factor that stimulates the growth of neutrophils, a type of white blood cell important in the body's fight against infection. It is used to reduce the incidence of fever and infection in patients with certain types of cancer who are receiving chemotherapy that affects the bone marrow, and to increase survival after being exposed to high doses of radiation. This medicine may be used for other purposes; ask your health care provider or pharmacist if you have questions. COMMON BRAND NAME(S): Fulphila, Neulasta, Nyvepria, UDENYCA, Ziextenzo What should I tell my health care provider before I take this medicine? They need to know if you have any of these conditions:  kidney disease  latex allergy  ongoing radiation therapy  sickle cell disease  skin reactions to acrylic adhesives (On-Body Injector only)  an unusual or allergic reaction to pegfilgrastim, filgrastim, other medicines, foods, dyes, or preservatives  pregnant or trying to get pregnant  breast-feeding How should I use this medicine? This medicine is for injection under the skin. If you get this medicine at home, you will be taught how to prepare and give the pre-filled syringe or how to use the On-body Injector. Refer to the patient Instructions for Use for detailed instructions. Use exactly as directed. Tell your healthcare provider immediately if you suspect that the On-body Injector may not have performed as intended or if you suspect the use of the On-body Injector resulted in a missed or partial dose. It is important that you put your used needles and syringes in a special sharps container. Do not put them in a trash can. If you do not have a sharps container, call your pharmacist or healthcare provider to get one. Talk to your pediatrician regarding the use of this medicine in children. While this drug  may be prescribed for selected conditions, precautions do apply. Overdosage: If you think you have taken too much of this medicine contact a poison control center or emergency room at once. NOTE: This medicine is only for you. Do not share this medicine with others. What if I miss a dose? It is important not to miss your dose. Call your doctor or health care professional if you miss your dose. If you miss a dose due to an On-body Injector failure or leakage, a new dose should be administered as soon as possible using a single prefilled syringe for manual use. What may interact with this medicine? Interactions have not been studied. This list may not describe all possible interactions. Give your health care provider a list of all the medicines, herbs, non-prescription drugs, or dietary supplements you use. Also tell them if you smoke, drink alcohol, or use illegal drugs. Some items may interact with your medicine. What should I watch for while using this medicine? Your condition will be monitored carefully while you are receiving this medicine. You may need blood work done while you are taking this medicine. Talk to your health care provider about your risk of cancer. You may be more at risk for certain types of cancer if you take this medicine. If you are going to need a MRI, CT scan, or other procedure, tell your doctor that you are using this medicine (On-Body Injector only). What side effects may I notice from receiving this medicine? Side effects that you should report to your doctor or health care professional as soon as possible:  allergic reactions (skin rash, itching or hives, swelling of   the face, lips, or tongue)  back pain  dizziness  fever  pain, redness, or irritation at site where injected  pinpoint red spots on the skin  red or dark-brown urine  shortness of breath or breathing problems  stomach or side pain, or pain at the shoulder  swelling  tiredness  trouble  passing urine or change in the amount of urine  unusual bruising or bleeding Side effects that usually do not require medical attention (report to your doctor or health care professional if they continue or are bothersome):  bone pain  muscle pain This list may not describe all possible side effects. Call your doctor for medical advice about side effects. You may report side effects to FDA at 1-800-FDA-1088. Where should I keep my medicine? Keep out of the reach of children. If you are using this medicine at home, you will be instructed on how to store it. Throw away any unused medicine after the expiration date on the label. NOTE: This sheet is a summary. It may not cover all possible information. If you have questions about this medicine, talk to your doctor, pharmacist, or health care provider.  2021 Elsevier/Gold Standard (2019-08-10 13:20:51)  

## 2020-08-12 NOTE — Progress Notes (Signed)
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

## 2020-08-13 ENCOUNTER — Telehealth: Payer: Self-pay | Admitting: *Deleted

## 2020-08-13 ENCOUNTER — Other Ambulatory Visit: Payer: Self-pay | Admitting: *Deleted

## 2020-08-13 MED ORDER — RIVAROXABAN 20 MG PO TABS: 20.0000 mg | ORAL_TABLET | Freq: Every day | ORAL | 6 refills | Status: DC

## 2020-08-13 NOTE — Telephone Encounter (Signed)
Received documentation from [facility] stating that Eliquis is denied.  Dr Marin Olp notified.  Rx sent for Xarelto to [facility]

## 2020-08-16 LAB — ACID FAST CULTURE WITH REFLEXED SENSITIVITIES (MYCOBACTERIA): Acid Fast Culture: NEGATIVE

## 2020-08-19 ENCOUNTER — Encounter: Payer: Self-pay | Admitting: *Deleted

## 2020-08-19 NOTE — Progress Notes (Signed)
See MyChart messages. Appointment for pre-admission COVID testing made for Friday at [facility]. 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

## 2020-08-20 ENCOUNTER — Encounter: Payer: Self-pay | Admitting: *Deleted

## 2020-08-20 NOTE — Progress Notes (Signed)
Notified bed placement, insurance specialist and inpatient chemo team of planned patient admission for cycle 6 of [place] on 08/25/2020. Covid screen scheduled for 08/22/2020. 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

## 2020-08-21 ENCOUNTER — Other Ambulatory Visit: Payer: Self-pay | Admitting: *Deleted

## 2020-08-21 DIAGNOSIS — C8332 Diffuse large B-cell lymphoma, intrathoracic lymph nodes: Secondary | ICD-10-CM

## 2020-08-22 ENCOUNTER — Other Ambulatory Visit: Payer: Self-pay

## 2020-08-22 ENCOUNTER — Inpatient Hospital Stay: Payer: BC Managed Care – PPO

## 2020-08-22 ENCOUNTER — Other Ambulatory Visit: Payer: BC Managed Care – PPO

## 2020-08-22 ENCOUNTER — Other Ambulatory Visit
Admission: RE | Admit: 2020-08-22 | Discharge: 2020-08-22 | Disposition: A | Discharge status: Home / Self Care | Payer: BC Managed Care – PPO | Source: Ambulatory Visit | Attending: Hematology & Oncology | Admitting: Hematology & Oncology

## 2020-08-22 ENCOUNTER — Inpatient Hospital Stay: Payer: BC Managed Care – PPO | Admitting: Hematology & Oncology

## 2020-08-22 ENCOUNTER — Encounter: Payer: Self-pay | Admitting: *Deleted

## 2020-08-22 DIAGNOSIS — Z01812 Encounter for preprocedural laboratory examination: Secondary | ICD-10-CM | POA: Insufficient documentation

## 2020-08-22 DIAGNOSIS — Z20822 Contact with and (suspected) exposure to covid-19: Secondary | ICD-10-CM | POA: Diagnosis not present

## 2020-08-22 NOTE — Progress Notes (Signed)
Patient will not be coming into the office today due to weather. We will proceed with admission on Monday. Called the pre-admission testing center, and they are open for testing. Notified patient that testing site is still open and he must get tested again for admit.   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

## 2020-08-23 LAB — SARS CORONAVIRUS 2 (TAT 6-24 HRS): SARS Coronavirus 2: NEGATIVE

## 2020-08-25 ENCOUNTER — Encounter (HOSPITAL_COMMUNITY): Payer: Self-pay | Admitting: Hematology & Oncology

## 2020-08-25 ENCOUNTER — Inpatient Hospital Stay (HOSPITAL_COMMUNITY): Payer: BC Managed Care – PPO

## 2020-08-25 ENCOUNTER — Inpatient Hospital Stay (HOSPITAL_COMMUNITY)
Admission: RE | Admit: 2020-08-25 | Discharge: 2020-08-29 | DRG: 847 | Disposition: A | Discharge status: Home / Self Care | Payer: BC Managed Care – PPO | Source: Ambulatory Visit | Attending: Hematology & Oncology | Admitting: Hematology & Oncology

## 2020-08-25 ENCOUNTER — Other Ambulatory Visit: Payer: Self-pay

## 2020-08-25 ENCOUNTER — Other Ambulatory Visit: Payer: Self-pay | Admitting: Hematology & Oncology

## 2020-08-25 DIAGNOSIS — D649 Anemia, unspecified: Secondary | ICD-10-CM | POA: Diagnosis not present

## 2020-08-25 DIAGNOSIS — J918 Pleural effusion in other conditions classified elsewhere: Secondary | ICD-10-CM | POA: Diagnosis present

## 2020-08-25 DIAGNOSIS — C8330 Diffuse large B-cell lymphoma, unspecified site: Secondary | ICD-10-CM | POA: Diagnosis present

## 2020-08-25 DIAGNOSIS — C8332 Diffuse large B-cell lymphoma, intrathoracic lymph nodes: Secondary | ICD-10-CM

## 2020-08-25 DIAGNOSIS — C8529 Mediastinal (thymic) large B-cell lymphoma, extranodal and solid organ sites: Secondary | ICD-10-CM | POA: Diagnosis not present

## 2020-08-25 DIAGNOSIS — C8582 Other specified types of non-Hodgkin lymphoma, intrathoracic lymph nodes: Secondary | ICD-10-CM

## 2020-08-25 DIAGNOSIS — C8520 Mediastinal (thymic) large B-cell lymphoma, unspecified site: Secondary | ICD-10-CM | POA: Diagnosis present

## 2020-08-25 DIAGNOSIS — Z5111 Encounter for antineoplastic chemotherapy: Principal | ICD-10-CM

## 2020-08-25 DIAGNOSIS — Z20822 Contact with and (suspected) exposure to covid-19: Secondary | ICD-10-CM | POA: Diagnosis present

## 2020-08-25 DIAGNOSIS — E876 Hypokalemia: Secondary | ICD-10-CM | POA: Diagnosis present

## 2020-08-25 DIAGNOSIS — Z86711 Personal history of pulmonary embolism: Secondary | ICD-10-CM | POA: Diagnosis not present

## 2020-08-25 LAB — CBC WITH DIFFERENTIAL/PLATELET
Abs Immature Granulocytes: 3.06 10*3/uL — ABNORMAL HIGH (ref 0.00–0.07)
Basophils Absolute: 0.3 10*3/uL — ABNORMAL HIGH (ref 0.0–0.1)
Basophils Relative: 1 %
Eosinophils Absolute: 0 10*3/uL (ref 0.0–0.5)
Eosinophils Relative: 0 %
HCT: 29.9 % — ABNORMAL LOW (ref 39.0–52.0)
Hemoglobin: 9.6 g/dL — ABNORMAL LOW (ref 13.0–17.0)
Immature Granulocytes: 13 %
Lymphocytes Relative: 7 %
Lymphs Abs: 1.7 10*3/uL (ref 0.7–4.0)
MCH: 31.2 pg (ref 26.0–34.0)
MCHC: 32.1 g/dL (ref 30.0–36.0)
MCV: 97.1 fL (ref 80.0–100.0)
Monocytes Absolute: 1.7 10*3/uL — ABNORMAL HIGH (ref 0.1–1.0)
Monocytes Relative: 7 %
Neutro Abs: 16.8 10*3/uL — ABNORMAL HIGH (ref 1.7–7.7)
Neutrophils Relative %: 72 %
Platelets: 247 10*3/uL (ref 150–400)
RBC: 3.08 MIL/uL — ABNORMAL LOW (ref 4.22–5.81)
RDW: 20.4 % — ABNORMAL HIGH (ref 11.5–15.5)
WBC: 23.5 10*3/uL — ABNORMAL HIGH (ref 4.0–10.5)
nRBC: 1.4 % — ABNORMAL HIGH (ref 0.0–0.2)

## 2020-08-25 LAB — COMPREHENSIVE METABOLIC PANEL
ALT: 47 U/L — ABNORMAL HIGH (ref 0–44)
AST: 30 U/L (ref 15–41)
Albumin: 3.6 g/dL (ref 3.5–5.0)
Alkaline Phosphatase: 110 U/L (ref 38–126)
Anion gap: 7 (ref 5–15)
BUN: 15 mg/dL (ref 6–20)
CO2: 27 mmol/L (ref 22–32)
Calcium: 9.2 mg/dL (ref 8.9–10.3)
Chloride: 108 mmol/L (ref 98–111)
Creatinine, Ser: 1.16 mg/dL (ref 0.61–1.24)
GFR, Estimated: >60 mL/min (ref >60)
Glucose, Bld: 127 mg/dL — ABNORMAL HIGH (ref 70–99)
Potassium: 3.6 mmol/L (ref 3.5–5.1)
Sodium: 142 mmol/L (ref 135–145)
Total Bilirubin: 0.5 mg/dL (ref 0.3–1.2)
Total Protein: 5.7 g/dL — ABNORMAL LOW (ref 6.5–8.1)

## 2020-08-25 LAB — PHOSPHORUS: Phosphorus: 4 mg/dL (ref 2.5–4.6)

## 2020-08-25 LAB — MAGNESIUM: Magnesium: 2 mg/dL (ref 1.7–2.4)

## 2020-08-25 MED ORDER — SODIUM CHLORIDE 0.9 % IV SOLN
Freq: Once | INTRAVENOUS | Status: AC
  Administered 2020-08-25: 18 mg via INTRAVENOUS
  Filled 2020-08-25: qty 4

## 2020-08-25 MED ORDER — POTASSIUM CHLORIDE IN NACL 20-0.9 MEQ/L-% IV SOLN
INTRAVENOUS | Status: DC
  Filled 2020-08-25 (×6): qty 1000

## 2020-08-25 MED ORDER — SODIUM BICARBONATE/SODIUM CHLORIDE MOUTHWASH
1.0000 "application " | OROMUCOSAL | Status: DC
  Administered 2020-08-25 – 2020-08-29 (×23): 1 via OROMUCOSAL
  Filled 2020-08-25: qty 1000

## 2020-08-25 MED ORDER — CIPROFLOXACIN HCL 500 MG PO TABS
500.0000 mg | ORAL_TABLET | Freq: Every day | ORAL | Status: DC
  Administered 2020-08-26 – 2020-08-29 (×4): 500 mg via ORAL
  Filled 2020-08-25 (×4): qty 1

## 2020-08-25 MED ORDER — FAMCICLOVIR 500 MG PO TABS
500.0000 mg | ORAL_TABLET | Freq: Every day | ORAL | Status: DC
  Administered 2020-08-26 – 2020-08-29 (×4): 500 mg via ORAL
  Filled 2020-08-25 (×4): qty 1

## 2020-08-25 MED ORDER — MELATONIN 3 MG PO TABS
3.0000 mg | ORAL_TABLET | Freq: Every day | ORAL | Status: DC
  Filled 2020-08-25: qty 1

## 2020-08-25 MED ORDER — PREDNISONE 20 MG PO TABS
60.0000 mg | ORAL_TABLET | Freq: Every day | ORAL | Status: AC
  Administered 2020-08-25 – 2020-08-29 (×5): 60 mg via ORAL
  Filled 2020-08-25 (×5): qty 3

## 2020-08-25 MED ORDER — VINCRISTINE SULFATE CHEMO INJECTION 1 MG/ML
Freq: Once | INTRAVENOUS | Status: AC
  Filled 2020-08-25: qty 11

## 2020-08-25 MED ORDER — MAGIC MOUTHWASH
5.0000 mL | Freq: Four times a day (QID) | ORAL | Status: DC | PRN
  Filled 2020-08-25: qty 5

## 2020-08-25 MED ORDER — RIVAROXABAN 20 MG PO TABS
20.0000 mg | ORAL_TABLET | Freq: Every day | ORAL | Status: DC
  Administered 2020-08-25 – 2020-08-28 (×4): 20 mg via ORAL
  Filled 2020-08-25 (×4): qty 1

## 2020-08-25 MED ORDER — FLUCONAZOLE 100 MG PO TABS
100.0000 mg | ORAL_TABLET | Freq: Every evening | ORAL | Status: DC
  Administered 2020-08-25 – 2020-08-28 (×4): 100 mg via ORAL
  Filled 2020-08-25 (×4): qty 1

## 2020-08-25 MED ORDER — ONDANSETRON HCL 4 MG PO TABS: 4.0000 mg | ORAL_TABLET | Freq: Three times a day (TID) | ORAL | Status: DC | PRN

## 2020-08-25 MED ORDER — CHLORHEXIDINE GLUCONATE 0.12 % MT SOLN: 15.0000 mL | Freq: Two times a day (BID) | OROMUCOSAL | Status: DC | PRN

## 2020-08-25 MED ORDER — PROCHLORPERAZINE MALEATE 10 MG PO TABS: 10.0000 mg | ORAL_TABLET | Freq: Four times a day (QID) | ORAL | Status: DC | PRN

## 2020-08-25 NOTE — H&P (Addendum)
Travis Duran  [phone] [phone]  Reason for Admission: Cycle #6 EPOCH-R for primary mediastinal B-cell lymphoma  HPI: Travis Duran is a 45 year old male who was seen for initial consult during his hospitalization on 04/25/2020.  The patient had progressive shortness of breath over a 6-week period of time and a CT of the chest performed on 04/24/2020 showed a large malignant appearing left anterior mediastinal mass measuring 14.6 x 11.7 x 18.5 cm, large left pleural effusion, and small pericardial effusion.  He underwent several thoracenteses during his hospitalization as well as a CT-guided biopsy of the mediastinal mass.  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  Echocardiogram performed on 04/27/2020 showed a LVEF of 60 to 65%.  We were unable to perform a PET scan prior to his first dose of chemotherapy, but he had a PET scan performed on 05/15/2020 following cycle #1.  This showed a considerable reduction in size of the anterior mediastinal mass in the left pleural tumor deposits.  There was splenic activity and marrow activity likely due to G-CSF.  The patient required hospital admission following cycle #3 of his chemotherapy due to hypoxia.  He was found to have pulmonary emboli and airspace opacities throughout the lungs with consolidation in the lung bases concerning for atypical pneumonia versus hypersensitivity/chemotherapy associated pneumonitis.  Bronchoscopy was performed and he was placed on Levaquin and prednisone.  Pulmonology felt pneumonitis related to G-CSF.  G-CSF was held with cycle #4 of his chemotherapy. He did develop neutropenia but remained afebrile and had no signs of infection.  G-CSF was resumed with cycle #5 of his chemotherapy.  The patient presents to the hospital today  for admission for cycle #6 of chemotherapy.  He is feeling well overall.  He has not had any recent fevers or chills.  Denies chest pain, shortness of breath, cough. Denies abdominal pain, nausea, vomiting, constipation, diarrhea.  No bleeding reported.  He reports a good energy levelThe patient is seen today for admission for cycle #6 of EPOCH-R.   Past Medical History:  Diagnosis Date  . Arthritis of right knee   . ED (erectile dysfunction)   . 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 OSA (obstructive sleep apnea) 2007   with AHI 32/hr  . Pulmonary embolism ([place]) 07/02/2020   Past Surgical History:  Procedure Laterality Date  . BRONCHIAL WASHINGS  07/03/2020   Procedure: BRONCHIAL WASHINGS;  Surgeon: Spero Geralds, MD;  Location: [facility] ENDOSCOPY;  Service: Pulmonary;;  . burn to left hand     skin graphs  . FINE NEEDLE ASPIRATION  04/25/2020   Procedure: FINE NEEDLE ASPIRATION (FNA) LINEAR;  Surgeon: Candee Furbish, MD;  Location: [facility] ENDOSCOPY;  Service: Pulmonary;;  . IR IMAGING GUIDED PORT INSERTION  05/07/2020  . IR THORACENTESIS ASP PLEURAL SPACE W/IMG GUIDE  04/25/2020  . IR THORACENTESIS ASP PLEURAL SPACE W/IMG GUIDE  05/07/2020  . REFRACTIVE SURGERY  09/2012   bilateral   . THORACENTESIS  04/25/2020   Procedure: THORACENTESIS;  Surgeon: Candee Furbish, MD;  Location: [facility] ENDOSCOPY;  Service: Pulmonary;;  . VIDEO BRONCHOSCOPY N/A 07/03/2020   Procedure: VIDEO BRONCHOSCOPY WITHOUT FLUORO;  Surgeon: Spero Geralds, MD;  Location: [facility] ENDOSCOPY;  Service: Pulmonary;  Laterality: N/A;  . VIDEO BRONCHOSCOPY WITH ENDOBRONCHIAL ULTRASOUND N/A 04/25/2020   Procedure: VIDEO BRONCHOSCOPY  WITH ENDOBRONCHIAL ULTRASOUND;  Surgeon: Candee Furbish, MD;  Location: [facility] ENDOSCOPY;  Service: Pulmonary;  Laterality: N/A;        Family History  Problem Relation Age of Onset  . Sleep apnea Other   . Hypertension Other   . Emphysema Father   . Hypertension Father   Relation  Age of Onset  . Sleep apnea Other   . Hypertension Other   . Emphysema Father   . Hypertension Father      Socioeconomic History  . Marital status: Married    Spouse name: Not on file  . Number of children: 0  . Years of education: Not on file  . Highest education level: Not on file  Occupational History  . Not on file  Tobacco Use  . Smoking status: Never Smoker  . Smokeless tobacco: Never Used  Vaping Use  . Vaping Use: Never used  Substance and Sexual Activity  . Alcohol use: Yes    Alcohol/week: 0.0 standard drinks    Comment: occ  . Drug use: No  . Sexual activity: Yes    Partners: Female  Other Topics Concern  . Not on file  Social History Narrative  . Not on file   Social Determinants of Health   Financial Resource Strin:   . Difficulty of Paying Living Expenses: Not on file  Food Insecurity:   . Worried About Charity fundraiser in the Last Year: Not on file  . Ran Out of Food in the Last Year: Not on file  Transportation Needs:   . Lack of Transportation (Medical): Not on file  . Lack of Transportation (Non-Medical): Not on file  Physical Activity:   . Days of Exercise per Week: Not on file  . Minutes of Exercise per Session: Not on file  Stress:   . Feeling of Stress : Not on file  Social Connections:   . Frequency of Communication with Friends and Family: Not on file  . Frequency of Social Gatherings with Friends and Family: Not on file  . Attends Religious Services: Not on file  . Active Member of Clubs or Organizations: Not on file  . Attends Archivist Meetings: Not on file  . Marital Status: Not on file  Intimate Partner Violence:   . Fear of Current or Ex-Partner: Not on file  . Emotionally Abused: Not on file  . Physically Abused: Not on file  . Sexually Abused: Not on file  :  Review of Systems: A comprehensive 14 point review of systems was negative except as noted in the HPI.  Exam: BP 109/64 (BP Location: Left Arm)    Pulse 87   Temp 97.8 F (36.6 C) (Oral)   Resp 15   SpO2 98%   General:  well-nourished in no acute distress.   Eyes:  no scleral icterus.   ENT:  There were no oropharyngeal lesions.   Neck was without thyromegaly.   Lymphatics:  Negative cervical, supraclavicular or axillary adenopathy.   Respiratory: Clear to auscultation bilaterally Cardiovascular:  Regular rate and rhythm, S1/S2, without murmur, rub or gallop.  There was no pedal edema.   GI:  abdomen was soft, flat, nontender, nondistended, without organomegaly.   Musculoskeletal: Strength symmetrical in the upper and lower extremities. Skin exam was without echymosis, petichae.   Neuro exam was nonfocal. Patient was alert and oriented.  Attention was good.   Language was appropriate.  Mood was normal without depression.  Speech was not pressured.  Thought content was not tangential.    CBC    Component Value Date/Time   WBC 9.3 08/08/2020 0544   RBC 3.16 (L) 08/08/2020 0544   HGB 9.6 (L) 08/08/2020 0544   HGB 10.1 (L) 07/31/2020 1103   HCT 28.2 (L) 08/08/2020 0544   PLT 402 (H) 08/08/2020 0544   PLT 312 07/31/2020 1103   MCV 89.2 08/08/2020 0544   MCH 30.4 08/08/2020 0544   MCHC 34.0 08/08/2020 0544   RDW 18.6 (H) 08/08/2020 0544   LYMPHSABS 0.3 (L) 08/08/2020 0544   MONOABS 0.2 08/08/2020 0544   EOSABS 0.0 08/08/2020 0544   BASOSABS 0.0 08/08/2020 0544   CMP Latest Ref Rng & Units 08/08/2020 08/06/2020 08/04/2020  Glucose 70 - 99 mg/dL 123(H) 130(H) 108(H)  BUN 6 - 20 mg/dL 15 15 11   Creatinine 0.61 - 1.24 mg/dL 0.73 0.87 1.05  Sodium 135 - 145 mmol/L 140 140 140  Potassium 3.5 - 5.1 mmol/L 3.4(L) 3.5 3.6  Chloride 98 - 111 mmol/L 106 109 107  CO2 22 - 32 mmol/L 26 21(L) 24  Calcium 8.9 - 10.3 mg/dL 8.8(L) 9.0 8.7(L)  Total Protein 6.5 - 8.1 g/dL 5.1(L) 5.8(L) 5.7(L)  Total Bilirubin 0.3 - 1.2 mg/dL 0.4 0.4 0.4  Alkaline Phos 38 - 126 U/L 64 74 79  AST 15 - 41 U/L 15 17 17   ALT 0 - 44 U/L 18 20 21     Assessment  and Plan:  1.  Primary mediastinal B-cell lymphoma 2.  Pleural effusion secondary to #1, improved 3.  Cough due to #1 and #2, improved  -Admit to inpatient oncology unit.  Obtain labs today including a CBC with differential, CMET, magnesium, phosphorus. Plan to proceed with cycle #6 of systemic chemotherapy as planned once labs have been reviewed.Marland Kitchen  He is aware of the possible adverse effects and is agreeable to proceed.   -Regular diet. -He will continue home dose of Xarelto. -Continue home medications. -The patient has antiemetics available to him.  Mikey Bussing, DNP, AGPCNP-BC, AOCNP  ADDENDUM: I saw and examined Travis Duran.  I agree with the above assessment.  I would like to believe this will be his last cycle of chemotherapy.  We will then proceed with consolidative radiation therapy.  We will do the PET scan as an outpatient.  His labs look okay.  His white cell is on the high side because of the Neulasta.  He got tradename Neulasta.  Thankfully there were no side effects with the Neulasta.  His chest x-ray when he came in looks fine.  We will plan for his chemotherapy.  He will get Rituxan on Friday.  I know that he will get outstanding care from all the staff upon 6 E.  I appreciate their compassionate care.  Lattie Haw, MD  Lurena Joiner 1:37

## 2020-08-25 NOTE — Progress Notes (Signed)
Per Travis Duran pt and labs have been evaluated and he is good for treatment today.

## 2020-08-26 ENCOUNTER — Encounter: Payer: Self-pay | Admitting: *Deleted

## 2020-08-26 LAB — URINALYSIS, ROUTINE W REFLEX MICROSCOPIC
Bilirubin Urine: NEGATIVE
Glucose, UA: NEGATIVE mg/dL
Hgb urine dipstick: NEGATIVE
Ketones, ur: NEGATIVE mg/dL
Leukocytes,Ua: NEGATIVE
Nitrite: NEGATIVE
Protein, ur: NEGATIVE mg/dL
Specific Gravity, Urine: 1.018 (ref 1.005–1.030)
pH: 6 (ref 5.0–8.0)

## 2020-08-26 MED ORDER — SODIUM CHLORIDE 0.9 % IV SOLN
Freq: Once | INTRAVENOUS | Status: AC
  Administered 2020-08-26: 18 mg via INTRAVENOUS
  Filled 2020-08-26 (×2): qty 4

## 2020-08-26 MED ORDER — VINCRISTINE SULFATE CHEMO INJECTION 1 MG/ML
Freq: Once | INTRAVENOUS | Status: AC
  Filled 2020-08-26: qty 11

## 2020-08-26 MED ORDER — CHLORHEXIDINE GLUCONATE CLOTH 2 % EX PADS
6.0000 | MEDICATED_PAD | Freq: Every day | CUTANEOUS | Status: DC
  Administered 2020-08-26 – 2020-08-29 (×4): 6 via TOPICAL

## 2020-08-26 NOTE — Progress Notes (Signed)
Oncology Nurse Navigator Documentation  Oncology Nurse Navigator Flowsheets 08/26/2020  Abnormal Finding Date -  Confirmed Diagnosis Date -  Diagnosis Status -  Planned Course of Treatment -  Phase of Treatment -  Chemotherapy Actual Start Date: -  Navigator Follow Up Date: 09/01/2020  Navigator Follow Up Reason: Appointment Review  Navigator Location [facility]  Navigator Encounter Type MyChart;Appt/Treatment Plan Review  Telephone -  Treatment Initiated Date -  Patient Visit Type MedOnc  Treatment Phase Active Tx  Barriers/Navigation Needs Coordination of Care;Education  Education Other  Interventions Coordination of Care;Psycho-Social Support  Acuity Level 3-Moderate Needs (3-4 Barriers Identified)  Coordination of Care Appts  Education Method Written  Support Groups/Services Friends and Family  Time Spent with Patient 30

## 2020-08-27 DIAGNOSIS — C8529 Mediastinal (thymic) large B-cell lymphoma, extranodal and solid organ sites: Secondary | ICD-10-CM

## 2020-08-27 MED ORDER — SODIUM CHLORIDE 0.9 % IV SOLN
Freq: Once | INTRAVENOUS | Status: AC
  Administered 2020-08-27: 8 mg via INTRAVENOUS
  Filled 2020-08-27: qty 4

## 2020-08-27 MED ORDER — VINCRISTINE SULFATE CHEMO INJECTION 1 MG/ML
Freq: Once | INTRAVENOUS | Status: AC
  Filled 2020-08-27: qty 11

## 2020-08-27 NOTE — Progress Notes (Signed)
So far, [name] is doing well.  He has had no problems with the chemotherapy.  He is walking without difficulty.  He has had no nausea or vomiting.  He has had no problems with diarrhea.  There is been no problems with pain.  He has had no cough.  There is no bleeding.  He is eating fairly well.  Hopefully, his wife will bring food in for him tonight.  His vital signs all look stable.  Temperature 97.7.  Pulse 80.  Blood pressure 128/87.  His oral exam shows no mucositis.  There is no adenopathy in the neck.  Lungs are clear bilaterally.  Cardiac exam regular rate and rhythm with no murmurs, rubs or bruits.  Abdomen is soft.  He has good bowel sounds.  There is no fluid wave.  Extremities shows no clubbing, cyanosis or edema.  [name] will continue on his chemotherapy.  We will check lab work on him tomorrow.  I very much appreciate the outstanding care that he is getting from all the staff up on 6 E.  Lattie Haw, MD  2 Chronicles 15:7

## 2020-08-28 MED ORDER — VINCRISTINE SULFATE CHEMO INJECTION 1 MG/ML
Freq: Once | INTRAVENOUS | Status: AC
  Filled 2020-08-28: qty 11

## 2020-08-28 MED ORDER — SODIUM CHLORIDE 0.9 % IV SOLN
Freq: Once | INTRAVENOUS | Status: AC
  Administered 2020-08-28: 8 mg via INTRAVENOUS
  Filled 2020-08-28: qty 4

## 2020-08-28 MED ORDER — ACETAMINOPHEN 325 MG PO TABS
650.0000 mg | ORAL_TABLET | Freq: Once | ORAL | Status: AC
  Administered 2020-08-29: 650 mg via ORAL
  Filled 2020-08-28: qty 2

## 2020-08-28 MED ORDER — SODIUM CHLORIDE 0.9 % IV SOLN
750.0000 mg/m2 | Freq: Once | INTRAVENOUS | Status: AC
  Administered 2020-08-29: 1580 mg via INTRAVENOUS
  Filled 2020-08-28: qty 79

## 2020-08-28 MED ORDER — DIPHENHYDRAMINE HCL 50 MG PO CAPS
50.0000 mg | ORAL_CAPSULE | Freq: Once | ORAL | Status: AC
  Administered 2020-08-29: 50 mg via ORAL
  Filled 2020-08-28: qty 1

## 2020-08-28 MED ORDER — SODIUM CHLORIDE 0.9 % IV SOLN
Freq: Once | INTRAVENOUS | Status: AC
  Administered 2020-08-29: 16 mg via INTRAVENOUS
  Filled 2020-08-28: qty 8

## 2020-08-28 MED ORDER — SODIUM CHLORIDE 0.9 % IV SOLN
375.0000 mg/m2 | Freq: Once | INTRAVENOUS | Status: AC
  Administered 2020-08-29: 800 mg via INTRAVENOUS
  Filled 2020-08-28 (×2): qty 80

## 2020-08-28 NOTE — Progress Notes (Signed)
Right now, Travis Duran did not sleep all that well because of the IV pump.  Otherwise, he is doing okay.  Breathing has had no issues with nausea or vomiting.  He had a good dinner last night that his wife brought him.  There has been no fever.  He has had no mouth sores.  There has been no change in bowel or bladder habits.  He has had no rashes.  There is no bleeding.  He is Xarelto.  He has had no problems with diarrhea.  We will check lab work on him tomorrow.  He has had no cough.  There is been no chest wall pain.  All of his vital signs are stable.  We will plan to initiate treatment tomorrow.  We will recheck his Rituxan tomorrow.  He really has done nicely.  I appreciate the great care that he has gotten from all the staff up on 6 E.    Lattie Haw, MD  Psalm 7:17

## 2020-08-29 DIAGNOSIS — E876 Hypokalemia: Secondary | ICD-10-CM

## 2020-08-29 DIAGNOSIS — D649 Anemia, unspecified: Secondary | ICD-10-CM

## 2020-08-29 LAB — CBC WITH DIFFERENTIAL/PLATELET
Abs Immature Granulocytes: 0.1 10*3/uL — ABNORMAL HIGH (ref 0.00–0.07)
Basophils Absolute: 0 10*3/uL (ref 0.0–0.1)
Basophils Relative: 0 %
Eosinophils Absolute: 0 10*3/uL (ref 0.0–0.5)
Eosinophils Relative: 0 %
HCT: 26.9 % — ABNORMAL LOW (ref 39.0–52.0)
Hemoglobin: 8.9 g/dL — ABNORMAL LOW (ref 13.0–17.0)
Immature Granulocytes: 1 %
Lymphocytes Relative: 3 %
Lymphs Abs: 0.3 10*3/uL — ABNORMAL LOW (ref 0.7–4.0)
MCH: 30.9 pg (ref 26.0–34.0)
MCHC: 33.1 g/dL (ref 30.0–36.0)
MCV: 93.4 fL (ref 80.0–100.0)
Monocytes Absolute: 0.2 10*3/uL (ref 0.1–1.0)
Monocytes Relative: 3 %
Neutro Abs: 7.9 10*3/uL — ABNORMAL HIGH (ref 1.7–7.7)
Neutrophils Relative %: 93 %
Platelets: 385 10*3/uL (ref 150–400)
RBC: 2.88 MIL/uL — ABNORMAL LOW (ref 4.22–5.81)
RDW: 19 % — ABNORMAL HIGH (ref 11.5–15.5)
WBC: 8.5 10*3/uL (ref 4.0–10.5)
nRBC: 0 % (ref 0.0–0.2)

## 2020-08-29 LAB — COMPREHENSIVE METABOLIC PANEL
ALT: 28 U/L (ref 0–44)
AST: 14 U/L — ABNORMAL LOW (ref 15–41)
Albumin: 3.2 g/dL — ABNORMAL LOW (ref 3.5–5.0)
Alkaline Phosphatase: 66 U/L (ref 38–126)
Anion gap: 8 (ref 5–15)
BUN: 16 mg/dL (ref 6–20)
CO2: 25 mmol/L (ref 22–32)
Calcium: 8.8 mg/dL — ABNORMAL LOW (ref 8.9–10.3)
Chloride: 107 mmol/L (ref 98–111)
Creatinine, Ser: 0.74 mg/dL (ref 0.61–1.24)
GFR, Estimated: >60 mL/min (ref >60)
Glucose, Bld: 134 mg/dL — ABNORMAL HIGH (ref 70–99)
Potassium: 3.3 mmol/L — ABNORMAL LOW (ref 3.5–5.1)
Sodium: 140 mmol/L (ref 135–145)
Total Bilirubin: 0.4 mg/dL (ref 0.3–1.2)
Total Protein: 5.1 g/dL — ABNORMAL LOW (ref 6.5–8.1)

## 2020-08-29 LAB — LACTATE DEHYDROGENASE: LDH: 180 U/L (ref 98–192)

## 2020-08-29 MED ORDER — POTASSIUM CHLORIDE CRYS ER 20 MEQ PO TBCR
20.0000 meq | EXTENDED_RELEASE_TABLET | Freq: Once | ORAL | Status: AC
  Administered 2020-08-29: 20 meq via ORAL
  Filled 2020-08-29: qty 1

## 2020-08-29 MED ORDER — HEPARIN SOD (PORK) LOCK FLUSH 100 UNIT/ML IV SOLN
500.0000 [IU] | INTRAVENOUS | Status: DC | PRN
  Filled 2020-08-29: qty 5

## 2020-08-29 NOTE — Discharge Summary (Addendum)
Discharge Summary  Patient ID: Travis Duran MRN: [id number] DOB/AGE: [birth year] 45 y.o.  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[place])   Discharged Condition: good  Discharge Labs:   CBC    Component Value Date/Time   WBC 8.5 08/29/2020 0542   RBC 2.88 (L) 08/29/2020 0542   HGB 8.9 (L) 08/29/2020 0542   HGB 10.1 (L) 07/31/2020 1103   HCT 26.9 (L) 08/29/2020 0542   PLT 385 08/29/2020 0542   PLT 312 07/31/2020 1103   MCV 93.4 08/29/2020 0542   MCH 30.9 08/29/2020 0542   MCHC 33.1 08/29/2020 0542   RDW 19.0 (H) 08/29/2020 0542   LYMPHSABS 0.3 (L) 08/29/2020 0542   MONOABS 0.2 08/29/2020 0542   EOSABS 0.0 08/29/2020 0542   BASOSABS 0.0 08/29/2020 0542   CMP Latest Ref Rng & Units 08/29/2020 08/25/2020 08/08/2020  Glucose 70 - 99 mg/dL 134(H) 127(H) 123(H)  BUN 6 - 20 mg/dL 16 15 15   Creatinine 0.61 - 1.24 mg/dL 0.74 1.16 0.73  Sodium 135 - 145 mmol/L 140 142 140  Potassium 3.5 - 5.1 mmol/L 3.3(L) 3.6 3.4(L)  Chloride 98 - 111 mmol/L 107 108 106  CO2 22 - 32 mmol/L 25 27 26   Calcium 8.9 - 10.3 mg/dL 8.8(L) 9.2 8.8(L)  Total Protein 6.5 - 8.1 g/dL 5.1(L) 5.7(L) 5.1(L)  Total Bilirubin 0.3 - 1.2 mg/dL 0.4 0.5 0.4  Alkaline Phos 38 - 126 U/L 66 110 64  AST 15 - 41 U/L 14(L) 30 15  ALT 0 - 44 U/L 28 47(H) 18   Diagnostics: 08/25/2020 chest x-ray showed no acute cardiopulmonary disease.  Consults: None  Procedures:  None  Disposition:  Discharge disposition: 01-Home or Self Care      Allergies as of 08/29/2020   No Known Allergies     Medication List    STOP taking these medications   apixaban 5 MG Tabs tablet Commonly known as: ELIQUIS     TAKE these medications   chlorhexidine 0.12 % solution Commonly known as: PERIDEX Use as directed 15 mLs in the mouth or throat 2 (two) times daily as  needed (twice daily during chemo).   ciprofloxacin 500 MG tablet Commonly known as: CIPRO Take 1 tablet (500 mg total) by mouth daily with breakfast. Resume after finishing Levaquin course. What changed: additional instructions   famciclovir 500 MG tablet Commonly known as: FAMVIR Take 1 tablet (500 mg total) by mouth daily.   fluconazole 100 MG tablet Commonly known as: DIFLUCAN Take 1 tablet (100 mg total) by mouth daily. What changed: when to take this   magic mouthwash Soln Take 5 mLs by mouth 4 (four) times daily as needed for mouth pain.   Melatonin 3 MG Caps Take 3 mg by mouth at bedtime.   ondansetron 4 MG tablet Commonly known as: ZOFRAN Take 1-2 tablets (4-8 mg total) by mouth every 8 (eight) hours as needed for nausea (not responsive to prochlorperazine (COMPAZINE)).   prochlorperazine 10 MG tablet Commonly known as: COMPAZINE Take 1 tablet (10 mg total) by mouth every 6 (six) hours as needed for nausea or vomiting.   rivaroxaban 20 MG Tabs tablet Commonly known as: XARELTO Take 1 tablet (20 mg total) by mouth daily with supper.   sodium bicarbonate/sodium chloride Soln 1 application by Mouth Rinse route every 4 (four) hours.   tadalafil 10  MG tablet Commonly known as: Cialis Take 1 tablet (10 mg total) by mouth daily as needed for erectile dysfunction.        HPI:  Travis Duran is a 45 year old male who was seen for initial consult during his hospitalization on 04/25/2020.  The patient had progressive shortness of breath over a 6-week period of time and a CT of the chest performed on 04/24/2020 showed a large malignant appearing left anterior mediastinal mass measuring 14.6 x 11.7 x 18.5 cm, large left pleural effusion, and small pericardial effusion.  He underwent several thoracenteses during his hospitalization as well as a CT-guided biopsy of the mediastinal mass.  Cytology from pleural fluid showed cells consistent with large B-cell lymphoma and CT-guided  biopsy also consistent with primary mediastinal large B-cell lymphoma.  Echocardiogram performed on 04/27/2020 showed a LVEF of 60 to 65%. The patient was started on systemic chemotherapy with EPOCH-R.    A PET scan performed after only 1 cycle of chemotherapy showed considerable reduction in the size of the anterior mediastinal mass and in the left pleural tumor deposits. There was splenic activity and marrow activity likely due to G-CSF.  The patient required hospital admission following cycle #3 of his chemotherapy due to hypoxia.  He was found to have pulmonary emboli and airspace opacities throughout the lungs with consolidation in the lung bases concerning for atypical pneumonia versus hypersensitivity/chemotherapy associated pneumonitis.  Bronchoscopy was performed and he was placed on Levaquin and prednisone.  Pulmonology felt pneumonitis related to G-CSF.  G-CSF was held with cycle #4 of his chemotherapy. He did develop neutropenia but remained afebrile and had no signs of infection.  Neulasta was restarted with cycle #5 of his chemotherapy and he tolerated it well.  The patient was seen on the day of admission for cycle #6 of his chemotherapy.  He felt well overall.  He was not having any fevers or chills.  He was not having any cough or shortness of breath. Denied mucositis, chest pain, abdominal pain, nausea, vomiting, constipation, diarrhea.  No bleeding reported.  The patient was admitted as scheduled for cycle #6 of EPOCH-R.   Hospital Course:  Travis Duran started his chemotherapy as planned on 08/25/2020.  He completed 5 days of his chemotherapy and also received rituximab on day 5.  He tolerated his treatment well overall.  Labs have remained stable and he has persistent, stable anemia.  LDH has normalized.  He has mild hypokalemia on the day of discharge and was given K-Dur 20 mEq daily and educated about potassium rich foods.  The patient was seen in the morning of 08/29/2020 and he has  remained stable.  Once his chemotherapy and rituximab are complete, he will be discharged home.  Physical Exam Vitals reviewed.  Constitutional:      General: He is not in acute distress. HENT:     Head: Normocephalic.     Nose: Nose normal.     Mouth/Throat:     Pharynx: Oropharynx is clear.  Eyes:     General: No scleral icterus.    Conjunctiva/sclera: Conjunctivae normal.  Cardiovascular:     Rate and Rhythm: Normal rate and regular rhythm.     Pulses: Normal pulses.     Heart sounds: Normal heart sounds.  Pulmonary:     Effort: Pulmonary effort is normal.     Lungs clear bilaterally Abdominal:     General: Bowel sounds are normal. There is no distension.     Palpations: Abdomen is soft.  Musculoskeletal:        General: Normal range of motion.  Skin:    General: Skin is warm and dry.  Neurological:     General: No focal deficit present.     Mental Status: He is alert and oriented to person, place, and time.  Psychiatric:        Mood and Affect: Mood normal.        Behavior: Behavior normal.        Thought Content: Thought content normal.        Judgment: Judgment normal.    Discharge Instructions    Activity as tolerated - No restrictions   Complete by: As directed    Diet general   Complete by: As directed       The patient will return to the cancer center in [facility] on 09/01/2020 for Neulasta.  Lab and follow-up visit are scheduled for 09/24/2020.  He will remain on prophylactic Famvir, fluconazole, and Cipro.  He will continue on Xarelto.  Signed: Mikey Bussing 08/29/2020, 9:00 AM   ADDENDUM: I agree with the above.  He is doing great.  He had no problems with treatment.  He had no issues with nausea or vomiting.  We will set up the PET scan for him in a couple weeks or so.  We will then proceed with radiation therapy to the bulk of the tumor.  I think this would be reasonable as I think this is where this would recur.  He really has done nicely.  He  has had a very nice response.  He is tolerated treatment well.  Still not sure that this pneumonitis that he had a month ago was from the G-CSF.  He could have been since it was a "bioequivalent" agent.  His labs look good.  He is a little bit anemic but this is not much of a problem for [place].  His renal function is fine.  I cannot find anything on his exam that would cause problems for him.  He has a lot of support at home.  He is incredibly motivated.  As always, he got fantastic care from all the staff on 6 E.  Lattie Haw, MD  Darlyn Chamber 29:11

## 2020-09-01 ENCOUNTER — Encounter: Payer: Self-pay | Admitting: *Deleted

## 2020-09-01 ENCOUNTER — Other Ambulatory Visit: Payer: Self-pay | Admitting: Hematology & Oncology

## 2020-09-01 ENCOUNTER — Other Ambulatory Visit: Payer: Self-pay

## 2020-09-01 ENCOUNTER — Inpatient Hospital Stay: Payer: BC Managed Care – PPO

## 2020-09-01 VITALS — BP 144/82 | HR 99 | Temp 98.3°F | Resp 16

## 2020-09-01 DIAGNOSIS — Z5189 Encounter for other specified aftercare: Secondary | ICD-10-CM | POA: Diagnosis not present

## 2020-09-01 DIAGNOSIS — C8520 Mediastinal (thymic) large B-cell lymphoma, unspecified site: Secondary | ICD-10-CM | POA: Diagnosis present

## 2020-09-01 DIAGNOSIS — C8582 Other specified types of non-Hodgkin lymphoma, intrathoracic lymph nodes: Secondary | ICD-10-CM

## 2020-09-01 DIAGNOSIS — C8332 Diffuse large B-cell lymphoma, intrathoracic lymph nodes: Secondary | ICD-10-CM

## 2020-09-01 MED ORDER — PEGFILGRASTIM INJECTION 6 MG/0.6ML ~~LOC~~
6.0000 mg | PREFILLED_SYRINGE | Freq: Once | SUBCUTANEOUS | Status: AC
  Administered 2020-09-01: 6 mg via SUBCUTANEOUS
  Filled 2020-09-01: qty 0.6

## 2020-09-01 NOTE — Patient Instructions (Signed)
Pegfilgrastim injection What is this medicine? PEGFILGRASTIM (PEG fil gra stim) is a long-acting granulocyte colony-stimulating factor that stimulates the growth of neutrophils, a type of white blood cell important in the body's fight against infection. It is used to reduce the incidence of fever and infection in patients with certain types of cancer who are receiving chemotherapy that affects the bone marrow, and to increase survival after being exposed to high doses of radiation. This medicine may be used for other purposes; ask your health care provider or pharmacist if you have questions. COMMON BRAND NAME(S): Fulphila, Neulasta, Nyvepria, UDENYCA, Ziextenzo What should I tell my health care provider before I take this medicine? They need to know if you have any of these conditions:  kidney disease  latex allergy  ongoing radiation therapy  sickle cell disease  skin reactions to acrylic adhesives (On-Body Injector only)  an unusual or allergic reaction to pegfilgrastim, filgrastim, other medicines, foods, dyes, or preservatives  pregnant or trying to get pregnant  breast-feeding How should I use this medicine? This medicine is for injection under the skin. If you get this medicine at home, you will be taught how to prepare and give the pre-filled syringe or how to use the On-body Injector. Refer to the patient Instructions for Use for detailed instructions. Use exactly as directed. Tell your healthcare provider immediately if you suspect that the On-body Injector may not have performed as intended or if you suspect the use of the On-body Injector resulted in a missed or partial dose. It is important that you put your used needles and syringes in a special sharps container. Do not put them in a trash can. If you do not have a sharps container, call your pharmacist or healthcare provider to get one. Talk to your pediatrician regarding the use of this medicine in children. While this drug  may be prescribed for selected conditions, precautions do apply. Overdosage: If you think you have taken too much of this medicine contact a poison control center or emergency room at once. NOTE: This medicine is only for you. Do not share this medicine with others. What if I miss a dose? It is important not to miss your dose. Call your doctor or health care professional if you miss your dose. If you miss a dose due to an On-body Injector failure or leakage, a new dose should be administered as soon as possible using a single prefilled syringe for manual use. What may interact with this medicine? Interactions have not been studied. This list may not describe all possible interactions. Give your health care provider a list of all the medicines, herbs, non-prescription drugs, or dietary supplements you use. Also tell them if you smoke, drink alcohol, or use illegal drugs. Some items may interact with your medicine. What should I watch for while using this medicine? Your condition will be monitored carefully while you are receiving this medicine. You may need blood work done while you are taking this medicine. Talk to your health care provider about your risk of cancer. You may be more at risk for certain types of cancer if you take this medicine. If you are going to need a MRI, CT scan, or other procedure, tell your doctor that you are using this medicine (On-Body Injector only). What side effects may I notice from receiving this medicine? Side effects that you should report to your doctor or health care professional as soon as possible:  allergic reactions (skin rash, itching or hives, swelling of   the face, lips, or tongue)  back pain  dizziness  fever  pain, redness, or irritation at site where injected  pinpoint red spots on the skin  red or dark-brown urine  shortness of breath or breathing problems  stomach or side pain, or pain at the shoulder  swelling  tiredness  trouble  passing urine or change in the amount of urine  unusual bruising or bleeding Side effects that usually do not require medical attention (report to your doctor or health care professional if they continue or are bothersome):  bone pain  muscle pain This list may not describe all possible side effects. Call your doctor for medical advice about side effects. You may report side effects to FDA at 1-800-FDA-1088. Where should I keep my medicine? Keep out of the reach of children. If you are using this medicine at home, you will be instructed on how to store it. Throw away any unused medicine after the expiration date on the label. NOTE: This sheet is a summary. It may not cover all possible information. If you have questions about this medicine, talk to your doctor, pharmacist, or health care provider.  2021 Elsevier/Gold Standard (2019-08-10 13:20:51)  

## 2020-09-01 NOTE — Progress Notes (Signed)
Patient needs PET scan scheduled. Scheduled for 09/17/2020. Will review calendar, appointments and PET scan preparation when patient is here this afternoon for his injection appointment. Calendar printed and radiology information sheet will be provided.   Oncology Nurse Navigator Documentation  Oncology Nurse Navigator Flowsheets 09/01/2020  Abnormal Finding Date -  Confirmed Diagnosis Date -  Diagnosis Status -  Planned Course of Treatment -  Phase of Treatment -  Chemotherapy Actual Start Date: -  Navigator Follow Up Date: 09/17/2020  Navigator Follow Up Reason: Scan Review  Navigator Location [facility]  Navigator Encounter Type Appt/Treatment Plan Review;Treatment  Telephone -  Treatment Initiated Date -  Patient Visit Type MedOnc  Treatment Phase Active Tx  Barriers/Navigation Needs Coordination of Care;Education  Education Other  Interventions Coordination of Care;Education;Psycho-Social Support  Acuity Level 3-Moderate Needs (3-4 Barriers Identified)  Coordination of Care Appts;Radiology  Education Method Verbal;Written  Support Groups/Services Friends and Family  Time Spent with Patient 30

## 2020-09-17 ENCOUNTER — Other Ambulatory Visit: Payer: Self-pay

## 2020-09-17 ENCOUNTER — Ambulatory Visit
Admission: RE | Admit: 2020-09-17 | Discharge: 2020-09-17 | Disposition: A | Discharge status: Home / Self Care | Payer: BC Managed Care – PPO | Source: Ambulatory Visit | Attending: Hematology & Oncology | Admitting: Hematology & Oncology

## 2020-09-17 DIAGNOSIS — C8332 Diffuse large B-cell lymphoma, intrathoracic lymph nodes: Secondary | ICD-10-CM | POA: Diagnosis present

## 2020-09-17 DIAGNOSIS — E041 Nontoxic single thyroid nodule: Secondary | ICD-10-CM | POA: Diagnosis not present

## 2020-09-17 LAB — GLUCOSE, CAPILLARY: Glucose-Capillary: 72 mg/dL (ref 70–99)

## 2020-09-17 MED ORDER — FLUDEOXYGLUCOSE F - 18 (FDG) INJECTION
10.5600 | Freq: Once | INTRAVENOUS | Status: AC | PRN
  Administered 2020-09-17: 10.56 via INTRAVENOUS

## 2020-09-18 ENCOUNTER — Encounter: Payer: Self-pay | Admitting: *Deleted

## 2020-09-18 NOTE — Progress Notes (Signed)
Oncology Nurse Navigator Documentation  Oncology Nurse Navigator Flowsheets 09/18/2020  Abnormal Finding Date -  Confirmed Diagnosis Date -  Diagnosis Status -  Planned Course of Treatment -  Phase of Treatment -  Chemotherapy Actual Start Date: -  Navigator Follow Up Date: 09/24/2020  Navigator Follow Up Reason: Follow-up Appointment  Navigator Location [facility]  Navigator Encounter Type Scan Review  Telephone -  Treatment Initiated Date -  Patient Visit Type MedOnc  Treatment Phase Active Tx;Post-Tx Follow-up  Barriers/Navigation Needs Coordination of Care;Education  Education -  Interventions None Required  Acuity Level 3-Moderate Needs (3-4 Barriers Identified)  Coordination of Care -  Education Method -  Support Groups/Services Friends and Family  Time Spent with Patient 15

## 2020-09-19 ENCOUNTER — Encounter: Payer: Self-pay | Admitting: *Deleted

## 2020-09-22 ENCOUNTER — Telehealth: Payer: Self-pay

## 2020-09-22 NOTE — Telephone Encounter (Signed)
-----   Message from Volanda Napoleon, MD sent at 09/22/2020  2:55 PM EST ----- Call - the lymphoma seems to be in a good remission.  No worrisome activity now.  Our next step is XRT.  Travis Duran

## 2020-09-22 NOTE — Telephone Encounter (Signed)
Called and informed patient of PET scan results. Patient verbalized understanding and denies any questions or concerns. He will be in on the 23rd for follow up.

## 2020-09-24 ENCOUNTER — Inpatient Hospital Stay: Payer: BC Managed Care – PPO

## 2020-09-24 ENCOUNTER — Inpatient Hospital Stay: Payer: BC Managed Care – PPO | Attending: Hematology & Oncology | Admitting: Hematology & Oncology

## 2020-09-24 ENCOUNTER — Encounter: Payer: Self-pay | Admitting: Hematology & Oncology

## 2020-09-24 ENCOUNTER — Encounter: Payer: Self-pay | Admitting: *Deleted

## 2020-09-24 ENCOUNTER — Other Ambulatory Visit: Payer: Self-pay

## 2020-09-24 VITALS — BP 116/77 | HR 90 | Temp 98.0°F | Resp 17 | Wt 204.0 lb

## 2020-09-24 DIAGNOSIS — Z452 Encounter for adjustment and management of vascular access device: Secondary | ICD-10-CM | POA: Diagnosis not present

## 2020-09-24 DIAGNOSIS — C8522 Mediastinal (thymic) large B-cell lymphoma, intrathoracic lymph nodes: Secondary | ICD-10-CM | POA: Insufficient documentation

## 2020-09-24 DIAGNOSIS — I2699 Other pulmonary embolism without acute cor pulmonale: Secondary | ICD-10-CM | POA: Insufficient documentation

## 2020-09-24 DIAGNOSIS — C8332 Diffuse large B-cell lymphoma, intrathoracic lymph nodes: Secondary | ICD-10-CM

## 2020-09-24 DIAGNOSIS — Z7901 Long term (current) use of anticoagulants: Secondary | ICD-10-CM | POA: Insufficient documentation

## 2020-09-24 DIAGNOSIS — I2782 Chronic pulmonary embolism: Secondary | ICD-10-CM | POA: Diagnosis not present

## 2020-09-24 DIAGNOSIS — I2602 Saddle embolus of pulmonary artery with acute cor pulmonale: Secondary | ICD-10-CM

## 2020-09-24 DIAGNOSIS — C8582 Other specified types of non-Hodgkin lymphoma, intrathoracic lymph nodes: Secondary | ICD-10-CM | POA: Diagnosis not present

## 2020-09-24 DIAGNOSIS — I2692 Saddle embolus of pulmonary artery without acute cor pulmonale: Secondary | ICD-10-CM

## 2020-09-24 DIAGNOSIS — J910 Malignant pleural effusion: Secondary | ICD-10-CM | POA: Insufficient documentation

## 2020-09-24 DIAGNOSIS — Z95828 Presence of other vascular implants and grafts: Secondary | ICD-10-CM

## 2020-09-24 LAB — CBC WITH DIFFERENTIAL (CANCER CENTER ONLY)
Abs Immature Granulocytes: 0.01 10*3/uL (ref 0.00–0.07)
Basophils Absolute: 0 10*3/uL (ref 0.0–0.1)
Basophils Relative: 1 %
Eosinophils Absolute: 0 10*3/uL (ref 0.0–0.5)
Eosinophils Relative: 0 %
HCT: 31 % — ABNORMAL LOW (ref 39.0–52.0)
Hemoglobin: 10 g/dL — ABNORMAL LOW (ref 13.0–17.0)
Immature Granulocytes: 0 %
Lymphocytes Relative: 16 %
Lymphs Abs: 0.9 10*3/uL (ref 0.7–4.0)
MCH: 31.1 pg (ref 26.0–34.0)
MCHC: 32.3 g/dL (ref 30.0–36.0)
MCV: 96.3 fL (ref 80.0–100.0)
Monocytes Absolute: 0.9 10*3/uL (ref 0.1–1.0)
Monocytes Relative: 17 %
Neutro Abs: 3.5 10*3/uL (ref 1.7–7.7)
Neutrophils Relative %: 66 %
Platelet Count: 348 10*3/uL (ref 150–400)
RBC: 3.22 MIL/uL — ABNORMAL LOW (ref 4.22–5.81)
RDW: 18.5 % — ABNORMAL HIGH (ref 11.5–15.5)
WBC Count: 5.3 10*3/uL (ref 4.0–10.5)
nRBC: 0 % (ref 0.0–0.2)

## 2020-09-24 LAB — LACTATE DEHYDROGENASE: LDH: 173 U/L (ref 98–192)

## 2020-09-24 LAB — COMPREHENSIVE METABOLIC PANEL
ALT: 20 U/L (ref 0–44)
AST: 15 U/L (ref 15–41)
Albumin: 4.1 g/dL (ref 3.5–5.0)
Alkaline Phosphatase: 81 U/L (ref 38–126)
Anion gap: 6 (ref 5–15)
BUN: 18 mg/dL (ref 6–20)
CO2: 28 mmol/L (ref 22–32)
Calcium: 9.6 mg/dL (ref 8.9–10.3)
Chloride: 106 mmol/L (ref 98–111)
Creatinine, Ser: 0.92 mg/dL (ref 0.61–1.24)
GFR, Estimated: >60 mL/min (ref >60)
Glucose, Bld: 110 mg/dL — ABNORMAL HIGH (ref 70–99)
Potassium: 3.6 mmol/L (ref 3.5–5.1)
Sodium: 140 mmol/L (ref 135–145)
Total Bilirubin: 0.3 mg/dL (ref 0.3–1.2)
Total Protein: 5.7 g/dL — ABNORMAL LOW (ref 6.5–8.1)

## 2020-09-24 MED ORDER — HEPARIN SOD (PORK) LOCK FLUSH 100 UNIT/ML IV SOLN
500.0000 [IU] | Freq: Once | INTRAVENOUS | Status: AC
  Administered 2020-09-24: 500 [IU] via INTRAVENOUS
  Filled 2020-09-24: qty 5

## 2020-09-24 MED ORDER — SODIUM CHLORIDE 0.9% FLUSH
10.0000 mL | Freq: Once | INTRAVENOUS | Status: AC
  Administered 2020-09-24: 10 mL via INTRAVENOUS
  Filled 2020-09-24: qty 10

## 2020-09-24 NOTE — Patient Instructions (Signed)

## 2020-09-24 NOTE — Progress Notes (Signed)
Hematology and Oncology Follow Up Visit  Travis Duran [id number] [birth year] 45 y.o. 09/24/2020   Principle Diagnosis:   Mediastinal Large B-cell NHL -- (+) pleural effusion  Pulmonary embolism-bilateral  Current Therapy:    S/p cycle #6 of R-EPOCH -- completed in 08/2020  Xarelto 20 mg p.o. daily --started on 07/02/2020     Interim History:  Travis Duran is back for follow-up. He comes in with his wife. Thankfully, she is able to be with him.  He has completed all of his chemotherapy for the mediastinal large cell lymphoma. We did do a PET scan on him. This was done last week. The PET scan really did not show any obvious residual disease. There is mention of a Deauville 3 activity in the anterior mediastinum. This was only 2.5 cm with an SUV of 2.6.  Of note there is also a cold nodule in the left thyroid lobe. Again I'm not sure what this really means but we have to check this out.  He now is ready for radiation therapy. I have spoken to Dr. Berton Mount of radiation oncology at [facility]. This is somewhat easier for Travis Duran since he lives out that way. Dr. Donella Stade feels that he would be a good candidate for radiation therapy to consolidate his treatment.  He has had no cough. He has had no chest pain. He has had no nausea or vomiting. He has had no change in bowel or bladder habits. There has been no fever.  Currently, his performance status is ECOG 0.    Medications:  Current Outpatient Medications:  .  chlorhexidine (PERIDEX) 0.12 % solution, Use as directed 15 mLs in the mouth or throat 2 (two) times daily as needed (twice daily during chemo)., Disp: , Rfl:  .  ciprofloxacin (CIPRO) 500 MG tablet, Take 1 tablet (500 mg total) by mouth daily with breakfast. Resume after finishing Levaquin course. (Patient taking differently: Take 500 mg by mouth daily with breakfast.), Disp: 30 tablet, Rfl: 5 .  famciclovir (FAMVIR) 500 MG tablet, Take 1 tablet (500 mg total) by mouth daily.,  Disp: 30 tablet, Rfl: 5 .  fluconazole (DIFLUCAN) 100 MG tablet, Take 1 tablet (100 mg total) by mouth daily. (Patient taking differently: Take 100 mg by mouth every evening.), Disp: 14 tablet, Rfl: 5 .  magic mouthwash SOLN, Take 5 mLs by mouth 4 (four) times daily as needed for mouth pain. (Patient not taking: Reported on 08/25/2020), Disp: 480 mL, Rfl: 6 .  Melatonin 3 MG CAPS, Take 3 mg by mouth at bedtime., Disp: , Rfl:  .  ondansetron (ZOFRAN) 4 MG tablet, Take 1-2 tablets (4-8 mg total) by mouth every 8 (eight) hours as needed for nausea (not responsive to prochlorperazine (COMPAZINE))., Disp: 20 tablet, Rfl: 0 .  prochlorperazine (COMPAZINE) 10 MG tablet, Take 1 tablet (10 mg total) by mouth every 6 (six) hours as needed for nausea or vomiting., Disp: 30 tablet, Rfl: 0 .  rivaroxaban (XARELTO) 20 MG TABS tablet, Take 1 tablet (20 mg total) by mouth daily with supper., Disp: 30 tablet, Rfl: 6 .  Sodium Chloride-Sodium Bicarb (SODIUM BICARBONATE/SODIUM CHLORIDE) SOLN, 1 application by Mouth Rinse route every 4 (four) hours., Disp: , Rfl:  .  tadalafil (CIALIS) 10 MG tablet, Take 1 tablet (10 mg total) by mouth daily as needed for erectile dysfunction., Disp: 10 tablet, Rfl: 11  Allergies: No Known Allergies  Past Medical History, Surgical history, Social history, and Family History were reviewed and updated.  Review  of Systems: Review of Systems  Constitutional: Negative for unexpected weight change.  HENT:   Negative for mouth sores.   Eyes: Negative.   Respiratory: Positive for chest tightness, cough and shortness of breath.   Cardiovascular: Negative.   Gastrointestinal: Negative.   Endocrine: Negative.   Genitourinary: Negative.    Musculoskeletal: Negative.   Skin: Negative.   Neurological: Negative.   Hematological: Negative.   Psychiatric/Behavioral: Negative.     Physical Exam:  weight is 204 lb (92.5 kg). His oral temperature is 98 F (36.7 C). His blood pressure is  116/77 and his pulse is 90. His respiration is 17 and oxygen saturation is 100%.   Wt Readings from Last 3 Encounters:  09/24/20 204 lb (92.5 kg)  08/25/20 190 lb (86.2 kg)  08/04/20 190 lb 3.2 oz (86.3 kg)    Physical Exam Vitals reviewed.  HENT:     Head: Normocephalic and atraumatic.  Eyes:     Pupils: Pupils are equal, round, and reactive to light.  Cardiovascular:     Rate and Rhythm: Regular rhythm. Tachycardia present.     Heart sounds: Normal heart sounds.     Comments: Cardiac exam is tachycardic but regular.  He has no murmurs rubs or bruits. Pulmonary:     Comments: Pulmonary exam shows wheezing bilaterally.  He has crackles bilaterally.  He has decent air movement.   Abdominal:     General: Bowel sounds are normal.     Palpations: Abdomen is soft.  Musculoskeletal:        General: No tenderness or deformity. Normal range of motion.     Cervical back: Normal range of motion.  Lymphadenopathy:     Cervical: No cervical adenopathy.  Skin:    General: Skin is warm and dry.     Findings: No erythema or rash.  Neurological:     Mental Status: He is alert and oriented to person, place, and time.  Psychiatric:        Behavior: Behavior normal.        Thought Content: Thought content normal.        Judgment: Judgment normal.    Lab Results  Component Value Date   WBC 5.3 09/24/2020   HGB 10.0 (L) 09/24/2020   HCT 31.0 (L) 09/24/2020   MCV 96.3 09/24/2020   PLT 348 09/24/2020     Chemistry      Component Value Date/Time   NA 140 09/24/2020 1520   K 3.6 09/24/2020 1520   CL 106 09/24/2020 1520   CO2 28 09/24/2020 1520   BUN 18 09/24/2020 1520   CREATININE 0.92 09/24/2020 1520   CREATININE 1.04 07/31/2020 1103      Component Value Date/Time   CALCIUM 9.6 09/24/2020 1520   ALKPHOS 81 09/24/2020 1520   AST 15 09/24/2020 1520   AST 15 07/31/2020 1103   ALT 20 09/24/2020 1520   ALT 31 07/31/2020 1103   BILITOT 0.3 09/24/2020 1520   BILITOT 0.2 (L)  07/31/2020 1103      Impression and Plan: Travis Duran is a really nice 45 year old white male.  He presented with a massive mediastinal tumor.  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  This is a B-cell lymphoma.  I feel that he has responded as expected. I would not believe that there is any residual disease. I do think that radiation therapy would really be helpful given the size  of his initial tumor.  He will see radiation oncology on Monday.  I suspect he will get 3 weeks of radiation therapy.  I would not repeat a PET scan probably till the end of May.  I'm just happy that he is doing so much better. When we first saw him, he was really having a tough time because of the size of this tumor, the pleural effusion, and the cough that he had.  Of note, he does have the pulmonary embolism. At some point, we will have to check this again. This would be with a dedicated CT angiogram.  He is on Xarelto. We'll keep him on Xarelto at a therapeutic dose for 6 months. I will then put him on maintenance at 10 mg a day for 1 year.  I would like to get him back in 6 weeks. By then, he will have finished radiation.   Volanda Napoleon, MD 2/23/20224:44 PM

## 2020-09-24 NOTE — Progress Notes (Signed)
Patient is here after completing his chemotherapy treatment. He will now move onto radiation. His consult has already been placed and he has his appointment on 09/29/20.  Oncology Nurse Navigator Documentation  Oncology Nurse Navigator Flowsheets 09/24/2020  Abnormal Finding Date -  Confirmed Diagnosis Date -  Diagnosis Status -  Planned Course of Treatment -  Phase of Treatment Radiation  Chemotherapy Actual Start Date: -  Radiation Pending-Reason: Authorization  Navigator Follow Up Date: 09/29/2020  Navigator Follow Up Reason: Appointment Review  Navigator Location [facility]  Navigator Encounter Type Treatment  Telephone -  Treatment Initiated Date -  Patient Visit Type MedOnc  Treatment Phase Active Tx;Post-Tx Follow-up  Barriers/Navigation Needs Coordination of Care;Education  Education Other  Interventions Education;Psycho-Social Support  Acuity Level 3-Moderate Needs (3-4 Barriers Identified)  Coordination of Care -  Education Method Verbal  Support Groups/Services Friends and Family  Time Spent with Patient 30

## 2020-09-25 ENCOUNTER — Telehealth: Payer: Self-pay | Admitting: *Deleted

## 2020-09-25 NOTE — Telephone Encounter (Signed)
Called patient and gave him upcoming apts. - mailed calendar - view mychart

## 2020-09-29 ENCOUNTER — Ambulatory Visit
Admission: RE | Admit: 2020-09-29 | Discharge: 2020-09-29 | Disposition: A | Discharge status: Home / Self Care | Payer: BC Managed Care – PPO | Source: Ambulatory Visit | Attending: Radiation Oncology | Admitting: Radiation Oncology

## 2020-09-29 ENCOUNTER — Encounter: Payer: Self-pay | Admitting: *Deleted

## 2020-09-29 VITALS — BP 114/79 | HR 92 | Temp 97.2°F

## 2020-09-29 DIAGNOSIS — C8332 Diffuse large B-cell lymphoma, intrathoracic lymph nodes: Secondary | ICD-10-CM

## 2020-09-29 NOTE — Progress Notes (Signed)
Oncology Nurse Navigator Documentation  Oncology Nurse Navigator Flowsheets 09/29/2020  Abnormal Finding Date -  Confirmed Diagnosis Date -  Diagnosis Status -  Planned Course of Treatment -  Phase of Treatment Radiation  Chemotherapy Actual Start Date: -  Radiation Pending-Reason: Authorization  Navigator Follow Up Date: 10/03/2020  Navigator Follow Up Reason: Appointment Review  Navigator Location [facility]  Navigator Encounter Type Appt/Treatment Plan Review  Telephone -  Treatment Initiated Date -  Patient Visit Type MedOnc  Treatment Phase Active Tx;Post-Tx Follow-up  Barriers/Navigation Needs Coordination of Care;Education  Education -  Interventions None Required  Acuity Level 3-Moderate Needs (3-4 Barriers Identified)  Coordination of Care -  Education Method -  Support Groups/Services Friends and Family  Time Spent with Patient 15

## 2020-09-29 NOTE — Consult Note (Signed)
NEW PATIENT EVALUATION  Name: Travis Duran  MRN: [id number]  Date:   09/29/2020     DOB: January 04, 1976   This 45 y.o. male patient presents to the clinic for initial evaluation of mediastinal large B cell non-Hodgkin's lymphoma with positive pleural effusion status post 6 cycles of R-EPOCH with excellent response.  REFERRING PHYSICIAN: Laurey Morale, MD  CHIEF COMPLAINT: No chief complaint on file.   DIAGNOSIS: There were no encounter diagnoses.   PREVIOUS INVESTIGATIONS:  Serial CT scans and PET CT scans reviewed Clinical notes reviewed Pathology report reviewed  HPI: Patient is a 45 year old male whose history dates back to September when he presented with increasing cough and shortness of breath.  Initial CT scan showed a large anterior mediastinal mass and bilateral pleural effusions.  He had a lesion in his liver which MRI showed to be 1.6 cm complex cyst in the posterior right hepatic lobe.  Patient had a needle core biopsy of the mediastinal mass which was positive for large B-cell lymphoma.  Patient was started on R-EPOCH chemotherapy by Dr. Marin Olp.  His first PET CT scan was in October showing considerable reduction in size of the anterior mediastinal mass and left pleural tumor deposits.  Patient has completed all 6 cycles of chemotherapy in January he had a repeat PET PET CT scan this month showing stable anterior midsternal soft tissue density measuring 2.5 cm in short axis with an SUV of 2.6 a Deauville 3), patient also does has incidentally a hypointense thyroid nodule on the left which is being followed patient also had diffuse activity throughout the skeleton probably representing granulocyte stimulation.  Patient feels well specifically denies fever chills night sweats or weight loss he is actually increased his weight over the past several months.  He is now referred to [facility] for consideration of involved field treatment.  Patient initially did have pulmonary embolism  which she he is currently on Xarelto and tolerating that well.  PLANNED TREATMENT REGIMEN: Involved field treatment to the chest  PAST MEDICAL HISTORY:  has a past medical history of Arthritis of right knee, ED (erectile dysfunction), Lymphoma, large cell, intrathoracic lymph nodes ([place]) (05/01/2020), OSA (obstructive sleep apnea) (2007), and Pulmonary embolism ([place]) (07/02/2020).    PAST SURGICAL HISTORY:  Past Surgical History:  Procedure Laterality Date  . BRONCHIAL WASHINGS  07/03/2020   Procedure: BRONCHIAL WASHINGS;  Surgeon: Spero Geralds, MD;  Location: [facility] ENDOSCOPY;  Service: Pulmonary;;  . burn to left hand     skin graphs  . FINE NEEDLE ASPIRATION  04/25/2020   Procedure: FINE NEEDLE ASPIRATION (FNA) LINEAR;  Surgeon: Candee Furbish, MD;  Location: [facility] ENDOSCOPY;  Service: Pulmonary;;  . IR IMAGING GUIDED PORT INSERTION  05/07/2020  . IR THORACENTESIS ASP PLEURAL SPACE W/IMG GUIDE  04/25/2020  . IR THORACENTESIS ASP PLEURAL SPACE W/IMG GUIDE  05/07/2020  . REFRACTIVE SURGERY  09/2012   bilateral   . THORACENTESIS  04/25/2020   Procedure: THORACENTESIS;  Surgeon: Candee Furbish, MD;  Location: [facility] ENDOSCOPY;  Service: Pulmonary;;  . VIDEO BRONCHOSCOPY N/A 07/03/2020   Procedure: VIDEO BRONCHOSCOPY WITHOUT FLUORO;  Surgeon: Spero Geralds, MD;  Location: [facility] ENDOSCOPY;  Service: Pulmonary;  Laterality: N/A;  . VIDEO BRONCHOSCOPY WITH ENDOBRONCHIAL ULTRASOUND N/A 04/25/2020   Procedure: VIDEO BRONCHOSCOPY WITH ENDOBRONCHIAL ULTRASOUND;  Surgeon: Candee Furbish, MD;  Location: [facility] ENDOSCOPY;  Service: Pulmonary;  Laterality: N/A;    FAMILY HISTORY: family history includes Emphysema in his father; Hypertension in his  father and another family member; Sleep apnea in an other family member.  SOCIAL HISTORY:  reports that he has never smoked. He has never used smokeless tobacco. He reports current alcohol use. He reports that he does not use drugs.  ALLERGIES: Patient has no known  allergies.  MEDICATIONS:  Current Outpatient Medications  Medication Sig Dispense Refill  . famciclovir (FAMVIR) 500 MG tablet Take 1 tablet (500 mg total) by mouth daily. 30 tablet 5  . Melatonin 3 MG CAPS Take 3 mg by mouth at bedtime.    . rivaroxaban (XARELTO) 20 MG TABS tablet Take 1 tablet (20 mg total) by mouth daily with supper. 30 tablet 6  . chlorhexidine (PERIDEX) 0.12 % solution Use as directed 15 mLs in the mouth or throat 2 (two) times daily as needed (twice daily during chemo). (Patient not taking: Reported on 09/29/2020)    . ciprofloxacin (CIPRO) 500 MG tablet Take 1 tablet (500 mg total) by mouth daily with breakfast. Resume after finishing Levaquin course. (Patient not taking: Reported on 09/29/2020) 30 tablet 5  . fluconazole (DIFLUCAN) 100 MG tablet Take 1 tablet (100 mg total) by mouth daily. (Patient not taking: Reported on 09/29/2020) 14 tablet 5  . magic mouthwash SOLN Take 5 mLs by mouth 4 (four) times daily as needed for mouth pain. (Patient not taking: No sig reported) 480 mL 6  . ondansetron (ZOFRAN) 4 MG tablet Take 1-2 tablets (4-8 mg total) by mouth every 8 (eight) hours as needed for nausea (not responsive to prochlorperazine (COMPAZINE)). (Patient not taking: Reported on 09/29/2020) 20 tablet 0  . prochlorperazine (COMPAZINE) 10 MG tablet Take 1 tablet (10 mg total) by mouth every 6 (six) hours as needed for nausea or vomiting. (Patient not taking: Reported on 09/29/2020) 30 tablet 0  . Sodium Chloride-Sodium Bicarb (SODIUM BICARBONATE/SODIUM CHLORIDE) SOLN 1 application by Mouth Rinse route every 4 (four) hours. (Patient not taking: Reported on 09/29/2020)    . tadalafil (CIALIS) 10 MG tablet Take 1 tablet (10 mg total) by mouth daily as needed for erectile dysfunction. (Patient not taking: Reported on 09/29/2020) 10 tablet 11   No current facility-administered medications for this encounter.    ECOG PERFORMANCE STATUS:  0 - Asymptomatic  REVIEW OF SYSTEMS: Patient  denies any weight loss, fatigue, weakness, fever, chills or night sweats. Patient denies any loss of vision, blurred vision. Patient denies any ringing  of the ears or hearing loss. No irregular heartbeat. Patient denies heart murmur or history of fainting. Patient denies any chest pain or pain radiating to her upper extremities. Patient denies any shortness of breath, difficulty breathing at night, cough or hemoptysis. Patient denies any swelling in the lower legs. Patient denies any nausea vomiting, vomiting of blood, or coffee ground material in the vomitus. Patient denies any stomach pain. Patient states has had normal bowel movements no significant constipation or diarrhea. Patient denies any dysuria, hematuria or significant nocturia. Patient denies any problems walking, swelling in the joints or loss of balance. Patient denies any skin changes, loss of hair or loss of weight. Patient denies any excessive worrying or anxiety or significant depression. Patient denies any problems with insomnia. Patient denies excessive thirst, polyuria, polydipsia. Patient denies any swollen glands, patient denies easy bruising or easy bleeding. Patient denies any recent infections, allergies or URI. Patient "s visual fields have not changed significantly in recent time.   PHYSICAL EXAM: BP 114/79   Pulse 92   Temp (!) 97.2 F (36.2 C) (Tympanic)  No peripheral adenopathy is identified.  Well-developed well-nourished patient in NAD. HEENT reveals PERLA, EOMI, discs not visualized.  Oral cavity is clear. No oral mucosal lesions are identified. Neck is clear without evidence of cervical or supraclavicular adenopathy. Lungs are clear to A&P. Cardiac examination is essentially unremarkable with regular rate and rhythm without murmur rub or thrill. Abdomen is benign with no organomegaly or masses noted. Motor sensory and DTR levels are equal and symmetric in the upper and lower extremities. Cranial nerves II through XII are  grossly intact. Proprioception is intact. No peripheral adenopathy or edema is identified. No motor or sensory levels are noted. Crude visual fields are within normal range.  LABORATORY DATA: Pathology and cytology report reviewed    RADIOLOGY RESULTS: CT scans and PET CT scans reviewed as well as MRI of the liver   IMPRESSION: Mediastinal presentation of diffuse large B-cell lymphoma status post 6 cycles of R-EPOCH chemotherapy with excellent response for involved field treatment.  PLAN: This time elected ahead with radiation therapy to area of potential residual disease in his anterior mediastinum.  Would plan on delivering 3600 cGy over 3-1/2 weeks.  We will use PET fusion study to delineate areas of previous tumor involvement and cover that with my involved field treatment.  Risks and benefits of treatment occluding possible radiation esophagitis possible exposure to the heart fatigue skin reaction alteration of blood counts all were discussed in detail with the patient and his wife.  I believe from receiving heart radiation it may be beneficial to start the patient on statins and I will discuss that with Dr. Marin Olp after completion of radiation.  I have personally set up and ordered CT simulation for later this week.  Patient and wife both comprehend my treatment plan well.  I would like to take this opportunity to thank you for allowing me to participate in the care of your patient.Noreene Filbert, MD

## 2020-10-02 ENCOUNTER — Ambulatory Visit
Admission: RE | Admit: 2020-10-02 | Discharge: 2020-10-02 | Disposition: A | Discharge status: Home / Self Care | Payer: BC Managed Care – PPO | Source: Ambulatory Visit | Attending: Radiation Oncology | Admitting: Radiation Oncology

## 2020-10-02 DIAGNOSIS — Z510 Encounter for antineoplastic radiation therapy: Secondary | ICD-10-CM | POA: Insufficient documentation

## 2020-10-02 DIAGNOSIS — C8332 Diffuse large B-cell lymphoma, intrathoracic lymph nodes: Secondary | ICD-10-CM | POA: Diagnosis present

## 2020-10-03 ENCOUNTER — Other Ambulatory Visit: Payer: Self-pay | Admitting: *Deleted

## 2020-10-03 ENCOUNTER — Encounter: Payer: Self-pay | Admitting: *Deleted

## 2020-10-03 DIAGNOSIS — C8332 Diffuse large B-cell lymphoma, intrathoracic lymph nodes: Secondary | ICD-10-CM

## 2020-10-03 NOTE — Progress Notes (Signed)
Oncology Nurse Navigator Documentation  Oncology Nurse Navigator Flowsheets 10/03/2020  Abnormal Finding Date -  Confirmed Diagnosis Date -  Diagnosis Status -  Planned Course of Treatment -  Phase of Treatment Radiation  Chemotherapy Actual Start Date: -  Radiation Pending-Reason: -  Navigator Follow Up Date: 10/09/2020  Navigator Follow Up Reason: Radiation  Navigator Location [facility]  Navigator Encounter Type Appt/Treatment Plan Review  Telephone -  Treatment Initiated Date -  Patient Visit Type MedOnc  Treatment Phase Active Tx  Barriers/Navigation Needs Coordination of Care;Education  Education -  Interventions None Required  Acuity Level 3-Moderate Needs (3-4 Barriers Identified)  Coordination of Care -  Education Method -  Support Groups/Services Friends and Family  Time Spent with Patient 15

## 2020-10-06 DIAGNOSIS — Z510 Encounter for antineoplastic radiation therapy: Secondary | ICD-10-CM | POA: Diagnosis not present

## 2020-10-09 ENCOUNTER — Encounter: Payer: Self-pay | Admitting: *Deleted

## 2020-10-09 ENCOUNTER — Ambulatory Visit: Admission: RE | Admit: 2020-10-09 | Payer: BC Managed Care – PPO | Source: Ambulatory Visit

## 2020-10-09 DIAGNOSIS — Z510 Encounter for antineoplastic radiation therapy: Secondary | ICD-10-CM | POA: Diagnosis not present

## 2020-10-09 NOTE — Progress Notes (Signed)
Oncology Nurse Navigator Documentation  Oncology Nurse Navigator Flowsheets 10/09/2020  Abnormal Finding Date -  Confirmed Diagnosis Date -  Diagnosis Status -  Planned Course of Treatment -  Phase of Treatment Radiation  Chemotherapy Actual Start Date: -  Radiation Pending-Reason: -  Radiation Actual Start Date: 10/09/2020  Radiation Expected End Date: 11/05/2020  Navigator Follow Up Date: 11/05/2020  Navigator Follow Up Reason: Follow-up Appointment  Navigator Location [facility]  Navigator Encounter Type Appt/Treatment Plan Review  Telephone -  Treatment Initiated Date -  Patient Visit Type MedOnc  Treatment Phase Active Tx  Barriers/Navigation Needs Coordination of Care;Education  Education -  Interventions None Required  Acuity Level 2-Minimal Needs (1-2 Barriers Identified)  Coordination of Care -  Education Method -  Support Groups/Services Friends and Family  Time Spent with Patient 30

## 2020-10-10 ENCOUNTER — Ambulatory Visit
Admission: RE | Admit: 2020-10-10 | Discharge: 2020-10-10 | Disposition: A | Discharge status: Home / Self Care | Payer: BC Managed Care – PPO | Source: Ambulatory Visit | Attending: Hematology & Oncology | Admitting: Hematology & Oncology

## 2020-10-10 ENCOUNTER — Other Ambulatory Visit: Payer: Self-pay

## 2020-10-10 DIAGNOSIS — C8582 Other specified types of non-Hodgkin lymphoma, intrathoracic lymph nodes: Secondary | ICD-10-CM | POA: Diagnosis present

## 2020-10-13 ENCOUNTER — Other Ambulatory Visit: Payer: Self-pay | Admitting: Hematology & Oncology

## 2020-10-13 ENCOUNTER — Encounter: Payer: Self-pay | Admitting: *Deleted

## 2020-10-13 ENCOUNTER — Ambulatory Visit
Admission: RE | Admit: 2020-10-13 | Discharge: 2020-10-13 | Disposition: A | Discharge status: Home / Self Care | Payer: BC Managed Care – PPO | Source: Ambulatory Visit | Attending: Radiation Oncology | Admitting: Radiation Oncology

## 2020-10-13 DIAGNOSIS — E042 Nontoxic multinodular goiter: Secondary | ICD-10-CM

## 2020-10-13 DIAGNOSIS — Z510 Encounter for antineoplastic radiation therapy: Secondary | ICD-10-CM | POA: Diagnosis not present

## 2020-10-14 ENCOUNTER — Encounter: Payer: Self-pay | Admitting: *Deleted

## 2020-10-14 ENCOUNTER — Ambulatory Visit
Admission: RE | Admit: 2020-10-14 | Discharge: 2020-10-14 | Disposition: A | Discharge status: Home / Self Care | Payer: BC Managed Care – PPO | Source: Ambulatory Visit | Attending: Radiation Oncology | Admitting: Radiation Oncology

## 2020-10-14 DIAGNOSIS — Z510 Encounter for antineoplastic radiation therapy: Secondary | ICD-10-CM | POA: Diagnosis not present

## 2020-10-14 NOTE — Progress Notes (Signed)
Oncology Nurse Navigator Documentation  Oncology Nurse Navigator Flowsheets 10/14/2020  Abnormal Finding Date -  Confirmed Diagnosis Date -  Diagnosis Status -  Planned Course of Treatment -  Phase of Treatment -  Chemotherapy Actual Start Date: -  Radiation Pending-Reason: -  Radiation Actual Start Date: -  Radiation Expected End Date: -  Navigator Follow Up Date: 10/15/2020  Navigator Follow Up Reason: Appointment Review  Navigator Location [facility]  Navigator Encounter Type MyChart  Telephone -  Treatment Initiated Date -  Patient Visit Type MedOnc  Treatment Phase Active Tx  Barriers/Navigation Needs Coordination of Care;Education  Education Other  Interventions Education;Psycho-Social Support  Acuity Level 2-Minimal Needs (1-2 Barriers Identified)  Coordination of Care -  Education Method Written  Support Groups/Services Friends and Family  Time Spent with Patient 30

## 2020-10-15 ENCOUNTER — Ambulatory Visit
Admission: RE | Admit: 2020-10-15 | Discharge: 2020-10-15 | Disposition: A | Discharge status: Home / Self Care | Payer: BC Managed Care – PPO | Source: Ambulatory Visit | Attending: Radiation Oncology | Admitting: Radiation Oncology

## 2020-10-15 DIAGNOSIS — Z510 Encounter for antineoplastic radiation therapy: Secondary | ICD-10-CM | POA: Diagnosis not present

## 2020-10-16 ENCOUNTER — Ambulatory Visit
Admission: RE | Admit: 2020-10-16 | Discharge: 2020-10-16 | Disposition: A | Discharge status: Home / Self Care | Payer: BC Managed Care – PPO | Source: Ambulatory Visit | Attending: Radiation Oncology | Admitting: Radiation Oncology

## 2020-10-16 ENCOUNTER — Other Ambulatory Visit: Payer: Self-pay | Admitting: Internal Medicine

## 2020-10-16 ENCOUNTER — Other Ambulatory Visit: Payer: Self-pay | Admitting: Family

## 2020-10-16 ENCOUNTER — Telehealth: Payer: Self-pay | Admitting: *Deleted

## 2020-10-16 DIAGNOSIS — E042 Nontoxic multinodular goiter: Secondary | ICD-10-CM

## 2020-10-16 DIAGNOSIS — Z510 Encounter for antineoplastic radiation therapy: Secondary | ICD-10-CM | POA: Diagnosis not present

## 2020-10-16 NOTE — Telephone Encounter (Addendum)
Call received from [place] at [place] wanting to know when pt should stop Xarelto prior to thyroid biopsies.  Informed Pamala Hurry that pt should hold Xarelto for two days prior to biopsy and to restart the day after biopsy if there is no bleeding per order of Dr. Marin Olp.

## 2020-10-17 ENCOUNTER — Ambulatory Visit
Admission: RE | Admit: 2020-10-17 | Discharge: 2020-10-17 | Disposition: A | Discharge status: Home / Self Care | Payer: BC Managed Care – PPO | Source: Ambulatory Visit | Attending: Radiation Oncology | Admitting: Radiation Oncology

## 2020-10-17 DIAGNOSIS — Z510 Encounter for antineoplastic radiation therapy: Secondary | ICD-10-CM | POA: Diagnosis not present

## 2020-10-20 ENCOUNTER — Ambulatory Visit
Admission: RE | Admit: 2020-10-20 | Discharge: 2020-10-20 | Disposition: A | Discharge status: Home / Self Care | Payer: BC Managed Care – PPO | Source: Ambulatory Visit | Attending: Radiation Oncology | Admitting: Radiation Oncology

## 2020-10-20 ENCOUNTER — Encounter: Payer: Self-pay | Admitting: *Deleted

## 2020-10-20 DIAGNOSIS — Z510 Encounter for antineoplastic radiation therapy: Secondary | ICD-10-CM | POA: Diagnosis not present

## 2020-10-21 ENCOUNTER — Encounter: Payer: Self-pay | Admitting: *Deleted

## 2020-10-21 ENCOUNTER — Ambulatory Visit
Admission: RE | Admit: 2020-10-21 | Discharge: 2020-10-21 | Disposition: A | Discharge status: Home / Self Care | Payer: BC Managed Care – PPO | Source: Ambulatory Visit | Attending: Radiation Oncology | Admitting: Radiation Oncology

## 2020-10-21 DIAGNOSIS — Z510 Encounter for antineoplastic radiation therapy: Secondary | ICD-10-CM | POA: Diagnosis not present

## 2020-10-21 NOTE — Progress Notes (Signed)
Patient's thyroid biopsy not yet scheduled. Called and spoke to [place] in scheduling. She stated that the clearance regarding the patient's Xarelto could not be accepted as it was in the chart. It cannot be a transcribed note, but must be written by the provider. Request made of Laverna Peace NP to place clearance note into the chart.   Patient updated to the delay.   Patient has also sent FMLA papers that need adjustment for his radiation therapy. Printed and given to Laverna Peace NP for completion.   Oncology Nurse Navigator Documentation  Oncology Nurse Navigator Flowsheets 10/21/2020  Abnormal Finding Date -  Confirmed Diagnosis Date -  Diagnosis Status -  Planned Course of Treatment -  Phase of Treatment -  Chemotherapy Actual Start Date: -  Radiation Pending-Reason: -  Radiation Actual Start Date: -  Radiation Expected End Date: -  Navigator Follow Up Date: -  Navigator Follow Up Reason: -  Production assistant, radio Encounter Type Appt/Treatment Plan Review;MyChart;Telephone  Telephone Outgoing Call;Asess Navigation Needs  Treatment Initiated Date -  Patient Visit Type MedOnc  Treatment Phase Active Tx  Barriers/Navigation Needs Coordination of Care;Education  Education Other  Interventions Coordination of Care;Education;Psycho-Social Support;Disability/FMLA  Acuity Level 2-Minimal Needs (1-2 Barriers Identified)  Coordination of Care Other  Education Method Written  Support Groups/Services Friends and Family  Time Spent with Patient 86

## 2020-10-22 ENCOUNTER — Encounter: Payer: Self-pay | Admitting: *Deleted

## 2020-10-22 ENCOUNTER — Inpatient Hospital Stay: Payer: BC Managed Care – PPO | Attending: Radiation Oncology

## 2020-10-22 ENCOUNTER — Ambulatory Visit
Admission: RE | Admit: 2020-10-22 | Discharge: 2020-10-22 | Disposition: A | Discharge status: Home / Self Care | Payer: BC Managed Care – PPO | Source: Ambulatory Visit | Attending: Radiation Oncology | Admitting: Radiation Oncology

## 2020-10-22 ENCOUNTER — Encounter: Payer: Self-pay | Admitting: Family

## 2020-10-22 ENCOUNTER — Other Ambulatory Visit: Payer: Self-pay

## 2020-10-22 DIAGNOSIS — C8332 Diffuse large B-cell lymphoma, intrathoracic lymph nodes: Secondary | ICD-10-CM

## 2020-10-22 DIAGNOSIS — C8520 Mediastinal (thymic) large B-cell lymphoma, unspecified site: Secondary | ICD-10-CM | POA: Insufficient documentation

## 2020-10-22 DIAGNOSIS — Z510 Encounter for antineoplastic radiation therapy: Secondary | ICD-10-CM | POA: Diagnosis not present

## 2020-10-22 LAB — CBC
HCT: 37.2 % — ABNORMAL LOW (ref 39.0–52.0)
Hemoglobin: 12.2 g/dL — ABNORMAL LOW (ref 13.0–17.0)
MCH: 30.9 pg (ref 26.0–34.0)
MCHC: 32.8 g/dL (ref 30.0–36.0)
MCV: 94.2 fL (ref 80.0–100.0)
Platelets: 264 10*3/uL (ref 150–400)
RBC: 3.95 MIL/uL — ABNORMAL LOW (ref 4.22–5.81)
RDW: 14.8 % (ref 11.5–15.5)
WBC: 3.3 10*3/uL — ABNORMAL LOW (ref 4.0–10.5)
nRBC: 0 % (ref 0.0–0.2)

## 2020-10-22 NOTE — Progress Notes (Signed)
Medical clearance letter signed by Dr. Marin Olp for pt to hold Xarelto for two days prior to thyroid biopsy and restart the day after biopsy if there is no bleeding faxed to [facility] at [phone] per [facility] request.

## 2020-10-22 NOTE — Progress Notes (Signed)
Patient is now scheduled for thyroid biopsy on 10/29/2020.  FMLA paperwork is being completed by the NP and will be ready for the patient by end of day tomorrow. Patient updated.   Oncology Nurse Navigator Documentation  Oncology Nurse Navigator Flowsheets 10/22/2020  Abnormal Finding Date -  Confirmed Diagnosis Date -  Diagnosis Status -  Planned Course of Treatment -  Phase of Treatment -  Chemotherapy Actual Start Date: -  Radiation Pending-Reason: -  Radiation Actual Start Date: -  Radiation Expected End Date: -  Navigator Follow Up Date: 10/29/2020  Navigator Follow Up Reason: Other:  Navigator Location [facility]  Navigator Encounter Type Appt/Treatment Plan Review;MyChart  Telephone -  Treatment Initiated Date -  Patient Visit Type MedOnc  Treatment Phase Active Tx  Barriers/Navigation Needs Coordination of Care;Education  Education -  Interventions Coordination of Care;Disability/FMLA  Acuity Level 2-Minimal Needs (1-2 Barriers Identified)  Coordination of Care Other  Education Method -  Support Groups/Services Friends and Family  Time Spent with Patient 30

## 2020-10-23 ENCOUNTER — Encounter: Payer: Self-pay | Admitting: *Deleted

## 2020-10-23 ENCOUNTER — Ambulatory Visit
Admission: RE | Admit: 2020-10-23 | Discharge: 2020-10-23 | Disposition: A | Discharge status: Home / Self Care | Payer: BC Managed Care – PPO | Source: Ambulatory Visit | Attending: Radiation Oncology | Admitting: Radiation Oncology

## 2020-10-23 DIAGNOSIS — Z510 Encounter for antineoplastic radiation therapy: Secondary | ICD-10-CM | POA: Diagnosis not present

## 2020-10-23 NOTE — Progress Notes (Signed)
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

## 2020-10-23 NOTE — Progress Notes (Signed)
Spoke with pt. : pt. Aware to hold Xarelto as of 3/27 PM. Reviewed with pt. NPO after MN 3/30, to report to medical mall, to have ride with him if he feels need for sedation, and to O.K. to take AM meds (except Xarelto) .Pt. verbalized understanding of all instructions for biopsy on 10/29/2020.

## 2020-10-24 ENCOUNTER — Ambulatory Visit
Admission: RE | Admit: 2020-10-24 | Discharge: 2020-10-24 | Disposition: A | Discharge status: Home / Self Care | Payer: BC Managed Care – PPO | Source: Ambulatory Visit | Attending: Radiation Oncology | Admitting: Radiation Oncology

## 2020-10-24 DIAGNOSIS — Z510 Encounter for antineoplastic radiation therapy: Secondary | ICD-10-CM | POA: Diagnosis not present

## 2020-10-27 ENCOUNTER — Ambulatory Visit
Admission: RE | Admit: 2020-10-27 | Discharge: 2020-10-27 | Disposition: A | Discharge status: Home / Self Care | Payer: BC Managed Care – PPO | Source: Ambulatory Visit | Attending: Radiation Oncology | Admitting: Radiation Oncology

## 2020-10-27 DIAGNOSIS — Z510 Encounter for antineoplastic radiation therapy: Secondary | ICD-10-CM | POA: Diagnosis not present

## 2020-10-27 NOTE — OR Nursing (Signed)
Called pt, pre procedure instructions. Do not need to hold Xarelto or fast for thyroid biopsy. (verified by Dr T. Shick)

## 2020-10-28 ENCOUNTER — Ambulatory Visit
Admission: RE | Admit: 2020-10-28 | Discharge: 2020-10-28 | Disposition: A | Discharge status: Home / Self Care | Payer: BC Managed Care – PPO | Source: Ambulatory Visit | Attending: Radiation Oncology | Admitting: Radiation Oncology

## 2020-10-28 ENCOUNTER — Other Ambulatory Visit: Payer: Self-pay | Admitting: *Deleted

## 2020-10-28 DIAGNOSIS — Z510 Encounter for antineoplastic radiation therapy: Secondary | ICD-10-CM | POA: Diagnosis not present

## 2020-10-28 MED ORDER — SUCRALFATE 1 G PO TABS: 1.0000 g | ORAL_TABLET | Freq: Three times a day (TID) | ORAL | 3 refills | Status: DC

## 2020-10-29 ENCOUNTER — Other Ambulatory Visit: Payer: Self-pay

## 2020-10-29 ENCOUNTER — Encounter: Payer: Self-pay | Admitting: *Deleted

## 2020-10-29 ENCOUNTER — Inpatient Hospital Stay: Payer: BC Managed Care – PPO

## 2020-10-29 ENCOUNTER — Ambulatory Visit
Admission: RE | Admit: 2020-10-29 | Discharge: 2020-10-29 | Disposition: A | Discharge status: Home / Self Care | Payer: BC Managed Care – PPO | Source: Ambulatory Visit | Attending: Hematology & Oncology | Admitting: Hematology & Oncology

## 2020-10-29 ENCOUNTER — Ambulatory Visit
Admission: RE | Admit: 2020-10-29 | Discharge: 2020-10-29 | Disposition: A | Discharge status: Home / Self Care | Payer: BC Managed Care – PPO | Source: Ambulatory Visit | Attending: Radiation Oncology | Admitting: Radiation Oncology

## 2020-10-29 DIAGNOSIS — C8520 Mediastinal (thymic) large B-cell lymphoma, unspecified site: Secondary | ICD-10-CM | POA: Diagnosis not present

## 2020-10-29 DIAGNOSIS — D440 Neoplasm of uncertain behavior of thyroid gland: Secondary | ICD-10-CM | POA: Diagnosis not present

## 2020-10-29 DIAGNOSIS — E042 Nontoxic multinodular goiter: Secondary | ICD-10-CM | POA: Diagnosis present

## 2020-10-29 DIAGNOSIS — Z510 Encounter for antineoplastic radiation therapy: Secondary | ICD-10-CM | POA: Diagnosis not present

## 2020-10-29 DIAGNOSIS — C8332 Diffuse large B-cell lymphoma, intrathoracic lymph nodes: Secondary | ICD-10-CM

## 2020-10-29 LAB — CBC
HCT: 40.5 % (ref 39.0–52.0)
Hemoglobin: 13 g/dL (ref 13.0–17.0)
MCH: 29.9 pg (ref 26.0–34.0)
MCHC: 32.1 g/dL (ref 30.0–36.0)
MCV: 93.1 fL (ref 80.0–100.0)
Platelets: 251 10*3/uL (ref 150–400)
RBC: 4.35 MIL/uL (ref 4.22–5.81)
RDW: 14.3 % (ref 11.5–15.5)
WBC: 3.9 10*3/uL — ABNORMAL LOW (ref 4.0–10.5)
nRBC: 0 % (ref 0.0–0.2)

## 2020-10-29 NOTE — Discharge Instructions (Signed)
Thyroid Needle Biopsy, Care After This sheet gives you information about how to care for yourself after your procedure. Your health care provider may also give you more specific instructions. If you have problems or questions, contact your health care provider. What can I expect after the procedure? After the procedure, it is common to have:  Soreness and tenderness that lasts for a few days.  Bruising where the needle was inserted (puncture site). Follow these instructions at home:  Take over-the-counter and prescription medicines only as told by your health care provider.  To help ease discomfort, keep your head raised (elevated) when you are lying down. When you move from lying down to sitting up, use both hands to support the back of your head and neck.  Check your puncture site every day for signs of infection. Check for: ? Redness, swelling, or pain. ? Fluid or blood. ? Warmth. ? Pus or a bad smell.  Return to your normal activities as told by your health care provider. Ask your health care provider what activities are safe for you.  Keep all follow-up visits as told by your health care provider. This is important.   Contact a health care provider if:  You have redness, swelling, or pain around your puncture site.  You have fluid or blood coming from your puncture site.  Your puncture site feels warm to the touch.  You have pus or a bad smell coming from your puncture site.  You have a fever. Get help right away if:  You have severe bleeding from the puncture site.  You have difficulty swallowing.  You have swollen glands (lymph nodes) in your neck. Summary  It is common to have some bruising and soreness where the needle was inserted in your lower front neck area (puncture site).  Check your puncture site every day for signs of infection, such as redness, swelling, or pain.  Get help right away if you have severe bleeding from your puncture site. This  information is not intended to replace advice given to you by your health care provider. Make sure you discuss any questions you have with your health care provider. Document Revised: 03/27/2020 Document Reviewed: 03/27/2020 Elsevier Patient Education  2021 Elsevier Inc.  

## 2020-10-29 NOTE — Progress Notes (Signed)
Oncology Nurse Navigator Documentation  Oncology Nurse Navigator Flowsheets 10/29/2020  Abnormal Finding Date -  Confirmed Diagnosis Date -  Diagnosis Status -  Planned Course of Treatment -  Phase of Treatment -  Chemotherapy Actual Start Date: -  Radiation Pending-Reason: -  Radiation Actual Start Date: -  Radiation Expected End Date: -  Navigator Follow Up Date: 11/04/2020  Navigator Follow Up Reason: Pathology  Navigator Location [facility]  Navigator Encounter Type Appt/Treatment Plan Review  Telephone -  Treatment Initiated Date -  Patient Visit Type MedOnc  Treatment Phase Active Tx  Barriers/Navigation Needs Coordination of Care;Education  Education -  Interventions None Required  Acuity Level 2-Minimal Needs (1-2 Barriers Identified)  Coordination of Care -  Education Method -  Support Groups/Services Friends and Family  Time Spent with Patient 15

## 2020-10-30 ENCOUNTER — Ambulatory Visit
Admission: RE | Admit: 2020-10-30 | Discharge: 2020-10-30 | Disposition: A | Discharge status: Home / Self Care | Payer: BC Managed Care – PPO | Source: Ambulatory Visit | Attending: Radiation Oncology | Admitting: Radiation Oncology

## 2020-10-30 DIAGNOSIS — Z510 Encounter for antineoplastic radiation therapy: Secondary | ICD-10-CM | POA: Diagnosis not present

## 2020-10-31 ENCOUNTER — Ambulatory Visit
Admission: RE | Admit: 2020-10-31 | Discharge: 2020-10-31 | Disposition: A | Discharge status: Home / Self Care | Payer: BC Managed Care – PPO | Source: Ambulatory Visit | Attending: Radiation Oncology | Admitting: Radiation Oncology

## 2020-10-31 DIAGNOSIS — C8332 Diffuse large B-cell lymphoma, intrathoracic lymph nodes: Secondary | ICD-10-CM | POA: Diagnosis present

## 2020-10-31 DIAGNOSIS — Z510 Encounter for antineoplastic radiation therapy: Secondary | ICD-10-CM | POA: Diagnosis present

## 2020-11-03 ENCOUNTER — Ambulatory Visit
Admission: RE | Admit: 2020-11-03 | Discharge: 2020-11-03 | Disposition: A | Discharge status: Home / Self Care | Payer: BC Managed Care – PPO | Source: Ambulatory Visit | Attending: Radiation Oncology | Admitting: Radiation Oncology

## 2020-11-03 DIAGNOSIS — Z510 Encounter for antineoplastic radiation therapy: Secondary | ICD-10-CM | POA: Diagnosis not present

## 2020-11-04 ENCOUNTER — Ambulatory Visit
Admission: RE | Admit: 2020-11-04 | Discharge: 2020-11-04 | Disposition: A | Discharge status: Home / Self Care | Payer: BC Managed Care – PPO | Source: Ambulatory Visit | Attending: Radiation Oncology | Admitting: Radiation Oncology

## 2020-11-04 DIAGNOSIS — Z510 Encounter for antineoplastic radiation therapy: Secondary | ICD-10-CM | POA: Diagnosis not present

## 2020-11-05 ENCOUNTER — Inpatient Hospital Stay: Payer: BC Managed Care – PPO | Attending: Hematology & Oncology

## 2020-11-05 ENCOUNTER — Other Ambulatory Visit: Payer: Self-pay

## 2020-11-05 ENCOUNTER — Inpatient Hospital Stay: Payer: BC Managed Care – PPO

## 2020-11-05 ENCOUNTER — Telehealth: Payer: Self-pay | Admitting: *Deleted

## 2020-11-05 ENCOUNTER — Ambulatory Visit
Admission: RE | Admit: 2020-11-05 | Discharge: 2020-11-05 | Disposition: A | Discharge status: Home / Self Care | Payer: BC Managed Care – PPO | Source: Ambulatory Visit | Attending: Radiation Oncology | Admitting: Radiation Oncology

## 2020-11-05 ENCOUNTER — Encounter: Payer: Self-pay | Admitting: *Deleted

## 2020-11-05 ENCOUNTER — Encounter: Payer: Self-pay | Admitting: Hematology & Oncology

## 2020-11-05 ENCOUNTER — Inpatient Hospital Stay (HOSPITAL_BASED_OUTPATIENT_CLINIC_OR_DEPARTMENT_OTHER): Payer: BC Managed Care – PPO | Admitting: Hematology & Oncology

## 2020-11-05 VITALS — BP 109/80 | HR 86 | Temp 98.3°F | Resp 20 | Wt 205.0 lb

## 2020-11-05 DIAGNOSIS — Z7901 Long term (current) use of anticoagulants: Secondary | ICD-10-CM | POA: Insufficient documentation

## 2020-11-05 DIAGNOSIS — I2699 Other pulmonary embolism without acute cor pulmonale: Secondary | ICD-10-CM | POA: Insufficient documentation

## 2020-11-05 DIAGNOSIS — C8582 Other specified types of non-Hodgkin lymphoma, intrathoracic lymph nodes: Secondary | ICD-10-CM

## 2020-11-05 DIAGNOSIS — J910 Malignant pleural effusion: Secondary | ICD-10-CM | POA: Insufficient documentation

## 2020-11-05 DIAGNOSIS — Z510 Encounter for antineoplastic radiation therapy: Secondary | ICD-10-CM | POA: Diagnosis not present

## 2020-11-05 DIAGNOSIS — C8522 Mediastinal (thymic) large B-cell lymphoma, intrathoracic lymph nodes: Secondary | ICD-10-CM | POA: Insufficient documentation

## 2020-11-05 DIAGNOSIS — I2782 Chronic pulmonary embolism: Secondary | ICD-10-CM

## 2020-11-05 DIAGNOSIS — Z95828 Presence of other vascular implants and grafts: Secondary | ICD-10-CM

## 2020-11-05 DIAGNOSIS — I2692 Saddle embolus of pulmonary artery without acute cor pulmonale: Secondary | ICD-10-CM

## 2020-11-05 LAB — CBC WITH DIFFERENTIAL (CANCER CENTER ONLY)
Abs Immature Granulocytes: 0.01 10*3/uL (ref 0.00–0.07)
Basophils Absolute: 0 10*3/uL (ref 0.0–0.1)
Basophils Relative: 0 %
Eosinophils Absolute: 0.1 10*3/uL (ref 0.0–0.5)
Eosinophils Relative: 2 %
HCT: 40.2 % (ref 39.0–52.0)
Hemoglobin: 13.4 g/dL (ref 13.0–17.0)
Immature Granulocytes: 0 %
Lymphocytes Relative: 10 %
Lymphs Abs: 0.4 10*3/uL — ABNORMAL LOW (ref 0.7–4.0)
MCH: 30 pg (ref 26.0–34.0)
MCHC: 33.3 g/dL (ref 30.0–36.0)
MCV: 89.9 fL (ref 80.0–100.0)
Monocytes Absolute: 0.5 10*3/uL (ref 0.1–1.0)
Monocytes Relative: 12 %
Neutro Abs: 3 10*3/uL (ref 1.7–7.7)
Neutrophils Relative %: 76 %
Platelet Count: 281 10*3/uL (ref 150–400)
RBC: 4.47 MIL/uL (ref 4.22–5.81)
RDW: 14.3 % (ref 11.5–15.5)
WBC Count: 3.9 10*3/uL — ABNORMAL LOW (ref 4.0–10.5)
nRBC: 0 % (ref 0.0–0.2)

## 2020-11-05 LAB — CMP (CANCER CENTER ONLY)
ALT: 20 U/L (ref 0–44)
AST: 17 U/L (ref 15–41)
Albumin: 4.3 g/dL (ref 3.5–5.0)
Alkaline Phosphatase: 100 U/L (ref 38–126)
Anion gap: 6 (ref 5–15)
BUN: 12 mg/dL (ref 6–20)
CO2: 29 mmol/L (ref 22–32)
Calcium: 9.6 mg/dL (ref 8.9–10.3)
Chloride: 106 mmol/L (ref 98–111)
Creatinine: 1.17 mg/dL (ref 0.61–1.24)
GFR, Estimated: >60 mL/min (ref >60)
Glucose, Bld: 82 mg/dL (ref 70–99)
Potassium: 4 mmol/L (ref 3.5–5.1)
Sodium: 141 mmol/L (ref 135–145)
Total Bilirubin: 0.4 mg/dL (ref 0.3–1.2)
Total Protein: 6.2 g/dL — ABNORMAL LOW (ref 6.5–8.1)

## 2020-11-05 LAB — D-DIMER, QUANTITATIVE: D-Dimer, Quant: 0.36 ug/mL-FEU (ref 0.00–0.50)

## 2020-11-05 LAB — LACTATE DEHYDROGENASE: LDH: 138 U/L (ref 98–192)

## 2020-11-05 MED ORDER — HEPARIN SOD (PORK) LOCK FLUSH 100 UNIT/ML IV SOLN
500.0000 [IU] | Freq: Once | INTRAVENOUS | Status: AC
  Administered 2020-11-05: 500 [IU] via INTRAVENOUS
  Filled 2020-11-05: qty 5

## 2020-11-05 MED ORDER — SODIUM CHLORIDE 0.9% FLUSH
10.0000 mL | Freq: Once | INTRAVENOUS | Status: AC
  Administered 2020-11-05: 10 mL via INTRAVENOUS
  Filled 2020-11-05: qty 10

## 2020-11-05 NOTE — Patient Instructions (Signed)
Tunneled Central Venous Catheter Flushing Guide  It is important to flush your tunneled central venous catheter each time you use it, both before and after you use it. Flushing your catheter will help prevent it from clogging. What are the risks? Risks may include:  Infection.  Air getting into the catheter and bloodstream. Supplies needed:  A clean pair of gloves.  A disinfecting wipe. Use an alcohol wipe, chlorhexidine wipe, or iodine wipe as told by your health care provider.  A 10 mL syringe that has been prefilled with saline solution.  An empty 10 mL syringe, if a substance called heparin was injected into your catheter. How to flush your catheter When you flush your catheter, make sure you follow any specific instructions from your health care provider or the manufacturer. These are general guidelines. Flushing your catheter before use If there is heparin in your catheter: 1. Wash your hands with soap and water. 2. Put on gloves. 3. Scrub the injection cap for a minimum of 15 seconds with a disinfecting wipe. 4. Unclamp the catheter. 5. Attach the empty syringe to the injection cap. 6. Pull the syringe plunger back and withdraw 10 mL of blood. 7. Place the syringe into an appropriate waste container. 8. Scrub the injection cap for 15 seconds with a disinfecting wipe. 9. Attach the prefilled syringe to the injection cap. 10. Flush the catheter by pushing the plunger forward until all the liquid from the syringe is in the catheter. 11. Remove the syringe from the injection cap. 12. Clamp the catheter. If there is no heparin in your catheter: 1. Wash your hands with soap and water. 2. Put on gloves. 3. Scrub the injection cap for 15 seconds with a disinfecting wipe. 4. Unclamp the catheter. 5. Attach the prefilled syringe to the injection cap. 6. Flush the catheter by pushing the plunger forward until 5 mL of the liquid from the syringe is in the catheter. 7. Pull back on  the syringe until you see blood in the catheter. 8. If you have been asked to collect any blood, follow your health care provider's instructions. Otherwise, flush the catheter with the rest of the solution from the syringe. 9. Remove the syringe from the injection cap. 10. Clamp the catheter.   Flushing your catheter after use 1. Wash your hands with soap and water. 2. Put on gloves. 3. Scrub the injection cap for 15 seconds with a disinfecting wipe. 4. Unclamp the catheter. 5. Attach the prefilled syringe to the injection cap. 6. Flush the catheter by pushing the plunger forward until all of the liquid from the syringe is in the catheter. 7. Remove the syringe from the injection cap. 8. Clamp the catheter. Problems and solutions  If blood cannot be completely cleared from the injection cap, you may need to have the injection cap replaced.  If the catheter is difficult to flush, use the pulsing method. The pulsing method involves pushing only a few milliliters of solution into the catheter at a time and pausing between pushes.  If you do not see blood in the catheter when you pull back on the syringe, change your body position, such as by raising your arms above your head. Take a deep breath and cough. Then, pull back on the syringe. If you still do not see blood, flush the catheter with a small amount of solution. Then, change positions again and take a breath or cough. Pull back on the syringe again. If you still do not   see blood, finish flushing the catheter and contact your health care provider. Do not use your catheter until your health care provider says it is okay. General tips  Have someone help you flush your catheter, if possible.  Do not force fluid through your catheter.  Do not use a syringe that is larger or smaller than 10 mL. Using a smaller syringe can make the catheter burst.  Do not use your catheter without flushing it first if it has heparin in it. Contact a health  care provider if:  You cannot see any blood in the catheter when you flush it before using it.  Your catheter is difficult to flush. Get help right away if:  You cannot flush the catheter.  The catheter leaks when you flush it or when there is fluid in it.  There are cracks or breaks in the catheter. Summary  It is important to flush your tunneled central venous catheter each time you use it, both before and after you use it.  Scrub the injection cap for 15 seconds with a disinfecting wipe before and after you flush it.  When you flush your catheter, make sure you follow any specific instructions from your health care provider or the manufacturer.  Get help right away if you cannot flush the catheter. This information is not intended to replace advice given to you by your health care provider. Make sure you discuss any questions you have with your health care provider. Document Revised: 09/27/2019 Document Reviewed: 10/04/2018 Elsevier Patient Education  2021 Elsevier Inc.  

## 2020-11-05 NOTE — Telephone Encounter (Signed)
Per 11/05/20 los gave patient upcoming appointments

## 2020-11-05 NOTE — Progress Notes (Signed)
Hematology and Oncology Follow Up Visit  Travis Duran [id number] 08-10-1975 45 y.o. 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 daily --started on 07/02/2020  Consolidative radiation therapy to the residual mediastinal mass-to finish radiation therapy on 11/06/2020     Interim History:  Travis Duran is back for follow-up.  He will finish up his radiation therapy to the residual mediastinal mass tomorrow.  He is on well with radiation therapy.  He has had no problems with cough or shortness of breath.  He has little bit of odynophagia but is able to eat well.  He is more active.  He is walking and running.  He has had no problems with nausea or vomiting.  He was found to have a thyroid nodule on a PET scan.  He had this nodule biopsied.  We are not sure if this is malignant as of yet.  He has had no problems with leg swelling.  He is on Xarelto.  He is on 20 mg a day.  We will have to follow him up with a CT angiogram to assess for resolution of the pulmonary emboli.  He has had no problems with headache.  He is working and quite busy.  His overall performance status is ECOG 0.    Medications:  Current Outpatient Medications:  .  famciclovir (FAMVIR) 500 MG tablet, Take 1 tablet (500 mg total) by mouth daily., Disp: 30 tablet, Rfl: 5 .  Melatonin 3 MG CAPS, Take 3 mg by mouth at bedtime., Disp: , Rfl:  .  rivaroxaban (XARELTO) 20 MG TABS tablet, Take 1 tablet (20 mg total) by mouth daily with supper., Disp: 30 tablet, Rfl: 6 .  tadalafil (CIALIS) 10 MG tablet, Take 1 tablet (10 mg total) by mouth daily as needed for erectile dysfunction., Disp: 10 tablet, Rfl: 11 .  magic mouthwash SOLN, Take 5 mLs by mouth 4 (four) times daily as needed for mouth pain. (Patient not taking: No sig reported), Disp: 480 mL, Rfl: 6 .   ondansetron (ZOFRAN) 4 MG tablet, Take 1-2 tablets (4-8 mg total) by mouth every 8 (eight) hours as needed for nausea (not responsive to prochlorperazine (COMPAZINE)). (Patient not taking: Reported on 09/29/2020), Disp: 20 tablet, Rfl: 0 .  prochlorperazine (COMPAZINE) 10 MG tablet, Take 1 tablet (10 mg total) by mouth every 6 (six) hours as needed for nausea or vomiting. (Patient not taking: No sig reported), Disp: 30 tablet, Rfl: 0  Allergies: No Known Allergies  Past Medical History, Surgical history, Social history, and Family History were reviewed and updated.  Review of Systems: Review of Systems  Constitutional: Negative for unexpected weight change.  HENT:   Negative for mouth sores.   Eyes: Negative.   Respiratory: Positive for chest tightness, cough and shortness of breath.   Cardiovascular: Negative.   Gastrointestinal: Negative.   Endocrine: Negative.   Genitourinary: Negative.    Musculoskeletal: Negative.   Skin: Negative.   Neurological: Negative.   Hematological: Negative.   Psychiatric/Behavioral: Negative.     Physical Exam:  weight is 205 lb 0.6 oz (93 kg). His oral temperature is 98.3 F (36.8 C). His blood pressure is 109/80 and his pulse is 86. His respiration is 20 and oxygen saturation is 100%.   Wt Readings from Last 3 Encounters:  11/05/20 205 lb 0.6 oz (  93 kg)  09/24/20 204 lb (92.5 kg)  08/25/20 190 lb (86.2 kg)    Physical Exam Vitals reviewed.  HENT:     Head: Normocephalic and atraumatic.  Eyes:     Pupils: Pupils are equal, round, and reactive to light.  Cardiovascular:     Rate and Rhythm: Regular rhythm. Tachycardia present.     Heart sounds: Normal heart sounds.     Comments: Cardiac exam is regular rate and rhythm.  There are no murmurs, rubs or bruits.   Pulmonary:     Comments: Lungs are clear bilaterally.  He has good air movement bilaterally.  There are no rales, wheezes or rhonchi.    Abdominal:     General: Bowel sounds are  normal.     Palpations: Abdomen is soft.  Musculoskeletal:        General: No tenderness or deformity. Normal range of motion.     Cervical back: Normal range of motion.  Lymphadenopathy:     Cervical: No cervical adenopathy.  Skin:    General: Skin is warm and dry.     Findings: No erythema or rash.  Neurological:     Mental Status: He is alert and oriented to person, place, and time.  Psychiatric:        Behavior: Behavior normal.        Thought Content: Thought content normal.        Judgment: Judgment normal.    Lab Results  Component Value Date   WBC 3.9 (L) 11/05/2020   HGB 13.4 11/05/2020   HCT 40.2 11/05/2020   MCV 89.9 11/05/2020   PLT 281 11/05/2020     Chemistry      Component Value Date/Time   NA 141 11/05/2020 1117   K 4.0 11/05/2020 1117   CL 106 11/05/2020 1117   CO2 29 11/05/2020 1117   BUN 12 11/05/2020 1117   CREATININE 1.17 11/05/2020 1117      Component Value Date/Time   CALCIUM 9.6 11/05/2020 1117   ALKPHOS 100 11/05/2020 1117   AST 17 11/05/2020 1117   ALT 20 11/05/2020 1117   BILITOT 0.4 11/05/2020 1117      Impression and Plan: Travis Duran is a really nice 45 year old white male.  He presented with a massive mediastinal tumor.  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  This is a B-cell lymphoma.  We treated him with 6 cycles of chemotherapy with infusional R-EPOCH.  He did well with this.  Had a good response.  We then went ahead and gave him some consolidative radiation therapy because he had such a large mediastinal mass.  We will plan for a follow-up PET scan probably in about 6 weeks.  I will likely do this maybe after Memorial Day.  We also are going to have to do another CT angiogram on him.  This will be to assess for the pulmonary emboli.  I am just happy that he has responded.  He looked in rough shape only for saw him.  He, to no surprise, has showed what he is all  about.  He has a lot of toughness.  He is incredibly motivated.  He has a wonderful wife who is so supportive.  I will plan to get him back in 2 months.  By then, we would have had the PET scan.    Volanda Napoleon, MD 4/6/202212:49 PM

## 2020-11-05 NOTE — Progress Notes (Signed)
Patient here after his final radiation treatment this morning. He is very relieved to be done with radiation. Final pathology on thyroid biopsy is still not back. He knows that we will call him with results as soon as they are posted. He is understandably anxious for these.   Oncology Nurse Navigator Documentation  Oncology Nurse Navigator Flowsheets 11/05/2020  Abnormal Finding Date -  Confirmed Diagnosis Date -  Diagnosis Status -  Planned Course of Treatment -  Phase of Treatment Radiation  Chemotherapy Actual Start Date: -  Radiation Pending-Reason: -  Radiation Actual Start Date: -  Radiation Expected End Date: -  Radiation Actual End Date: 11/05/2020  Navigator Follow Up Date: 01/05/2021  Navigator Follow Up Reason: Follow-up Appointment  Navigator Location [facility]  Navigator Encounter Type Follow-up Appt;Pathology Review  Telephone -  Treatment Initiated Date -  Patient Visit Type MedOnc  Treatment Phase Final Radiation Tx  Barriers/Navigation Needs Coordination of Care;Education  Education Other  Interventions Education;Psycho-Social Support  Acuity Level 2-Minimal Needs (1-2 Barriers Identified)  Coordination of Care -  Education Method Verbal  Support Groups/Services Friends and Family  Time Spent with Patient 30

## 2020-11-08 ENCOUNTER — Other Ambulatory Visit: Payer: Self-pay | Admitting: Hematology & Oncology

## 2020-11-08 DIAGNOSIS — C8330 Diffuse large B-cell lymphoma, unspecified site: Secondary | ICD-10-CM

## 2020-11-12 ENCOUNTER — Encounter: Payer: Self-pay | Admitting: *Deleted

## 2020-11-12 NOTE — Progress Notes (Signed)
Oncology Nurse Navigator Documentation  Oncology Nurse Navigator Flowsheets 11/12/2020  Abnormal Finding Date -  Confirmed Diagnosis Date -  Diagnosis Status -  Planned Course of Treatment -  Phase of Treatment -  Chemotherapy Actual Start Date: -  Radiation Pending-Reason: -  Radiation Actual Start Date: -  Radiation Expected End Date: -  Radiation Actual End Date: -  Navigator Follow Up Date: 12/30/2020  Navigator Follow Up Reason: Scan Review  Navigator Location [facility]  Navigator Encounter Type MyChart;Appt/Treatment Plan Review  Telephone -  Treatment Initiated Date -  Patient Visit Type -  Treatment Phase Post-Tx Follow-up  Barriers/Navigation Needs Coordination of Care;Education  Education Other  Interventions Coordination of Care;Education  Acuity Level 2-Minimal Needs (1-2 Barriers Identified)  Coordination of Care Appts  Education Method Written  Support Groups/Services Friends and Family  Time Spent with Patient 30

## 2020-11-17 ENCOUNTER — Encounter: Payer: Self-pay | Admitting: Hematology & Oncology

## 2020-11-17 LAB — CYTOLOGY - NON PAP

## 2020-12-05 ENCOUNTER — Ambulatory Visit
Admission: RE | Admit: 2020-12-05 | Discharge: 2020-12-05 | Disposition: A | Discharge status: Home / Self Care | Payer: BC Managed Care – PPO | Source: Ambulatory Visit | Attending: Radiation Oncology | Admitting: Radiation Oncology

## 2020-12-05 ENCOUNTER — Encounter: Payer: Self-pay | Admitting: Radiation Oncology

## 2020-12-05 VITALS — BP 114/89 | HR 75 | Temp 96.3°F | Wt 208.0 lb

## 2020-12-05 DIAGNOSIS — C8332 Diffuse large B-cell lymphoma, intrathoracic lymph nodes: Secondary | ICD-10-CM

## 2020-12-05 NOTE — Progress Notes (Signed)
Radiation Oncology Follow up Note  Name: Travis Duran   Date:   12/05/2020 MRN:  [id number] DOB: [birth year]    This 45 y.o. male presents to the clinic today for 1 month follow-up status post involved field radiation therapy for mediastinal large B-cell lymphoma status post 6 cycles of R-EPOCH with excellent response.  REFERRING PROVIDER: Laurey Morale, MD  HPI: Patient is a 45 year old male now at 1 month having completed involved field radiation therapy to his mediastinum in a patient with large B-cell lymphoma with positive pleural effusion status post 6 cycles of R-EPOCH with excellent response seen today in routine follow-up he is doing well.  He specifically denies cough dysphagia or fatigue.  He is also having no fever chills or night sweats.  He is currently under observation has a PET/CT scheduled for the end of May.  COMPLICATIONS OF TREATMENT: none  FOLLOW UP COMPLIANCE: keeps appointments   PHYSICAL EXAM:  BP 114/89   Pulse 75   Temp (!) 96.3 F (35.7 C) (Tympanic)   Wt 208 lb (94.3 kg)   BMI 30.72 kg/m  Well-developed well-nourished patient in NAD. HEENT reveals PERLA, EOMI, discs not visualized.  Oral cavity is clear. No oral mucosal lesions are identified. Neck is clear without evidence of cervical or supraclavicular adenopathy. Lungs are clear to A&P. Cardiac examination is essentially unremarkable with regular rate and rhythm without murmur rub or thrill. Abdomen is benign with no organomegaly or masses noted. Motor sensory and DTR levels are equal and symmetric in the upper and lower extremities. Cranial nerves II through XII are grossly intact. Proprioception is intact. No peripheral adenopathy or edema is identified. No motor or sensory levels are noted. Crude visual fields are within normal range.  RADIOLOGY RESULTS: PET CT scan will be reviewed when available  PLAN: Present time patient is doing well no significant side effects from recent radiation therapy and  pleased with his overall progress.  Of asked to see him back in 4 to 5 months for follow-up.  Of asked him to copy me his PET CT scan report.  Patient knows to call with any concerns.  He continues close follow-up care with medical oncology.  I would like to take this opportunity to thank you for allowing me to participate in the care of your patient.Noreene Filbert, MD

## 2020-12-25 ENCOUNTER — Encounter: Payer: Self-pay | Admitting: *Deleted

## 2020-12-25 ENCOUNTER — Telehealth: Payer: Self-pay

## 2020-12-25 NOTE — Telephone Encounter (Signed)
Called pt per sch message and r/s his appt to follow the PET scan, pt aware  Dorrene Bently

## 2020-12-30 ENCOUNTER — Ambulatory Visit: Payer: BC Managed Care – PPO

## 2021-01-05 ENCOUNTER — Inpatient Hospital Stay: Payer: BC Managed Care – PPO

## 2021-01-05 ENCOUNTER — Inpatient Hospital Stay: Payer: BC Managed Care – PPO | Admitting: Hematology & Oncology

## 2021-01-06 ENCOUNTER — Ambulatory Visit
Admission: RE | Admit: 2021-01-06 | Discharge: 2021-01-06 | Disposition: A | Discharge status: Home / Self Care | Payer: BC Managed Care – PPO | Source: Ambulatory Visit | Attending: Hematology & Oncology | Admitting: Hematology & Oncology

## 2021-01-06 ENCOUNTER — Other Ambulatory Visit: Payer: Self-pay

## 2021-01-06 ENCOUNTER — Encounter: Payer: Self-pay | Admitting: *Deleted

## 2021-01-06 DIAGNOSIS — C8582 Other specified types of non-Hodgkin lymphoma, intrathoracic lymph nodes: Secondary | ICD-10-CM | POA: Insufficient documentation

## 2021-01-06 DIAGNOSIS — I700 Atherosclerosis of aorta: Secondary | ICD-10-CM | POA: Insufficient documentation

## 2021-01-06 DIAGNOSIS — Z923 Personal history of irradiation: Secondary | ICD-10-CM | POA: Diagnosis not present

## 2021-01-06 LAB — GLUCOSE, CAPILLARY: Glucose-Capillary: 74 mg/dL (ref 70–99)

## 2021-01-06 MED ORDER — FLUDEOXYGLUCOSE F - 18 (FDG) INJECTION
10.8000 | Freq: Once | INTRAVENOUS | Status: AC | PRN
  Administered 2021-01-06: 10.9 via INTRAVENOUS

## 2021-01-06 NOTE — Progress Notes (Signed)
Oncology Nurse Navigator Documentation  Oncology Nurse Navigator Flowsheets 01/06/2021  Abnormal Finding Date -  Confirmed Diagnosis Date -  Diagnosis Status -  Planned Course of Treatment -  Phase of Treatment -  Chemotherapy Actual Start Date: -  Radiation Pending-Reason: -  Radiation Actual Start Date: -  Radiation Expected End Date: -  Radiation Actual End Date: -  Navigator Follow Up Date: 01/08/2021  Navigator Follow Up Reason: Follow-up Appointment  Navigator Location [facility]  Navigator Encounter Type Scan Review  Telephone -  Treatment Initiated Date -  Patient Visit Type MedOnc  Treatment Phase Post-Tx Follow-up  Barriers/Navigation Needs Coordination of Care;Education  Education -  Interventions None Required  Acuity Level 2-Minimal Needs (1-2 Barriers Identified)  Coordination of Care -  Education Method -  Support Groups/Services Friends and Family  Time Spent with Patient 15

## 2021-01-08 ENCOUNTER — Encounter: Payer: Self-pay | Admitting: Hematology & Oncology

## 2021-01-08 ENCOUNTER — Inpatient Hospital Stay: Payer: BC Managed Care – PPO

## 2021-01-08 ENCOUNTER — Encounter: Payer: Self-pay | Admitting: *Deleted

## 2021-01-08 ENCOUNTER — Inpatient Hospital Stay: Payer: BC Managed Care – PPO | Attending: Hematology & Oncology

## 2021-01-08 ENCOUNTER — Inpatient Hospital Stay (HOSPITAL_BASED_OUTPATIENT_CLINIC_OR_DEPARTMENT_OTHER): Payer: BC Managed Care – PPO | Admitting: Hematology & Oncology

## 2021-01-08 ENCOUNTER — Telehealth: Payer: Self-pay

## 2021-01-08 ENCOUNTER — Other Ambulatory Visit: Payer: Self-pay

## 2021-01-08 VITALS — BP 110/67 | HR 86 | Temp 98.0°F | Resp 20 | Wt 208.0 lb

## 2021-01-08 DIAGNOSIS — J910 Malignant pleural effusion: Secondary | ICD-10-CM | POA: Insufficient documentation

## 2021-01-08 DIAGNOSIS — I2699 Other pulmonary embolism without acute cor pulmonale: Secondary | ICD-10-CM | POA: Insufficient documentation

## 2021-01-08 DIAGNOSIS — Z95828 Presence of other vascular implants and grafts: Secondary | ICD-10-CM

## 2021-01-08 DIAGNOSIS — C8582 Other specified types of non-Hodgkin lymphoma, intrathoracic lymph nodes: Secondary | ICD-10-CM

## 2021-01-08 DIAGNOSIS — Z452 Encounter for adjustment and management of vascular access device: Secondary | ICD-10-CM | POA: Diagnosis not present

## 2021-01-08 DIAGNOSIS — C8522 Mediastinal (thymic) large B-cell lymphoma, intrathoracic lymph nodes: Secondary | ICD-10-CM | POA: Diagnosis not present

## 2021-01-08 DIAGNOSIS — Z7901 Long term (current) use of anticoagulants: Secondary | ICD-10-CM | POA: Insufficient documentation

## 2021-01-08 LAB — CBC WITH DIFFERENTIAL (CANCER CENTER ONLY)
Abs Immature Granulocytes: 0.02 10*3/uL (ref 0.00–0.07)
Basophils Absolute: 0 10*3/uL (ref 0.0–0.1)
Basophils Relative: 0 %
Eosinophils Absolute: 0.1 10*3/uL (ref 0.0–0.5)
Eosinophils Relative: 2 %
HCT: 40.2 % (ref 39.0–52.0)
Hemoglobin: 13.7 g/dL (ref 13.0–17.0)
Immature Granulocytes: 1 %
Lymphocytes Relative: 12 %
Lymphs Abs: 0.5 10*3/uL — ABNORMAL LOW (ref 0.7–4.0)
MCH: 29.4 pg (ref 26.0–34.0)
MCHC: 34.1 g/dL (ref 30.0–36.0)
MCV: 86.3 fL (ref 80.0–100.0)
Monocytes Absolute: 0.4 10*3/uL (ref 0.1–1.0)
Monocytes Relative: 9 %
Neutro Abs: 3.2 10*3/uL (ref 1.7–7.7)
Neutrophils Relative %: 76 %
Platelet Count: 264 10*3/uL (ref 150–400)
RBC: 4.66 MIL/uL (ref 4.22–5.81)
RDW: 13.8 % (ref 11.5–15.5)
WBC Count: 4.2 10*3/uL (ref 4.0–10.5)
nRBC: 0 % (ref 0.0–0.2)

## 2021-01-08 LAB — CMP (CANCER CENTER ONLY)
ALT: 16 U/L (ref 0–44)
AST: 15 U/L (ref 15–41)
Albumin: 4.2 g/dL (ref 3.5–5.0)
Alkaline Phosphatase: 98 U/L (ref 38–126)
Anion gap: 8 (ref 5–15)
BUN: 19 mg/dL (ref 6–20)
CO2: 25 mmol/L (ref 22–32)
Calcium: 9.5 mg/dL (ref 8.9–10.3)
Chloride: 106 mmol/L (ref 98–111)
Creatinine: 1.11 mg/dL (ref 0.61–1.24)
GFR, Estimated: >60 mL/min (ref >60)
Glucose, Bld: 150 mg/dL — ABNORMAL HIGH (ref 70–99)
Potassium: 3.8 mmol/L (ref 3.5–5.1)
Sodium: 139 mmol/L (ref 135–145)
Total Bilirubin: 0.5 mg/dL (ref 0.3–1.2)
Total Protein: 5.9 g/dL — ABNORMAL LOW (ref 6.5–8.1)

## 2021-01-08 LAB — LACTATE DEHYDROGENASE: LDH: 155 U/L (ref 98–192)

## 2021-01-08 MED ORDER — SODIUM CHLORIDE 0.9% FLUSH
10.0000 mL | Freq: Once | INTRAVENOUS | Status: AC
  Administered 2021-01-08: 10 mL via INTRAVENOUS
  Filled 2021-01-08: qty 10

## 2021-01-08 MED ORDER — HEPARIN SOD (PORK) LOCK FLUSH 100 UNIT/ML IV SOLN
500.0000 [IU] | Freq: Once | INTRAVENOUS | Status: AC
  Administered 2021-01-08: 500 [IU] via INTRAVENOUS
  Filled 2021-01-08: qty 5

## 2021-01-08 NOTE — Progress Notes (Signed)
Patient will need a PET scan prior to his next appointment. PET scheduled. Patient sent information regarding PET including preparation instructions via MyChart.   Oncology Nurse Navigator Documentation  Oncology Nurse Navigator Flowsheets 01/08/2021  Abnormal Finding Date -  Confirmed Diagnosis Date -  Diagnosis Status -  Planned Course of Treatment -  Phase of Treatment -  Chemotherapy Actual Start Date: -  Radiation Pending-Reason: -  Radiation Actual Start Date: -  Radiation Expected End Date: -  Radiation Actual End Date: -  Navigator Follow Up Date: 03/09/2021  Navigator Follow Up Reason: Scan Review  Navigator Location [facility]  Navigator Encounter Type Appt/Treatment Plan Review;MyChart  Telephone -  Treatment Initiated Date -  Patient Visit Type MedOnc  Treatment Phase Post-Tx Follow-up  Barriers/Navigation Needs Coordination of Care;Education  Education Other  Interventions Coordination of Care;Education;Psycho-Social Support  Acuity Level 2-Minimal Needs (1-2 Barriers Identified)  Coordination of Care Radiology  Education Method Written  Support Groups/Services Friends and Family  Time Spent with Patient 30

## 2021-01-08 NOTE — Progress Notes (Signed)
Hematology and Oncology Follow Up Visit  Travis Duran [id number] [birth year] 45 y.o. 01/08/2021   Principle Diagnosis:  Mediastinal Large B-cell NHL -- (+) pleural effusion Pulmonary embolism-bilateral  Current Therapy:   S/p cycle #6 of R-EPOCH -- completed in 08/2020 Xarelto 20 mg p.o. daily --started on 07/02/2020 Consolidative radiation therapy to the residual mediastinal mass-to finish radiation therapy on 11/06/2020     Interim History:  Travis Duran is back for follow-up.  He comes in with his wife.  As always, it is fun talking to both of them.  Prior he has been doing well as expected.  He has been very busy.  He is busy at work.  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  He is incredibly talented.  Prior is exercising more.  He actually ran a 5K race last week.  We did go ahead and get a PET scan on him.  This was done a couple days ago.  The PET scan showed some mild but low activity in the mediastinal mass.  It measured 2.5 cm.  The Deauville score was 3.  I am unsure exactly what this implies.  There was some activity in his lower esophagus and proximal stomach.  Again, I am not sure what this signifies.  We we will hold off on doing any endoscopy.  He is totally asymptomatic.  In the past, he has had a thyroid biopsy.  This was done on 10/29/2020.  There was no evidence of malignancy in the biopsy when we did his molecular studies.  I have to believe that what we are seeing on the PET scan is more reactive.  He has had no problems with cough or shortness of breath.  There is no fever.  He has had no rashes.  He is on Xarelto.  We will have to get a CT angiogram of his chest so we see how the pulmonary emboli have responded to treatment.  I will try to set this up in a couple weeks.  Overall, I would have to say his performance status is probably ECOG 0.    Medications:  Current Outpatient Medications:    Melatonin 3 MG CAPS, Take 3 mg by mouth at bedtime.,  Disp: , Rfl:    rivaroxaban (XARELTO) 20 MG TABS tablet, Take 1 tablet (20 mg total) by mouth daily with supper., Disp: 30 tablet, Rfl: 6   ondansetron (ZOFRAN) 4 MG tablet, Take 1-2 tablets (4-8 mg total) by mouth every 8 (eight) hours as needed for nausea (not responsive to prochlorperazine (COMPAZINE)). (Patient not taking: No sig reported), Disp: 20 tablet, Rfl: 0   prochlorperazine (COMPAZINE) 10 MG tablet, Take 1 tablet (10 mg total) by mouth every 6 (six) hours as needed for nausea or vomiting. (Patient not taking: No sig reported), Disp: 30 tablet, Rfl: 0   tadalafil (CIALIS) 10 MG tablet, Take 1 tablet (10 mg total) by mouth daily as needed for erectile dysfunction. (Patient not taking: No sig reported), Disp: 10 tablet, Rfl: 11  Allergies: No Known Allergies  Past Medical History, Surgical history, Social history, and Family History were reviewed and updated.  Review of Systems: Review of Systems  Constitutional:  Negative for unexpected weight change.  HENT:   Negative for mouth sores.   Eyes: Negative.   Respiratory:  Positive for chest tightness, cough and shortness of breath.   Cardiovascular: Negative.   Gastrointestinal: Negative.   Endocrine: Negative.   Genitourinary: Negative.    Musculoskeletal:  Negative.   Skin: Negative.   Neurological: Negative.   Hematological: Negative.   Psychiatric/Behavioral: Negative.     Physical Exam:  weight is 94.3 kg. His oral temperature is 98 F (36.7 C). His blood pressure is 110/67 and his pulse is 86. His respiration is 20 and oxygen saturation is 98%.   Wt Readings from Last 3 Encounters:  01/08/21 94.3 kg  12/05/20 94.3 kg  11/05/20 93 kg    Physical Exam Vitals reviewed.  HENT:     Head: Normocephalic and atraumatic.  Eyes:     Pupils: Pupils are equal, round, and reactive to light.  Cardiovascular:     Rate and Rhythm: Regular rhythm. Tachycardia present.     Heart sounds: Normal heart sounds.     Comments:  Cardiac exam is regular rate and rhythm.  There are no murmurs, rubs or bruits.   Pulmonary:     Comments: Lungs are clear bilaterally.  He has good air movement bilaterally.  There are no rales, wheezes or rhonchi.    Abdominal:     General: Bowel sounds are normal.     Palpations: Abdomen is soft.  Musculoskeletal:        General: No tenderness or deformity. Normal range of motion.     Cervical back: Normal range of motion.  Lymphadenopathy:     Cervical: No cervical adenopathy.  Skin:    General: Skin is warm and dry.     Findings: No erythema or rash.  Neurological:     Mental Status: He is alert and oriented to person, place, and time.  Psychiatric:        Behavior: Behavior normal.        Thought Content: Thought content normal.        Judgment: Judgment normal.   Lab Results  Component Value Date   WBC 4.2 01/08/2021   HGB 13.7 01/08/2021   HCT 40.2 01/08/2021   MCV 86.3 01/08/2021   PLT 264 01/08/2021     Chemistry      Component Value Date/Time   NA 141 11/05/2020 1117   K 4.0 11/05/2020 1117   CL 106 11/05/2020 1117   CO2 29 11/05/2020 1117   BUN 12 11/05/2020 1117   CREATININE 1.17 11/05/2020 1117      Component Value Date/Time   CALCIUM 9.6 11/05/2020 1117   ALKPHOS 100 11/05/2020 1117   AST 17 11/05/2020 1117   ALT 20 11/05/2020 1117   BILITOT 0.4 11/05/2020 1117      Impression and Plan: Travis Duran is a really nice 45 year old white male.  He presented with a massive mediastinal tumor.  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  This is a B-cell lymphoma.  We treated him with 6 cycles of chemotherapy with infusional R-EPOCH.  He did well with this.  Had a good response.  We then went ahead and gave him some consolidative radiation therapy because he had such a large mediastinal mass.  We will have to set up another PET scan on him in a couple months.  Again I am not sure exactly what  the significance of this recent PET scan is.  I think the next PET scan will clearly show [place] if there is any evidence of disease recurrence.  If he does have disease recurrence, we will have to see about considering him for CAR-T therapy.  Otherwise, we will see about the  CT angiogram of his chest.  I will set this up for a couple weeks.  We will plan to see him back in August.  I want to see him back about a week after we do his PET scan.  If the CT angiogram shows resolution of his pulmonary emboli, I will try to move his Xarelto down to 10 mg a day.   Volanda Napoleon, MD 6/9/20228:32 AM

## 2021-01-08 NOTE — Telephone Encounter (Signed)
Pt stated to make appts and he would view on mychart, appts made and scan orders noted- central sch to call pt with appts after approval   Travis Duran

## 2021-01-08 NOTE — Patient Instructions (Signed)
Implanted Port Insertion, Care After This sheet gives you information about how to care for yourself after your procedure. Your health care provider may also give you more specific instructions. If you have problems or questions, contact your health care provider. What can I expect after the procedure? After the procedure, it is common to have:  Discomfort at the port insertion site.  Bruising on the skin over the port. This should improve over 3-4 days. Follow these instructions at home: Port care  After your port is placed, you will get a manufacturer's information card. The card has information about your port. Keep this card with you at all times.  Take care of the port as told by your health care provider. Ask your health care provider if you or a family member can get training for taking care of the port at home. A home health care nurse may also take care of the port.  Make sure to remember what type of port you have. Incision care  Follow instructions from your health care provider about how to take care of your port insertion site. Make sure you: ? Wash your hands with soap and water before and after you change your bandage (dressing). If soap and water are not available, use hand sanitizer. ? Change your dressing as told by your health care provider. ? Leave stitches (sutures), skin glue, or adhesive strips in place. These skin closures may need to stay in place for 2 weeks or longer. If adhesive strip edges start to loosen and curl up, you may trim the loose edges. Do not remove adhesive strips completely unless your health care provider tells you to do that.  Check your port insertion site every day for signs of infection. Check for: ? Redness, swelling, or pain. ? Fluid or blood. ? Warmth. ? Pus or a bad smell.      Activity  Return to your normal activities as told by your health care provider. Ask your health care provider what activities are safe for you.  Do not  lift anything that is heavier than 10 lb (4.5 kg), or the limit that you are told, until your health care provider says that it is safe. General instructions  Take over-the-counter and prescription medicines only as told by your health care provider.  Do not take baths, swim, or use a hot tub until your health care provider approves. Ask your health care provider if you may take showers. You may only be allowed to take sponge baths.  Do not drive for 24 hours if you were given a sedative during your procedure.  Wear a medical alert bracelet in case of an emergency. This will tell any health care providers that you have a port.  Keep all follow-up visits as told by your health care provider. This is important. Contact a health care provider if:  You cannot flush your port with saline as directed, or you cannot draw blood from the port.  You have a fever or chills.  You have redness, swelling, or pain around your port insertion site.  You have fluid or blood coming from your port insertion site.  Your port insertion site feels warm to the touch.  You have pus or a bad smell coming from the port insertion site. Get help right away if:  You have chest pain or shortness of breath.  You have bleeding from your port that you cannot control. Summary  Take care of the port as told by your   health care provider. Keep the manufacturer's information card with you at all times.  Change your dressing as told by your health care provider.  Contact a health care provider if you have a fever or chills or if you have redness, swelling, or pain around your port insertion site.  Keep all follow-up visits as told by your health care provider. This information is not intended to replace advice given to you by your health care provider. Make sure you discuss any questions you have with your health care provider. Document Revised: 02/14/2018 Document Reviewed: 02/14/2018 Elsevier Patient Education   2021 Elsevier Inc.  

## 2021-01-30 ENCOUNTER — Encounter: Payer: Self-pay | Admitting: *Deleted

## 2021-01-30 NOTE — Progress Notes (Signed)
Patient reached out via MyChart to inquire about his CT Angio that has not been scheduled. This was ordered 01/08/2021. Investigated and found that the scan was ordered incorrectly and never was sent for insurance authorization. Spoke to our insurance specialist and she was able to call the insurance and obtain authorization at that time. Patient notified of reason for delay and given the number to schedule scan.   Oncology Nurse Navigator Documentation  Oncology Nurse Navigator Flowsheets 01/30/2021  Abnormal Finding Date -  Confirmed Diagnosis Date -  Diagnosis Status -  Planned Course of Treatment -  Phase of Treatment -  Chemotherapy Actual Start Date: -  Radiation Pending-Reason: -  Radiation Actual Start Date: -  Radiation Expected End Date: -  Radiation Actual End Date: -  Navigator Follow Up Date: 03/09/2021  Navigator Follow Up Reason: Scan Review  Navigator Location [facility]  Navigator Encounter Type MyChart;Other:  Telephone -  Treatment Initiated Date -  Patient Visit Type MedOnc  Treatment Phase Post-Tx Follow-up  Barriers/Navigation Needs Coordination of Care;Education  Education Other  Interventions Coordination of Care;Education;Psycho-Social Support  Acuity Level 2-Minimal Needs (1-2 Barriers Identified)  Coordination of Care Radiology;Other  Education Method Written  Support Groups/Services Friends and Family  Time Spent with Patient 76

## 2021-02-05 ENCOUNTER — Ambulatory Visit
Admission: RE | Admit: 2021-02-05 | Discharge: 2021-02-05 | Disposition: A | Discharge status: Home / Self Care | Payer: BC Managed Care – PPO | Source: Ambulatory Visit | Attending: Hematology & Oncology | Admitting: Hematology & Oncology

## 2021-02-05 ENCOUNTER — Encounter: Payer: Self-pay | Admitting: *Deleted

## 2021-02-05 ENCOUNTER — Other Ambulatory Visit: Payer: Self-pay

## 2021-02-05 DIAGNOSIS — C8582 Other specified types of non-Hodgkin lymphoma, intrathoracic lymph nodes: Secondary | ICD-10-CM | POA: Insufficient documentation

## 2021-02-05 MED ORDER — IOHEXOL 350 MG/ML SOLN
75.0000 mL | Freq: Once | INTRAVENOUS | Status: AC | PRN
  Administered 2021-02-05: 75 mL via INTRAVENOUS

## 2021-02-11 ENCOUNTER — Other Ambulatory Visit: Payer: Self-pay | Admitting: *Deleted

## 2021-02-11 MED ORDER — RIVAROXABAN 10 MG PO TABS
10.0000 mg | ORAL_TABLET | Freq: Every day | ORAL | 3 refills | Status: DC
Start: 2021-02-11 — End: 2021-08-24

## 2021-03-09 ENCOUNTER — Ambulatory Visit
Admission: RE | Admit: 2021-03-09 | Discharge: 2021-03-09 | Disposition: A | Discharge status: Home / Self Care | Payer: BC Managed Care – PPO | Source: Ambulatory Visit | Attending: Hematology & Oncology | Admitting: Hematology & Oncology

## 2021-03-09 ENCOUNTER — Other Ambulatory Visit: Payer: Self-pay

## 2021-03-09 DIAGNOSIS — C8582 Other specified types of non-Hodgkin lymphoma, intrathoracic lymph nodes: Secondary | ICD-10-CM | POA: Insufficient documentation

## 2021-03-09 DIAGNOSIS — I700 Atherosclerosis of aorta: Secondary | ICD-10-CM | POA: Insufficient documentation

## 2021-03-09 DIAGNOSIS — R222 Localized swelling, mass and lump, trunk: Secondary | ICD-10-CM | POA: Insufficient documentation

## 2021-03-09 LAB — GLUCOSE, CAPILLARY: Glucose-Capillary: 98 mg/dL (ref 70–99)

## 2021-03-09 MED ORDER — FLUDEOXYGLUCOSE F - 18 (FDG) INJECTION
10.8000 | Freq: Once | INTRAVENOUS | Status: AC | PRN
  Administered 2021-03-09: 10.7 via INTRAVENOUS

## 2021-03-10 ENCOUNTER — Encounter: Payer: Self-pay | Admitting: *Deleted

## 2021-03-10 NOTE — Progress Notes (Signed)
Oncology Nurse Navigator Documentation  Oncology Nurse Navigator Flowsheets 03/10/2021  Abnormal Finding Date -  Confirmed Diagnosis Date -  Diagnosis Status -  Planned Course of Treatment -  Phase of Treatment -  Chemotherapy Actual Start Date: -  Radiation Pending-Reason: -  Radiation Actual Start Date: -  Radiation Expected End Date: -  Radiation Actual End Date: -  Navigator Follow Up Date: 03/11/2021  Navigator Follow Up Reason: Follow-up Appointment  Navigator Location [facility]  Navigator Encounter Type Scan Review  Telephone -  Treatment Initiated Date -  Patient Visit Type MedOnc  Treatment Phase Post-Tx Follow-up  Barriers/Navigation Needs Coordination of Care;Education  Education -  Interventions None Required  Acuity Level 2-Minimal Needs (1-2 Barriers Identified)  Coordination of Care -  Education Method -  Support Groups/Services Friends and Family  Time Spent with Patient 15

## 2021-03-11 ENCOUNTER — Inpatient Hospital Stay: Payer: BC Managed Care – PPO | Attending: Hematology & Oncology

## 2021-03-11 ENCOUNTER — Encounter: Payer: Self-pay | Admitting: *Deleted

## 2021-03-11 ENCOUNTER — Encounter: Payer: Self-pay | Admitting: Hematology & Oncology

## 2021-03-11 ENCOUNTER — Inpatient Hospital Stay: Payer: BC Managed Care – PPO

## 2021-03-11 ENCOUNTER — Other Ambulatory Visit: Payer: Self-pay

## 2021-03-11 ENCOUNTER — Inpatient Hospital Stay (HOSPITAL_BASED_OUTPATIENT_CLINIC_OR_DEPARTMENT_OTHER): Payer: BC Managed Care – PPO | Admitting: Hematology & Oncology

## 2021-03-11 VITALS — BP 121/80 | HR 70 | Temp 98.1°F | Resp 17 | Ht 69.0 in | Wt 209.2 lb

## 2021-03-11 DIAGNOSIS — Z8572 Personal history of non-Hodgkin lymphomas: Secondary | ICD-10-CM | POA: Diagnosis not present

## 2021-03-11 DIAGNOSIS — C8582 Other specified types of non-Hodgkin lymphoma, intrathoracic lymph nodes: Secondary | ICD-10-CM

## 2021-03-11 DIAGNOSIS — Z7901 Long term (current) use of anticoagulants: Secondary | ICD-10-CM | POA: Insufficient documentation

## 2021-03-11 DIAGNOSIS — I2699 Other pulmonary embolism without acute cor pulmonale: Secondary | ICD-10-CM | POA: Diagnosis not present

## 2021-03-11 DIAGNOSIS — Z452 Encounter for adjustment and management of vascular access device: Secondary | ICD-10-CM | POA: Diagnosis not present

## 2021-03-11 DIAGNOSIS — Z95828 Presence of other vascular implants and grafts: Secondary | ICD-10-CM

## 2021-03-11 LAB — CBC WITH DIFFERENTIAL (CANCER CENTER ONLY)
Abs Immature Granulocytes: 0.03 K/uL (ref 0.00–0.07)
Basophils Absolute: 0 K/uL (ref 0.0–0.1)
Basophils Relative: 0 %
Eosinophils Absolute: 0.1 K/uL (ref 0.0–0.5)
Eosinophils Relative: 2 %
HCT: 39.6 % (ref 39.0–52.0)
Hemoglobin: 13.4 g/dL (ref 13.0–17.0)
Immature Granulocytes: 1 %
Lymphocytes Relative: 13 %
Lymphs Abs: 0.6 K/uL — ABNORMAL LOW (ref 0.7–4.0)
MCH: 30.2 pg (ref 26.0–34.0)
MCHC: 33.8 g/dL (ref 30.0–36.0)
MCV: 89.4 fL (ref 80.0–100.0)
Monocytes Absolute: 0.5 K/uL (ref 0.1–1.0)
Monocytes Relative: 10 %
Neutro Abs: 3.5 K/uL (ref 1.7–7.7)
Neutrophils Relative %: 74 %
Platelet Count: 266 K/uL (ref 150–400)
RBC: 4.43 MIL/uL (ref 4.22–5.81)
RDW: 13.8 % (ref 11.5–15.5)
WBC Count: 4.8 K/uL (ref 4.0–10.5)
nRBC: 0 % (ref 0.0–0.2)

## 2021-03-11 LAB — CMP (CANCER CENTER ONLY)
ALT: 13 U/L (ref 0–44)
AST: 13 U/L — ABNORMAL LOW (ref 15–41)
Albumin: 4.2 g/dL (ref 3.5–5.0)
Alkaline Phosphatase: 108 U/L (ref 38–126)
Anion gap: 8 (ref 5–15)
BUN: 15 mg/dL (ref 6–20)
CO2: 27 mmol/L (ref 22–32)
Calcium: 9.7 mg/dL (ref 8.9–10.3)
Chloride: 105 mmol/L (ref 98–111)
Creatinine: 1.09 mg/dL (ref 0.61–1.24)
GFR, Estimated: >60 mL/min (ref >60)
Glucose, Bld: 83 mg/dL (ref 70–99)
Potassium: 3.9 mmol/L (ref 3.5–5.1)
Sodium: 140 mmol/L (ref 135–145)
Total Bilirubin: 0.6 mg/dL (ref 0.3–1.2)
Total Protein: 6 g/dL — ABNORMAL LOW (ref 6.5–8.1)

## 2021-03-11 LAB — LACTATE DEHYDROGENASE: LDH: 136 U/L (ref 98–192)

## 2021-03-11 MED ORDER — SODIUM CHLORIDE 0.9% FLUSH
10.0000 mL | Freq: Once | INTRAVENOUS | Status: AC
  Administered 2021-03-11: 10 mL via INTRAVENOUS
  Filled 2021-03-11: qty 10

## 2021-03-11 MED ORDER — HEPARIN SOD (PORK) LOCK FLUSH 100 UNIT/ML IV SOLN
500.0000 [IU] | Freq: Once | INTRAVENOUS | Status: AC
  Administered 2021-03-11: 500 [IU] via INTRAVENOUS
  Filled 2021-03-11: qty 5

## 2021-03-11 NOTE — Patient Instructions (Signed)
Implanted Port Home Guide An implanted port is a device that is placed under the skin. It is usually placed in the chest. The device can be used to give IV medicine, to take blood, or for dialysis. You may have an implanted port if: You need IV medicine that would be irritating to the small veins in your hands or arms. You need IV medicines, such as antibiotics, for a long period of time. You need IV nutrition for a long period of time. You need dialysis. When you have a port, your health care provider can choose to use the port instead of veins in your arms for these procedures. You may have fewer limitations when using a port than you would if you used other types of long-term IVs, and you will likely be able to return to normal activities afteryour incision heals. An implanted port has two main parts: Reservoir. The reservoir is the part where a needle is inserted to give medicines or draw blood. The reservoir is round. After it is placed, it appears as a small, raised area under your skin. Catheter. The catheter is a thin, flexible tube that connects the reservoir to a vein. Medicine that is inserted into the reservoir goes into the catheter and then into the vein. How is my port accessed? To access your port: A numbing cream may be placed on the skin over the port site. Your health care provider will put on a mask and sterile gloves. The skin over your port will be cleaned carefully with a germ-killing soap and allowed to dry. Your health care provider will gently pinch the port and insert a needle into it. Your health care provider will check for a blood return to make sure the port is in the vein and is not clogged. If your port needs to remain accessed to get medicine continuously (constant infusion), your health care provider will place a clear bandage (dressing) over the needle site. The dressing and needle will need to be changed every week, or as told by your health care provider. What  is flushing? Flushing helps keep the port from getting clogged. Follow instructions from your health care provider about how and when to flush the port. Ports are usually flushed with saline solution or a medicine called heparin. The need for flushing will depend on how the port is used: If the port is only used from time to time to give medicines or draw blood, the port may need to be flushed: Before and after medicines have been given. Before and after blood has been drawn. As part of routine maintenance. Flushing may be recommended every 4-6 weeks. If a constant infusion is running, the port may not need to be flushed. Throw away any syringes in a disposal container that is meant for sharp items (sharps container). You can buy a sharps container from a pharmacy, or you can make one by using an empty hard plastic bottle with a cover. How long will my port stay implanted? The port can stay in for as long as your health care provider thinks it is needed. When it is time for the port to come out, a surgery will be done to remove it. The surgery will be similar to the procedure that was done to putthe port in. Follow these instructions at home:  Flush your port as told by your health care provider. If you need an infusion over several days, follow instructions from your health care provider about how to take   care of your port site. Make sure you: Wash your hands with soap and water before you change your dressing. If soap and water are not available, use alcohol-based hand sanitizer. Change your dressing as told by your health care provider. Place any used dressings or infusion bags into a plastic bag. Throw that bag in the trash. Keep the dressing that covers the needle clean and dry. Do not get it wet. Do not use scissors or sharp objects near the tube. Keep the tube clamped, unless it is being used. Check your port site every day for signs of infection. Check for: Redness, swelling, or  pain. Fluid or blood. Pus or a bad smell. Protect the skin around the port site. Avoid wearing bra straps that rub or irritate the site. Protect the skin around your port from seat belts. Place a soft pad over your chest if needed. Bathe or shower as told by your health care provider. The site may get wet as long as you are not actively receiving an infusion. Return to your normal activities as told by your health care provider. Ask your health care provider what activities are safe for you. Carry a medical alert card or wear a medical alert bracelet at all times. This will let health care providers know that you have an implanted port in case of an emergency. Get help right away if: You have redness, swelling, or pain at the port site. You have fluid or blood coming from your port site. You have pus or a bad smell coming from the port site. You have a fever. Summary Implanted ports are usually placed in the chest for long-term IV access. Follow instructions from your health care provider about flushing the port and changing bandages (dressings). Take care of the area around your port by avoiding clothing that puts pressure on the area, and by watching for signs of infection. Protect the skin around your port from seat belts. Place a soft pad over your chest if needed. Get help right away if you have a fever or you have redness, swelling, pain, drainage, or a bad smell at the port site. This information is not intended to replace advice given to you by your health care provider. Make sure you discuss any questions you have with your healthcare provider. Document Revised: 12/03/2019 Document Reviewed: 12/03/2019 Elsevier Patient Education  2022 Elsevier Inc.  

## 2021-03-11 NOTE — Progress Notes (Signed)
Hematology and Oncology Follow Up Visit  Travis Duran [id number] 11/30/1975 45 y.o. 03/11/2021   Principle Diagnosis:  Mediastinal Large B-cell NHL -- (+) pleural effusion Pulmonary embolism-bilateral  Current Therapy:   S/p cycle #6 of R-EPOCH -- completed in 08/2020 Xarelto 10 mg p.o. daily --complete therapy on 07/2021  Consolidative radiation therapy to the residual mediastinal mass-to finish radiation therapy on 11/06/2020     Interim History:  Travis Duran is back for follow-up.  He comes in with his wife.  He looks fantastic.  His hair is growing nicely.  He is more active.  He is running.  I am just happy that his quality of life is getting back to where he would like.  The big news is that he and his wife are going out to [place] to pick up a car.  He thought about driving it back to [place].  However, there is no air conditioning in the car.  His wife just will not do this with him.  They will be going to [employer] when they are out in [place].  Para he did have a PET scan done.  This was done a couple days ago.  PET scan showed improvement in the mediastinal mass.  The mediastinal mass now measures 5.2 x 2.2 cm.  The SUV is 3.  Everything else looks fine.  There is some metabolism in the stomach which appears to be reactive.  I would have to say that he is in remission.  I still want to make sure that we follow him up.  I would like to have another PET scan to be done in December.  He has had no problems with cough.  There is no shortness of breath.  He has had no nausea or vomiting.  There is no change in bowel or bladder habits.  He has no leg swelling.  He has had no rashes.  There is no fever.  Overall, his performance status is ECOG 0.    Medications:  Current Outpatient Medications:    Melatonin 3 MG CAPS, Take 3 mg by mouth at bedtime., Disp: , Rfl:    rivaroxaban (XARELTO) 10 MG TABS tablet, Take 1 tablet (10 mg total) by mouth daily., Disp: 30 tablet, Rfl:  3   ondansetron (ZOFRAN) 4 MG tablet, Take 1-2 tablets (4-8 mg total) by mouth every 8 (eight) hours as needed for nausea (not responsive to prochlorperazine (COMPAZINE)). (Patient not taking: No sig reported), Disp: 20 tablet, Rfl: 0   prochlorperazine (COMPAZINE) 10 MG tablet, Take 1 tablet (10 mg total) by mouth every 6 (six) hours as needed for nausea or vomiting. (Patient not taking: No sig reported), Disp: 30 tablet, Rfl: 0   tadalafil (CIALIS) 10 MG tablet, Take 1 tablet (10 mg total) by mouth daily as needed for erectile dysfunction. (Patient not taking: No sig reported), Disp: 10 tablet, Rfl: 11  Allergies: No Known Allergies  Past Medical History, Surgical history, Social history, and Family History were reviewed and updated.  Review of Systems: Review of Systems  Constitutional:  Negative for unexpected weight change.  HENT:   Negative for mouth sores.   Eyes: Negative.   Respiratory:  Positive for chest tightness, cough and shortness of breath.   Cardiovascular: Negative.   Gastrointestinal: Negative.   Endocrine: Negative.   Genitourinary: Negative.    Musculoskeletal: Negative.   Skin: Negative.   Neurological: Negative.   Hematological: Negative.   Psychiatric/Behavioral: Negative.     Physical Exam:  height is '5\' 9"'$  (1.753 m) and weight is 209 lb 2.6 oz (94.9 kg). His oral temperature is 98.1 F (36.7 C). His blood pressure is 121/80 and his pulse is 70. His respiration is 17 and oxygen saturation is 100%.   Wt Readings from Last 3 Encounters:  03/11/21 209 lb 2.6 oz (94.9 kg)  01/08/21 208 lb (94.3 kg)  12/05/20 208 lb (94.3 kg)    Physical Exam Vitals reviewed.  HENT:     Head: Normocephalic and atraumatic.  Eyes:     Pupils: Pupils are equal, round, and reactive to light.  Cardiovascular:     Rate and Rhythm: Regular rhythm. Tachycardia present.     Heart sounds: Normal heart sounds.     Comments: Cardiac exam is regular rate and rhythm.  There are no  murmurs, rubs or bruits.   Pulmonary:     Comments: Lungs are clear bilaterally.  He has good air movement bilaterally.  There are no rales, wheezes or rhonchi.    Abdominal:     General: Bowel sounds are normal.     Palpations: Abdomen is soft.  Musculoskeletal:        General: No tenderness or deformity. Normal range of motion.     Cervical back: Normal range of motion.  Lymphadenopathy:     Cervical: No cervical adenopathy.  Skin:    General: Skin is warm and dry.     Findings: No erythema or rash.  Neurological:     Mental Status: He is alert and oriented to person, place, and time.  Psychiatric:        Behavior: Behavior normal.        Thought Content: Thought content normal.        Judgment: Judgment normal.   Lab Results  Component Value Date   WBC 4.8 03/11/2021   HGB 13.4 03/11/2021   HCT 39.6 03/11/2021   MCV 89.4 03/11/2021   PLT 266 03/11/2021     Chemistry      Component Value Date/Time   NA 140 03/11/2021 0807   K 3.9 03/11/2021 0807   CL 105 03/11/2021 0807   CO2 27 03/11/2021 0807   BUN 15 03/11/2021 0807   CREATININE 1.09 03/11/2021 0807      Component Value Date/Time   CALCIUM 9.7 03/11/2021 0807   ALKPHOS 108 03/11/2021 0807   AST 13 (L) 03/11/2021 0807   ALT 13 03/11/2021 0807   BILITOT 0.6 03/11/2021 0807      Impression and Plan: Travis Duran is a really nice 45 year old white male.  He presented with a massive mediastinal tumor.  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  This is a B-cell lymphoma.  We treated him with 6 cycles of chemotherapy with infusional R-EPOCH.  He did well with this.  Had a good response.  We then went ahead and gave him some consolidative radiation therapy because he had such a large mediastinal mass.  I feel much more better regarding the PET scan.  I am just happy that there is still improvement in the size and activity.  This does not surprise  me.  Again, we would like to get another PET scan on him in December.  I think this would be reasonable.  If this PET scan looks fine, then we will see about stopping any further PET scans.    Of note, he had a CT angiogram done  in July.  There is no residual pulmonary embolism.  We decreased his Xarelto down to 10 mg a day.  We will stop the Xarelto and December.   I will plan to see him in December.  Again we will have the PET scan done.  We will have his Port-A-Cath flushed at [facility].  Volanda Napoleon, MD 8/10/20229:08 AM

## 2021-03-11 NOTE — Progress Notes (Signed)
Patient will need a PET scan scheduled in December. Will get this scheduled in November.   Oncology Nurse Navigator Documentation  Oncology Nurse Navigator Flowsheets 03/11/2021  Abnormal Finding Date -  Confirmed Diagnosis Date -  Diagnosis Status -  Planned Course of Treatment -  Phase of Treatment -  Chemotherapy Actual Start Date: -  Radiation Pending-Reason: -  Radiation Actual Start Date: -  Radiation Expected End Date: -  Radiation Actual End Date: -  Navigator Follow Up Date: 06/02/2021  Navigator Follow Up Reason: Radiology  Navigator Location [facility]  Navigator Encounter Type Appt/Treatment Plan Review  Telephone -  Treatment Initiated Date -  Patient Visit Type MedOnc  Treatment Phase Post-Tx Follow-up  Barriers/Navigation Needs No Barriers At This Time  Education -  Interventions None Required  Acuity Level 1-No Barriers  Coordination of Care -  Education Method -  Support Groups/Services Friends and Family  Time Spent with Patient 15

## 2021-03-26 ENCOUNTER — Encounter: Payer: Self-pay | Admitting: *Deleted

## 2021-04-08 ENCOUNTER — Encounter (HOSPITAL_COMMUNITY): Payer: Self-pay | Admitting: Hematology & Oncology

## 2021-04-08 ENCOUNTER — Encounter: Payer: Self-pay | Admitting: Hematology & Oncology

## 2021-04-09 ENCOUNTER — Other Ambulatory Visit: Payer: Self-pay

## 2021-04-09 ENCOUNTER — Ambulatory Visit (INDEPENDENT_AMBULATORY_CARE_PROVIDER_SITE_OTHER): Payer: BC Managed Care – PPO | Admitting: Family Medicine

## 2021-04-09 ENCOUNTER — Encounter: Payer: Self-pay | Admitting: Family Medicine

## 2021-04-09 VITALS — BP 104/74 | HR 80 | Temp 98.2°F | Ht 69.0 in | Wt 211.0 lb

## 2021-04-09 DIAGNOSIS — Z7689 Persons encountering health services in other specified circumstances: Secondary | ICD-10-CM | POA: Diagnosis not present

## 2021-04-09 DIAGNOSIS — R7989 Other specified abnormal findings of blood chemistry: Secondary | ICD-10-CM | POA: Diagnosis not present

## 2021-04-09 DIAGNOSIS — Z13220 Encounter for screening for lipoid disorders: Secondary | ICD-10-CM | POA: Diagnosis not present

## 2021-04-09 DIAGNOSIS — Z0000 Encounter for general adult medical examination without abnormal findings: Secondary | ICD-10-CM | POA: Insufficient documentation

## 2021-04-09 DIAGNOSIS — C8332 Diffuse large B-cell lymphoma, intrathoracic lymph nodes: Secondary | ICD-10-CM

## 2021-04-09 DIAGNOSIS — G4733 Obstructive sleep apnea (adult) (pediatric): Secondary | ICD-10-CM

## 2021-04-09 NOTE — Assessment & Plan Note (Signed)
45 year old patient with history of lymphoma being actively followed and managed by both oncology and radiology and radiation oncology services.  He has been actively working towards a healthier lifestyle with dietary and exercise habits.  He is training for a half marathon in November and has been increasing his mileage in a structured manner.  He denies any current issues and physical exam reveals benign cardiac and pulmonary testing with regular rate and rhythm, positive S1-S2, no additional heart sounds, clear lung fields throughout without additional breath sounds, abdomen soft, nontender, nondistended, and normoactive bowel sounds appreciated.  I have ordered risk stratification fasting labs for routine screening, we will coordinate his annual physical towards the middle of December at which time he would have had repeat PET scan coordinated through oncology.  He was advised to contact us for any concerns or issues over the interim.

## 2021-04-09 NOTE — Patient Instructions (Addendum)
-   Obtain fasting labs - Our office will contact you with lab results once available - Continue with your training regimen for the half marathon - Contact our office for any issues if encountered - Recommend follow-up with ENT/sleep medicine (a referral coordinator will contact you for scheduling) - Return for follow-up for annual physical in mid December

## 2021-04-09 NOTE — Assessment & Plan Note (Signed)
Patient with known diagnosis of the same for this chronic issue, denies any current issues but has not been compliant with CPAP citing improved sleep and weight loss.  Given our shared goals regarding optimized health, weight loss, minimizing cardiac risks given his comorbid medical history, he is amenable to a referral to ENT/sleep medicine for reevaluation and possible repeat sleep study to determine persistent need for CPAP machine.  He plans to schedule this towards the end of the year as he is actively working towards weight loss and would like to be assessed once that has been achieved.  A referral was placed today, he was advised to discuss scheduling with the referral coordinator.  We will track this issue peripherally.

## 2021-04-09 NOTE — Assessment & Plan Note (Signed)
This is a chronic and stable condition, he has upcoming work-up involving PET scan with oncology and radiation oncology who he actively follows.  Noted some out of range values on his comprehensive metabolic panel ordered by their specialty, we will recheck these labs as part of his routine screening.  We will follow peripherally on this issue.

## 2021-04-09 NOTE — Progress Notes (Signed)
Primary Care / Sports Medicine Office Visit  Patient Information:  Patient ID: Travis Duran, male DOB: April 14, 1976 Age: 45 y.o. MRN: [id number]   Travis Duran is a pleasant 45 y.o. male presenting with the following:  Chief Complaint  Patient presents with   New Patient (Initial Visit)   Establish Care    Review of Systems pertinent details above   Patient Active Problem List   Diagnosis Date Noted   Establishing care with new doctor, encounter for 04/09/2021   Lymphoma malignant, immunoblastic ([place]) 08/25/2020   Pulmonary embolism ([place]) 07/02/2020   Acute respiratory failure with hypoxia ([place]) 07/02/2020   Cough 05/15/2020   Encounter for antineoplastic chemotherapy 05/02/2020   Diffuse large B cell lymphoma ([place]) 05/02/2020   Lymphoma, large cell, 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   rivaroxaban (XARELTO) 10 MG TABS tablet Take 1 tablet (10 mg total) by mouth daily.   tadalafil (CIALIS) 10 MG tablet Take 1 tablet (10 mg total) by mouth daily as needed for erectile dysfunction.   [DISCONTINUED] apixaban (ELIQUIS) 5 MG TABS tablet Take 1 tablet (5 mg total) by mouth 2 (two) times daily. (Patient not taking: No sig reported)   [DISCONTINUED] ondansetron (ZOFRAN) 4 MG tablet Take 1-2 tablets (4-8 mg total) by mouth every 8 (eight) hours as needed for nausea (not responsive to prochlorperazine (COMPAZINE)). (Patient not taking: No sig reported)   [DISCONTINUED]  prochlorperazine (COMPAZINE) 10 MG tablet Take 1 tablet (10 mg total) by mouth every 6 (six) hours as needed for nausea or vomiting. (Patient not taking: No sig reported)   No facility-administered encounter medications on file as of 04/09/2021.   Past Surgical History:  Procedure Laterality Date   BRONCHIAL WASHINGS  07/03/2020   Procedure: BRONCHIAL WASHINGS;  Surgeon: Spero Geralds, MD;  Location: [facility];  Service: Pulmonary;;   burn to left hand     skin graphs   FINE NEEDLE ASPIRATION  04/25/2020   Procedure: FINE NEEDLE ASPIRATION (FNA) LINEAR;  Surgeon: Candee Furbish, MD;  Location: [facility];  Service: Pulmonary;;   IR IMAGING GUIDED PORT INSERTION  05/07/2020   IR THORACENTESIS ASP PLEURAL SPACE W/IMG GUIDE  04/25/2020   IR THORACENTESIS ASP PLEURAL SPACE W/IMG GUIDE  05/07/2020   REFRACTIVE SURGERY  09/2012   bilateral    THORACENTESIS  04/25/2020   Procedure: THORACENTESIS;  Surgeon: Candee Furbish, MD;  Location: [facility];  Service: Pulmonary;;   VIDEO BRONCHOSCOPY N/A 07/03/2020   Procedure: VIDEO BRONCHOSCOPY WITHOUT FLUORO;  Surgeon: Spero Geralds, MD;  Location: [facility];  Service: Pulmonary;  Laterality: N/A;   VIDEO BRONCHOSCOPY WITH ENDOBRONCHIAL ULTRASOUND N/A 04/25/2020   Procedure: VIDEO BRONCHOSCOPY WITH ENDOBRONCHIAL ULTRASOUND;  Surgeon: Candee Furbish, MD;  Location: [facility];  Service: Pulmonary;  Laterality: N/A;    Vitals:   04/09/21 0949  BP: 104/74  Pulse: 80  Temp: 98.2 F (36.8 C)  SpO2:  98%   Vitals:   04/09/21 0949  Weight: 211 lb (95.7 kg)  Height: '5\' 9"'$  (1.753 m)   Body mass index is 31.16 kg/m.  No results found.   Independent interpretation of notes and tests performed by another provider:   None  Procedures performed:   None  Pertinent History, Exam, Impression, and Recommendations:   OBSTRUCTIVE SLEEP APNEA Patient with known diagnosis of the same for this chronic issue, denies any current issues but has  not been compliant with CPAP citing improved sleep and weight loss.  Given our shared goals regarding optimized health, weight loss, minimizing cardiac risks given his comorbid medical history, he is amenable to a referral to ENT/sleep medicine for reevaluation and possible repeat sleep study to determine persistent need for CPAP machine.  He plans to schedule this towards the end of the year as he is actively working towards weight loss and would like to be assessed once that has been achieved.  A referral was placed today, he was advised to discuss scheduling with the referral coordinator.  We will track this issue peripherally.  Establishing care with new doctor, encounter for 45 year old patient with history of lymphoma being actively followed and managed by both oncology and radiology and radiation oncology services.  He has been actively working towards a healthier lifestyle with dietary and exercise habits.  He is training for a half marathon in November and has been increasing his mileage in a structured manner.  He denies any current issues and physical exam reveals benign cardiac and pulmonary testing with regular rate and rhythm, positive S1-S2, no additional heart sounds, clear lung fields throughout without additional breath sounds, abdomen soft, nontender, nondistended, and normoactive bowel sounds appreciated.  I have ordered risk stratification fasting labs for routine screening, we will coordinate his annual physical towards the middle of December at which time he would have had repeat PET scan coordinated through oncology.  He was advised to contact us for any concerns or issues over the interim.  Diffuse large B cell lymphoma ([place]) This is a chronic and stable condition, he has upcoming work-up involving PET scan with oncology and radiation oncology who he actively follows.  Noted some out of range values on his comprehensive metabolic panel ordered by their specialty, we will recheck  these labs as part of his routine screening.  We will follow peripherally on this issue.   Orders & Medications No orders of the defined types were placed in this encounter.  Orders Placed This Encounter  Procedures   TSH Rfx on Abnormal to Free T4   Lipid panel   Comprehensive metabolic panel   VITAMIN D 25 Hydroxy (Vit-D Deficiency, Fractures)   Ambulatory referral to ENT     Return in about 3 months (around 07/09/2021) for Annual physical.     Montel Culver, MD   [place]

## 2021-04-15 ENCOUNTER — Encounter (HOSPITAL_COMMUNITY): Payer: Self-pay | Admitting: Hematology & Oncology

## 2021-04-15 ENCOUNTER — Encounter: Payer: Self-pay | Admitting: Hematology & Oncology

## 2021-04-15 LAB — COMPREHENSIVE METABOLIC PANEL
ALT: 14 IU/L (ref 0–44)
AST: 15 IU/L (ref 0–40)
Albumin/Globulin Ratio: 2.3 — ABNORMAL HIGH (ref 1.2–2.2)
Albumin: 4.6 g/dL (ref 4.0–5.0)
Alkaline Phosphatase: 133 IU/L — ABNORMAL HIGH (ref 44–121)
BUN/Creatinine Ratio: 13 (ref 9–20)
BUN: 17 mg/dL (ref 6–24)
Bilirubin Total: 0.3 mg/dL (ref 0.0–1.2)
CO2: 25 mmol/L (ref 20–29)
Calcium: 9.9 mg/dL (ref 8.7–10.2)
Chloride: 103 mmol/L (ref 96–106)
Creatinine, Ser: 1.26 mg/dL (ref 0.76–1.27)
Globulin, Total: 2 g/dL (ref 1.5–4.5)
Glucose: 89 mg/dL (ref 65–99)
Potassium: 4.9 mmol/L (ref 3.5–5.2)
Sodium: 140 mmol/L (ref 134–144)
Total Protein: 6.6 g/dL (ref 6.0–8.5)
eGFR: 72 mL/min/{1.73_m2} (ref >59)

## 2021-04-15 LAB — LIPID PANEL
Chol/HDL Ratio: 4.1 ratio (ref 0.0–5.0)
Cholesterol, Total: 220 mg/dL — ABNORMAL HIGH (ref 100–199)
HDL: 54 mg/dL (ref >39)
LDL Chol Calc (NIH): 149 mg/dL — ABNORMAL HIGH (ref 0–99)
Triglycerides: 96 mg/dL (ref 0–149)
VLDL Cholesterol Cal: 17 mg/dL (ref 5–40)

## 2021-04-15 LAB — TSH RFX ON ABNORMAL TO FREE T4: TSH: 3.97 u[IU]/mL (ref 0.450–4.500)

## 2021-04-15 LAB — VITAMIN D 25 HYDROXY (VIT D DEFICIENCY, FRACTURES): Vit D, 25-Hydroxy: 30.5 ng/mL (ref 30.0–100.0)

## 2021-04-16 NOTE — Progress Notes (Signed)
Mr. Ebo, labs came back significant for your cholesterol being mildly elevated, based on your other health factors, this is best addressed with healthier lifestyle changes at this stage (reducing saturated fats, sodium, avoiding red meat, fried, processed foods, full-fat dairy, baked goods, and sweets. Incorporate more fruits, vegetables, fiber-rich foods such as whole grains, and transition to more eggs and lean meats.)  Your vitamin D level is low enough that you would recommend a daily over the counter dose of D2 [phone] IU.  Lastly, your alkaline phosphatase is a non-specific marker and is elevated, we will await feedback from oncology and their additional testing before determining any additional labs on our end. Typically I will just recheck this in a few weeks to confirm elevation but as you are seeing them on 10/7, they can advise next steps.  Your thyroid came back normal. Please reach out for any questions.

## 2021-05-01 ENCOUNTER — Encounter: Payer: Self-pay | Admitting: *Deleted

## 2021-05-08 ENCOUNTER — Inpatient Hospital Stay: Payer: BC Managed Care – PPO | Attending: Hematology & Oncology

## 2021-05-08 ENCOUNTER — Encounter: Payer: Self-pay | Admitting: Radiation Oncology

## 2021-05-08 ENCOUNTER — Other Ambulatory Visit: Payer: Self-pay

## 2021-05-08 ENCOUNTER — Ambulatory Visit
Admission: RE | Admit: 2021-05-08 | Discharge: 2021-05-08 | Disposition: A | Discharge status: Home / Self Care | Payer: BC Managed Care – PPO | Source: Ambulatory Visit | Attending: Radiation Oncology | Admitting: Radiation Oncology

## 2021-05-08 VITALS — BP 119/86 | HR 74 | Temp 97.8°F | Wt 210.0 lb

## 2021-05-08 DIAGNOSIS — C8332 Diffuse large B-cell lymphoma, intrathoracic lymph nodes: Secondary | ICD-10-CM | POA: Diagnosis not present

## 2021-05-08 DIAGNOSIS — Z923 Personal history of irradiation: Secondary | ICD-10-CM | POA: Insufficient documentation

## 2021-05-08 DIAGNOSIS — C8582 Other specified types of non-Hodgkin lymphoma, intrathoracic lymph nodes: Secondary | ICD-10-CM | POA: Diagnosis not present

## 2021-05-08 DIAGNOSIS — Z452 Encounter for adjustment and management of vascular access device: Secondary | ICD-10-CM | POA: Diagnosis not present

## 2021-05-08 DIAGNOSIS — Z95828 Presence of other vascular implants and grafts: Secondary | ICD-10-CM

## 2021-05-08 MED ORDER — HEPARIN SOD (PORK) LOCK FLUSH 100 UNIT/ML IV SOLN
500.0000 [IU] | Freq: Once | INTRAVENOUS | Status: AC
  Administered 2021-05-08: 500 [IU] via INTRAVENOUS
  Filled 2021-05-08: qty 5

## 2021-05-08 MED ORDER — SODIUM CHLORIDE 0.9% FLUSH
10.0000 mL | Freq: Once | INTRAVENOUS | Status: AC
Start: 2021-05-08 — End: 2021-05-08
  Administered 2021-05-08: 10 mL via INTRAVENOUS
  Filled 2021-05-08: qty 10

## 2021-05-08 NOTE — Progress Notes (Signed)
Radiation Oncology Follow up Note  Name: Travis Duran   Date:   05/08/2021 MRN:  [id number] DOB: 07/05/1976    This 45 y.o. male presents to the clinic today for 56-month follow-up status post involved field radiation therapy to his mediastinum for a large B-cell lymphoma status post 6 cycles of R-EPOCH.  REFERRING PROVIDER: Laurey Morale, MD  HPI: Patient is a 45 year old male now about 6 months having completed involved field radiation therapy to the mediastinum for a large B-cell lymphoma status post 6 cycles of R-EPOCH.  Seen today in routine follow-up he is doing well.  Specifically [place] fever chills night sweats.  He is running close to 10 miles without significant side effects such as shortness of breath cough or dyspnea on exertion..  He had a PET scan back in August which showed decreased size and similar hypermetabolic activity anterior mediastinum although on my evaluation this is almost similar to background blood pool level.  He has a's follow-up PET CT scan in December.  COMPLICATIONS OF TREATMENT: none  FOLLOW UP COMPLIANCE: keeps appointments   PHYSICAL EXAM:  BP 119/86   Pulse 74   Temp 97.8 F (36.6 C) (Tympanic)   Wt 210 lb (95.3 kg)   BMI 31.01 kg/m  No peripheral adenopathy is identified.  Well-developed well-nourished patient in NAD. HEENT reveals PERLA, EOMI, discs not visualized.  Oral cavity is clear. No oral mucosal lesions are identified. Neck is clear without evidence of cervical or supraclavicular adenopathy. Lungs are clear to A&P. Cardiac examination is essentially unremarkable with regular rate and rhythm without murmur rub or thrill. Abdomen is benign with no organomegaly or masses noted. Motor sensory and DTR levels are equal and symmetric in the upper and lower extremities. Cranial nerves II through XII are grossly intact. Proprioception is intact. No peripheral adenopathy or edema is identified. No motor or sensory levels are noted. Crude visual fields are  within normal range.  RADIOLOGY RESULTS: PET CT scan reviewed compatible with above-stated findings and compared to previous studies  PLAN: Present time patient is doing well in excellent general overall condition.  No significant side effects.  Pleased with his overall progress.  He will have a PET CT scan in December which I will review when it becomes available.  I have asked to see him back in 6 months for follow-up.  He continues close follow-up care with Dr. Marin Olp.  Patient knows to call with any concerns.  I would like to take this opportunity to thank you for allowing me to participate in the care of your patient.Noreene Filbert, MD

## 2021-05-22 ENCOUNTER — Encounter: Payer: Self-pay | Admitting: *Deleted

## 2021-06-02 ENCOUNTER — Encounter: Payer: Self-pay | Admitting: *Deleted

## 2021-06-02 ENCOUNTER — Encounter: Payer: Self-pay | Admitting: Hematology & Oncology

## 2021-06-02 ENCOUNTER — Encounter (HOSPITAL_COMMUNITY): Payer: Self-pay | Admitting: Hematology & Oncology

## 2021-06-02 NOTE — Progress Notes (Signed)
PET scan scheduled for 07/06/2021. MyChart message sent to patient with appointment information and prep information.   Oncology Nurse Navigator Documentation  Oncology Nurse Navigator Flowsheets 06/02/2021  Abnormal Finding Date -  Confirmed Diagnosis Date -  Diagnosis Status -  Planned Course of Treatment -  Phase of Treatment -  Chemotherapy Actual Start Date: -  Radiation Pending-Reason: -  Radiation Actual Start Date: -  Radiation Expected End Date: -  Radiation Actual End Date: -  Navigator Follow Up Date: 07/06/2021  Navigator Follow Up Reason: Scan Review  Navigator Location [facility]  Navigator Encounter Type Appt/Treatment Plan Review;MyChart  Telephone -  Treatment Initiated Date -  Patient Visit Type MedOnc  Treatment Phase Post-Tx Follow-up  Barriers/Navigation Needs Coordination of Care;Education  Education Other  Interventions Coordination of Care;Education  Acuity Level 2-Minimal Needs (1-2 Barriers Identified)  Coordination of Care Appts;Radiology  Education Method Written  Support Groups/Services Friends and Family  Time Spent with Patient 30

## 2021-07-06 ENCOUNTER — Ambulatory Visit
Admission: RE | Admit: 2021-07-06 | Discharge: 2021-07-06 | Disposition: A | Discharge status: Home / Self Care | Payer: BC Managed Care – PPO | Source: Ambulatory Visit | Attending: Hematology & Oncology | Admitting: Hematology & Oncology

## 2021-07-06 DIAGNOSIS — C8582 Other specified types of non-Hodgkin lymphoma, intrathoracic lymph nodes: Secondary | ICD-10-CM | POA: Diagnosis present

## 2021-07-06 LAB — GLUCOSE, CAPILLARY: Glucose-Capillary: 83 mg/dL (ref 70–99)

## 2021-07-06 MED ORDER — FLUDEOXYGLUCOSE F - 18 (FDG) INJECTION
10.9000 | Freq: Once | INTRAVENOUS | Status: AC | PRN
  Administered 2021-07-06: 11.2 via INTRAVENOUS

## 2021-07-07 ENCOUNTER — Telehealth: Payer: Self-pay | Admitting: *Deleted

## 2021-07-07 ENCOUNTER — Encounter: Payer: Self-pay | Admitting: *Deleted

## 2021-07-07 NOTE — Progress Notes (Signed)
Oncology Nurse Navigator Documentation  Oncology Nurse Navigator Flowsheets 07/07/2021  Abnormal Finding Date -  Confirmed Diagnosis Date -  Diagnosis Status -  Planned Course of Treatment -  Phase of Treatment -  Chemotherapy Actual Start Date: -  Radiation Pending-Reason: -  Radiation Actual Start Date: -  Radiation Expected End Date: -  Radiation Actual End Date: -  Navigator Follow Up Date: 07/10/2021  Navigator Follow Up Reason: Follow-up Appointment  Navigator Location [facility]  Navigator Encounter Type Scan Review  Telephone -  Treatment Initiated Date -  Patient Visit Type MedOnc  Treatment Phase Follow-up  Barriers/Navigation Needs Coordination of Care;Education  Education -  Interventions None Required  Acuity Level 2-Minimal Needs (1-2 Barriers Identified)  Coordination of Care -  Education Method -  Support Groups/Services Friends and Family  Time Spent with Patient 15

## 2021-07-07 NOTE — Telephone Encounter (Signed)
As noted below by Dr. Marin Olp, I informed the patient

## 2021-07-07 NOTE — Telephone Encounter (Signed)
As noted below by Dr. Marin Olp, I informed him that the PET scan looks fantastic. Everything looks stable. There is nothing that looks enlarged or with more activity. You are still in remission. He verbalized understanding.

## 2021-07-07 NOTE — Telephone Encounter (Signed)
-----   Message from Volanda Napoleon, MD sent at 07/07/2021 11:37 AM EST ----- Please call and tell him that the PET scan looks fantastic.  Everything looks very stable.  There is nothing that looks enlarged or with more activity.  We are still in remission.  Mary Christmas.  Laurey Arrow

## 2021-07-10 ENCOUNTER — Inpatient Hospital Stay (HOSPITAL_BASED_OUTPATIENT_CLINIC_OR_DEPARTMENT_OTHER): Payer: BC Managed Care – PPO | Admitting: Hematology & Oncology

## 2021-07-10 ENCOUNTER — Inpatient Hospital Stay: Payer: BC Managed Care – PPO | Attending: Hematology & Oncology

## 2021-07-10 ENCOUNTER — Inpatient Hospital Stay: Payer: BC Managed Care – PPO

## 2021-07-10 ENCOUNTER — Encounter: Payer: Self-pay | Admitting: Hematology & Oncology

## 2021-07-10 ENCOUNTER — Encounter: Payer: Self-pay | Admitting: *Deleted

## 2021-07-10 ENCOUNTER — Other Ambulatory Visit: Payer: Self-pay

## 2021-07-10 VITALS — BP 117/76 | HR 76 | Temp 98.5°F | Resp 18 | Wt 209.0 lb

## 2021-07-10 DIAGNOSIS — Z452 Encounter for adjustment and management of vascular access device: Secondary | ICD-10-CM | POA: Insufficient documentation

## 2021-07-10 DIAGNOSIS — Z86711 Personal history of pulmonary embolism: Secondary | ICD-10-CM | POA: Diagnosis not present

## 2021-07-10 DIAGNOSIS — Z95828 Presence of other vascular implants and grafts: Secondary | ICD-10-CM

## 2021-07-10 DIAGNOSIS — Z8572 Personal history of non-Hodgkin lymphomas: Secondary | ICD-10-CM | POA: Insufficient documentation

## 2021-07-10 DIAGNOSIS — C8582 Other specified types of non-Hodgkin lymphoma, intrathoracic lymph nodes: Secondary | ICD-10-CM | POA: Diagnosis not present

## 2021-07-10 LAB — CBC WITH DIFFERENTIAL (CANCER CENTER ONLY)
Abs Immature Granulocytes: 0.03 10*3/uL (ref 0.00–0.07)
Basophils Absolute: 0 10*3/uL (ref 0.0–0.1)
Basophils Relative: 0 %
Eosinophils Absolute: 0.3 10*3/uL (ref 0.0–0.5)
Eosinophils Relative: 4 %
HCT: 38.4 % — ABNORMAL LOW (ref 39.0–52.0)
Hemoglobin: 13.3 g/dL (ref 13.0–17.0)
Immature Granulocytes: 0 %
Lymphocytes Relative: 12 %
Lymphs Abs: 0.8 10*3/uL (ref 0.7–4.0)
MCH: 30.3 pg (ref 26.0–34.0)
MCHC: 34.6 g/dL (ref 30.0–36.0)
MCV: 87.5 fL (ref 80.0–100.0)
Monocytes Absolute: 0.7 10*3/uL (ref 0.1–1.0)
Monocytes Relative: 10 %
Neutro Abs: 5.3 10*3/uL (ref 1.7–7.7)
Neutrophils Relative %: 74 %
Platelet Count: 255 10*3/uL (ref 150–400)
RBC: 4.39 MIL/uL (ref 4.22–5.81)
RDW: 13.5 % (ref 11.5–15.5)
WBC Count: 7.2 10*3/uL (ref 4.0–10.5)
nRBC: 0 % (ref 0.0–0.2)

## 2021-07-10 LAB — CMP (CANCER CENTER ONLY)
ALT: 12 U/L (ref 0–44)
AST: 13 U/L — ABNORMAL LOW (ref 15–41)
Albumin: 4.2 g/dL (ref 3.5–5.0)
Alkaline Phosphatase: 97 U/L (ref 38–126)
Anion gap: 7 (ref 5–15)
BUN: 15 mg/dL (ref 6–20)
CO2: 27 mmol/L (ref 22–32)
Calcium: 9.9 mg/dL (ref 8.9–10.3)
Chloride: 104 mmol/L (ref 98–111)
Creatinine: 1.15 mg/dL (ref 0.61–1.24)
GFR, Estimated: >60 mL/min (ref >60)
Glucose, Bld: 92 mg/dL (ref 70–99)
Potassium: 3.9 mmol/L (ref 3.5–5.1)
Sodium: 138 mmol/L (ref 135–145)
Total Bilirubin: 0.4 mg/dL (ref 0.3–1.2)
Total Protein: 6.8 g/dL (ref 6.5–8.1)

## 2021-07-10 LAB — LACTATE DEHYDROGENASE: LDH: 149 U/L (ref 98–192)

## 2021-07-10 MED ORDER — HEPARIN SOD (PORK) LOCK FLUSH 100 UNIT/ML IV SOLN
500.0000 [IU] | Freq: Once | INTRAVENOUS | Status: AC
  Administered 2021-07-10: 500 [IU] via INTRAVENOUS

## 2021-07-10 MED ORDER — SODIUM CHLORIDE 0.9% FLUSH
10.0000 mL | Freq: Once | INTRAVENOUS | Status: AC
  Administered 2021-07-10: 10 mL via INTRAVENOUS

## 2021-07-10 NOTE — Progress Notes (Signed)
Hematology and Oncology Follow Up Visit  Travis Duran [id number] 1976-05-27 45 y.o. 07/10/2021   Principle Diagnosis:  Mediastinal Large B-cell NHL -- (+) pleural effusion Pulmonary embolism-bilateral  Current Therapy:   S/p cycle #6 of R-EPOCH -- completed in 08/2020 Xarelto 10 mg p.o. daily --complete therapy on 07/2021  Consolidative radiation therapy to the residual mediastinal mass-to finish radiation therapy on 11/06/2020     Interim History:  Travis Duran is back for follow-up.  He comes in with his wife.  He looks fantastic.  He actually ran a half marathon.  I think this was open [place].  He has a new job.  He will start a new job at [employer].  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  We did do a PET scan on him.  This was done last week.  The PET scan showed minimal stable uptake in the mediastinum.  However, in the lower esophagus there was increased SUV of 6.21.  There was some slight thickening of the distal esophagus.  Again, we will have to get an upper endoscopy on him.  He has had no problems swallowing.  There is no dyspepsia.  He has had no change in bowel or bladder habits.  There has been no bleeding.  Overall, I would have to say that his performance status is ECOG 0.    Medications:  Current Outpatient Medications:    Melatonin 3 MG CAPS, Take 3 mg by mouth at bedtime., Disp: , Rfl:    rivaroxaban (XARELTO) 10 MG TABS tablet, Take 1 tablet (10 mg total) by mouth daily., Disp: 30 tablet, Rfl: 3   tadalafil (CIALIS) 10 MG tablet, Take 1 tablet (10 mg total) by mouth daily as needed for erectile dysfunction., Disp: 10 tablet, Rfl: 11  Allergies: No Known Allergies  Past Medical History, Surgical history, Social history, and Family History were reviewed and updated.  Review of Systems: Review of Systems  Constitutional:  Negative for unexpected weight change.  HENT:   Negative for mouth sores.   Eyes: Negative.   Respiratory:  Positive for chest  tightness, cough and shortness of breath.   Cardiovascular: Negative.   Gastrointestinal: Negative.   Endocrine: Negative.   Genitourinary: Negative.    Musculoskeletal: Negative.   Skin: Negative.   Neurological: Negative.   Hematological: Negative.   Psychiatric/Behavioral: Negative.     Physical Exam:  weight is 209 lb (94.8 kg). His oral temperature is 98.5 F (36.9 C). His blood pressure is 117/76 and his pulse is 76. His respiration is 18 and oxygen saturation is 99%.   Wt Readings from Last 3 Encounters:  07/10/21 209 lb (94.8 kg)  05/08/21 210 lb (95.3 kg)  04/09/21 211 lb (95.7 kg)    Physical Exam Vitals reviewed.  HENT:     Head: Normocephalic and atraumatic.  Eyes:     Pupils: Pupils are equal, round, and reactive to light.  Cardiovascular:     Rate and Rhythm: Regular rhythm. Tachycardia present.     Heart sounds: Normal heart sounds.     Comments: Cardiac exam is regular rate and rhythm.  There are no murmurs, rubs or bruits.   Pulmonary:     Comments: Lungs are clear bilaterally.  He has good air movement bilaterally.  There are no rales, wheezes or rhonchi.    Abdominal:     General: Bowel sounds are normal.     Palpations: Abdomen is soft.  Musculoskeletal:  General: No tenderness or deformity. Normal range of motion.     Cervical back: Normal range of motion.  Lymphadenopathy:     Cervical: No cervical adenopathy.  Skin:    General: Skin is warm and dry.     Findings: No erythema or rash.  Neurological:     Mental Status: He is alert and oriented to person, place, and time.  Psychiatric:        Behavior: Behavior normal.        Thought Content: Thought content normal.        Judgment: Judgment normal.   Lab Results  Component Value Date   WBC 7.2 07/10/2021   HGB 13.3 07/10/2021   HCT 38.4 (L) 07/10/2021   MCV 87.5 07/10/2021   PLT 255 07/10/2021     Chemistry      Component Value Date/Time   NA 140 04/14/2021 0853   K 4.9  04/14/2021 0853   CL 103 04/14/2021 0853   CO2 25 04/14/2021 0853   BUN 17 04/14/2021 0853   CREATININE 1.26 04/14/2021 0853   CREATININE 1.09 03/11/2021 0807      Component Value Date/Time   CALCIUM 9.9 04/14/2021 0853   ALKPHOS 133 (H) 04/14/2021 0853   AST 15 04/14/2021 0853   AST 13 (L) 03/11/2021 0807   ALT 14 04/14/2021 0853   ALT 13 03/11/2021 0807   BILITOT 0.3 04/14/2021 0853   BILITOT 0.6 03/11/2021 0807      Impression and Plan: Travis Duran is a really nice 45 year old white male.  He presented with a massive mediastinal tumor.  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  This is a B-cell lymphoma.  We treated him with 6 cycles of chemotherapy with infusional R-EPOCH.  He did well with this.  Had a good response.  We then went ahead and gave him some consolidative radiation therapy because he had such a large mediastinal mass.  I reviewed about the PET scan with the mediastinum.  However, I am not sure exactly what is going on with the esophagus.  Again, we will have to see about getting an upper endoscopy on him and biopsying this area of activity.  Otherwise, medically we need another PET scan probably until April.  We will get him through the Christmas and Winter.  He has been on Xarelto for a couple years.  I think we can probably stop the Xarelto.  I think he still has his Port-A-Cath in.  It be nice to get the Port-A-Cath taken out but I want to make sure that we follow-up with this endoscopy.  Again I would like to see him back in April.  If there is anything that we see on the endoscopy, then we will get him back sooner.   Volanda Napoleon, MD 12/9/20229:51 AM

## 2021-07-10 NOTE — Progress Notes (Signed)
Patient continues to have esophageal activity on his PET and will need an endoscopy. He will also need an addition PET in March 2023.   Referral order for GI placed. Will scheduled PET closer to requested date.   Oncology Nurse Navigator Documentation  Oncology Nurse Navigator Flowsheets 07/10/2021  Abnormal Finding Date -  Confirmed Diagnosis Date -  Diagnosis Status -  Planned Course of Treatment -  Phase of Treatment -  Chemotherapy Actual Start Date: -  Radiation Pending-Reason: -  Radiation Actual Start Date: -  Radiation Expected End Date: -  Radiation Actual End Date: -  Navigator Follow Up Date: 07/15/2021  Navigator Follow Up Reason: Appointment Review  Navigator Location [facility]  Navigator Encounter Type Follow-up Appt;Appt/Treatment Plan Review  Telephone -  Treatment Initiated Date -  Patient Visit Type MedOnc  Treatment Phase Follow-up  Barriers/Navigation Needs Coordination of Care  Education -  Interventions Referrals  Acuity Level 2-Minimal Needs (1-2 Barriers Identified)  Referrals Other  Coordination of Care -  Education Method -  Support Groups/Services Friends and Family  Time Spent with Patient 30

## 2021-07-10 NOTE — Patient Instructions (Signed)

## 2021-07-13 ENCOUNTER — Encounter: Payer: Self-pay | Admitting: Gastroenterology

## 2021-07-13 ENCOUNTER — Ambulatory Visit (INDEPENDENT_AMBULATORY_CARE_PROVIDER_SITE_OTHER): Payer: BC Managed Care – PPO | Admitting: Gastroenterology

## 2021-07-13 ENCOUNTER — Other Ambulatory Visit: Payer: Self-pay

## 2021-07-13 VITALS — BP 115/79 | HR 85 | Temp 97.9°F | Ht 69.0 in | Wt 208.2 lb

## 2021-07-13 DIAGNOSIS — R948 Abnormal results of function studies of other organs and systems: Secondary | ICD-10-CM | POA: Diagnosis not present

## 2021-07-13 NOTE — H&P (View-Only) (Signed)
Gastroenterology Consultation  Referring Provider:     Montel Culver, MD Primary Care Physician:  Montel Culver, MD Primary Gastroenterologist:  Dr. Allen Norris     Reason for Consultation:     Abnormal PET scan        HPI:   Travis Duran is a 45 y.o. y/o male referred for consultation & management of Abnormal PET scan by Dr. Zigmund Daniel, Earley Abide, MD.  This patient was sent to me due to a report of having large cell B lymphoma with intrathoracic lymph nodes be enlarged and a PET scan that showed the esophagus light up. The scan was reported as:  IMPRESSION: 1. Similar appearance of residual anterior mediastinal soft tissue density with mild FDG uptake (Deauville criteria 3). 2. No new sites of disease. 3. As noted previously there is distal esophageal and proximal gastric hypermetabolism. Correlate for clinical signs or symptoms of esophagitis and or gastritis.  The patient states that he stopped his blood thinners last week.  He denies any other GI symptoms.  He denies any family history of colon cancer or colon polyps.  He also denies any change in bowel habits.  Past Medical History:  Diagnosis Date   Arthritis of right knee    ED (erectile dysfunction)    Lymphoma, large cell, intrathoracic lymph nodes ([place]) 05/01/2020   OSA (obstructive sleep apnea) 2007   with AHI 32/hr   Pulmonary embolism ([place]) 07/02/2020    Past Surgical History:  Procedure Laterality Date   BRONCHIAL WASHINGS  07/03/2020   Procedure: BRONCHIAL WASHINGS;  Surgeon: Spero Geralds, MD;  Location: [facility];  Service: Pulmonary;;   burn to left hand     skin graphs   FINE NEEDLE ASPIRATION  04/25/2020   Procedure: FINE NEEDLE ASPIRATION (FNA) LINEAR;  Surgeon: Candee Furbish, MD;  Location: [place];  Service: Pulmonary;;   IR IMAGING GUIDED PORT INSERTION  05/07/2020   IR THORACENTESIS ASP PLEURAL SPACE W/IMG GUIDE  04/25/2020   IR THORACENTESIS ASP PLEURAL SPACE W/IMG GUIDE  05/07/2020    REFRACTIVE SURGERY  09/2012   bilateral    THORACENTESIS  04/25/2020   Procedure: THORACENTESIS;  Surgeon: Candee Furbish, MD;  Location: [place];  Service: Pulmonary;;   VIDEO BRONCHOSCOPY N/A 07/03/2020   Procedure: VIDEO BRONCHOSCOPY WITHOUT FLUORO;  Surgeon: Spero Geralds, MD;  Location: [facility];  Service: Pulmonary;  Laterality: N/A;   VIDEO BRONCHOSCOPY WITH ENDOBRONCHIAL ULTRASOUND N/A 04/25/2020   Procedure: VIDEO BRONCHOSCOPY WITH ENDOBRONCHIAL ULTRASOUND;  Surgeon: Candee Furbish, MD;  Location: [facility];  Service: Pulmonary;  Laterality: N/A;    Prior to Admission medications   Medication Sig Start Date End Date Taking? Authorizing Provider  Melatonin 3 MG CAPS Take 3 mg by mouth at bedtime.    [provider]  rivaroxaban (XARELTO) 10 MG TABS tablet Take 1 tablet (10 mg total) by mouth daily. 02/11/21   Volanda Napoleon, MD  tadalafil (CIALIS) 10 MG tablet Take 1 tablet (10 mg total) by mouth daily as needed for erectile dysfunction. 04/06/19   Laurey Morale, MD  apixaban (ELIQUIS) 5 MG TABS tablet Take 1 tablet (5 mg total) by mouth 2 (two) times daily. Patient not taking: No sig reported 08/12/20 08/29/20  Volanda Napoleon, MD    Family History  Problem Relation Age of Onset   COPD Father    Emphysema Father    Hypertension Father    COPD Sister  Sleep apnea Other    Hypertension Other      Social History   Tobacco Use   Smoking status: Never   Smokeless tobacco: Never  Vaping Use   Vaping Use: Never used  Substance Use Topics   Alcohol use: Yes   Drug use: Never    Allergies as of 07/13/2021   (No Known Allergies)    Review of Systems:    All systems reviewed and negative except where noted in HPI.   Physical Exam:  There were no vitals taken for this visit. No LMP for male patient. General:   Alert,  Well-developed, well-nourished, pleasant and cooperative in NAD Head:  Normocephalic and atraumatic. Eyes:  Sclera clear, no  icterus.   Conjunctiva pink. Ears:  Normal auditory acuity. Neck:  Supple; no masses or thyromegaly. Lungs:  Respirations even and unlabored.  Clear throughout to auscultation.   No wheezes, crackles, or rhonchi. No acute distress. Heart:  Regular rate and rhythm; no murmurs, clicks, rubs, or gallops. Abdomen:  Normal bowel sounds.  No bruits.  Soft, non-tender and non-distended without masses, hepatosplenomegaly or hernias noted.  No guarding or rebound tenderness.  Negative Carnett sign.   Rectal:  Deferred.  Pulses:  Normal pulses noted. Extremities:  No clubbing or edema.  No cyanosis. Neurologic:  Alert and oriented x3;  grossly normal neurologically. Skin:  Intact without significant lesions or rashes.  No jaundice. Lymph Nodes:  No significant cervical adenopathy. Psych:  Alert and cooperative. Normal mood and affect.  Imaging Studies: NM PET Image Restag (PS) Skull Base To Thigh  Result Date: 07/07/2021 CLINICAL DATA:  Subsequent treatment strategy for large cell lymphoma. EXAM: NUCLEAR MEDICINE PET SKULL BASE TO THIGH TECHNIQUE: 11.2 mCi F-18 FDG was injected intravenously. Full-ring PET imaging was performed from the skull base to thigh after the radiotracer. CT data was obtained and used for attenuation correction and anatomic localization. Fasting blood glucose: 83 mg/dl COMPARISON:  03/09/2021. FINDINGS: Mediastinal blood pool activity: SUV max 2.98 Liver activity: SUV max 3.48 NECK: No hypermetabolic lymph nodes in the neck. Incidental CT findings: none CHEST: Previous anterior mediastinal, prevascular soft tissue measures 2.3 x 5.9 cm with SUV max of 3.17, image 95/3. On the previous exam this measured 2.5 by 5.9 cm and had an SUV max of 3.01. (Deauville criteria 3). No new FDG avid mass or adenopathy identified. No FDG avid pulmonary nodules. Incidental CT findings: Unchanged appearance of low-attenuation nodule arising off the lower pole of left lobe of thyroid gland measuring 1.8  cm, image 76/3. This has been evaluated on previous imaging. (ref: J Am Coll Radiol. 2015 Feb;12(2): 143-50). Paramediastinal radiation change identified within the left upper lobe and superior segment of left lower lobe, image 100/3. ABDOMEN/PELVIS: No abnormal hypermetabolic activity within the liver, pancreas, adrenal glands, or spleen. No hypermetabolic lymph nodes in the abdomen or pelvis. Increased tracer uptake within the proximal stomach and distal esophagus is again noted. SUV max is equal to 6.21 on today's study compared with 4.36 previously. Mild circumferential wall thickening of the distal esophagus is noted with increased uptake with SUV max of 4.53. Incidental CT findings: None. SKELETON: No focal hypermetabolic activity to suggest skeletal metastasis. Incidental CT findings: none IMPRESSION: 1. Similar appearance of residual anterior mediastinal soft tissue density with mild FDG uptake (Deauville criteria 3). 2. No new sites of disease. 3. As noted previously there is distal esophageal and proximal gastric hypermetabolism. Correlate for clinical signs or symptoms of esophagitis and or  gastritis. Electronically Signed   By: Kerby Moors M.D.   On: 07/07/2021 09:27    Assessment and Plan:   Travis Duran is a 45 y.o. y/o male who comes in today with a abnormal PET scan showing enhancement in the distal esophagus.  The patient has a history of large B-cell lymphoma.  The patient will be set up for an upper endoscopy to look at this area.  The patient has also been offered a colonoscopy at the same time since he is 45 years old and has not had a screening colonoscopy.  The patient has been told the risks and benefits of having a procedure done and states that he is gone through enough in the last year and would like to hold off on any coloscopic intervention at this time.  The patient will follow up at the time of the upper endoscopy.    Lucilla Lame, MD. Marval Regal    Note: This dictation was  prepared with Dragon dictation along with smaller phrase technology. Any transcriptional errors that result from this process are unintentional.

## 2021-07-13 NOTE — Progress Notes (Signed)
Gastroenterology Consultation  Referring Provider:     Montel Culver, MD Primary Care Physician:  Montel Culver, MD Primary Gastroenterologist:  Dr. Allen Norris     Reason for Consultation:     Abnormal PET scan        HPI:   Travis Duran is a 45 y.o. y/o male referred for consultation & management of Abnormal PET scan by Dr. Zigmund Daniel, Earley Abide, MD.  This patient was sent to me due to a report of having large cell B lymphoma with intrathoracic lymph nodes be enlarged and a PET scan that showed the esophagus light up. The scan was reported as:  IMPRESSION: 1. Similar appearance of residual anterior mediastinal soft tissue density with mild FDG uptake (Deauville criteria 3). 2. No new sites of disease. 3. As noted previously there is distal esophageal and proximal gastric hypermetabolism. Correlate for clinical signs or symptoms of esophagitis and or gastritis.  The patient states that he stopped his blood thinners last week.  He denies any other GI symptoms.  He denies any family history of colon cancer or colon polyps.  He also denies any change in bowel habits.  Past Medical History:  Diagnosis Date   Arthritis of right knee    ED (erectile dysfunction)    Lymphoma, large cell, intrathoracic lymph nodes ([place]) 05/01/2020   OSA (obstructive sleep apnea) 2007   with AHI 32/hr   Pulmonary embolism ([place]) 07/02/2020    Past Surgical History:  Procedure Laterality Date   BRONCHIAL WASHINGS  07/03/2020   Procedure: BRONCHIAL WASHINGS;  Surgeon: Spero Geralds, MD;  Location: [facility];  Service: Pulmonary;;   burn to left hand     skin graphs   FINE NEEDLE ASPIRATION  04/25/2020   Procedure: FINE NEEDLE ASPIRATION (FNA) LINEAR;  Surgeon: Candee Furbish, MD;  Location: [place];  Service: Pulmonary;;   IR IMAGING GUIDED PORT INSERTION  05/07/2020   IR THORACENTESIS ASP PLEURAL SPACE W/IMG GUIDE  04/25/2020   IR THORACENTESIS ASP PLEURAL SPACE W/IMG GUIDE  05/07/2020    REFRACTIVE SURGERY  09/2012   bilateral    THORACENTESIS  04/25/2020   Procedure: THORACENTESIS;  Surgeon: Candee Furbish, MD;  Location: [place];  Service: Pulmonary;;   VIDEO BRONCHOSCOPY N/A 07/03/2020   Procedure: VIDEO BRONCHOSCOPY WITHOUT FLUORO;  Surgeon: Spero Geralds, MD;  Location: [facility];  Service: Pulmonary;  Laterality: N/A;   VIDEO BRONCHOSCOPY WITH ENDOBRONCHIAL ULTRASOUND N/A 04/25/2020   Procedure: VIDEO BRONCHOSCOPY WITH ENDOBRONCHIAL ULTRASOUND;  Surgeon: Candee Furbish, MD;  Location: [facility] END[facility]COPY;  Service: Pulmonary;  Laterality: N/A;    Prior to Admission medications   Medication Sig Start Date End Date Taking? Authorizing Provider  Melatonin 3 MG CAPS Take 3 mg by mouth at bedtime.    [provider]  rivaroxaban (XARELTO) 10 MG TABS tablet Take 1 tablet (10 mg total) by mouth daily. 02/11/21   Volanda Napoleon, MD  tadalafil (CIALIS) 10 MG tablet Take 1 tablet (10 mg total) by mouth daily as needed for erectile dysfunction. 04/06/19   Laurey Morale, MD  apixaban (ELIQUIS) 5 MG TABS tablet Take 1 tablet (5 mg total) by mouth 2 (two) times daily. Patient not taking: No sig reported 08/12/20 08/29/20  Volanda Napoleon, MD    Family History  Problem Relation Age of Onset   COPD Father    Emphysema Father    Hypertension Father    COPD Sister  Sleep apnea Other    Hypertension Other      Social History   Tobacco Use   Smoking status: Never   Smokeless tobacco: Never  Vaping Use   Vaping Use: Never used  Substance Use Topics   Alcohol use: Yes   Drug use: Never    Allergies as of 07/13/2021   (No Known Allergies)    Review of Systems:    All systems reviewed and negative except where noted in HPI.   Physical Exam:  There were no vitals taken for this visit. No LMP for male patient. General:   Alert,  Well-developed, well-nourished, pleasant and cooperative in NAD Head:  Normocephalic and atraumatic. Eyes:  Sclera clear, no  icterus.   Conjunctiva pink. Ears:  Normal auditory acuity. Neck:  Supple; no masses or thyromegaly. Lungs:  Respirations even and unlabored.  Clear throughout to auscultation.   No wheezes, crackles, or rhonchi. No acute distress. Heart:  Regular rate and rhythm; no murmurs, clicks, rubs, or gallops. Abdomen:  Normal bowel sounds.  No bruits.  Soft, non-tender and non-distended without masses, hepatosplenomegaly or hernias noted.  No guarding or rebound tenderness.  Negative Carnett sign.   Rectal:  Deferred.  Pulses:  Normal pulses noted. Extremities:  No clubbing or edema.  No cyanosis. Neurologic:  Alert and oriented x3;  grossly normal neurologically. Skin:  Intact without significant lesions or rashes.  No jaundice. Lymph Nodes:  No significant cervical adenopathy. Psych:  Alert and cooperative. Normal mood and affect.  Imaging Studies: NM PET Image Restag (PS) Skull Base To Thigh  Result Date: 07/07/2021 CLINICAL DATA:  Subsequent treatment strategy for large cell lymphoma. EXAM: NUCLEAR MEDICINE PET SKULL BASE TO THIGH TECHNIQUE: 11.2 mCi F-18 FDG was injected intravenously. Full-ring PET imaging was performed from the skull base to thigh after the radiotracer. CT data was obtained and used for attenuation correction and anatomic localization. Fasting blood glucose: 83 mg/dl COMPARISON:  03/09/2021. FINDINGS: Mediastinal blood pool activity: SUV max 2.98 Liver activity: SUV max 3.48 NECK: No hypermetabolic lymph nodes in the neck. Incidental CT findings: none CHEST: Previous anterior mediastinal, prevascular soft tissue measures 2.3 x 5.9 cm with SUV max of 3.17, image 95/3. On the previous exam this measured 2.5 by 5.9 cm and had an SUV max of 3.01. (Deauville criteria 3). No new FDG avid mass or adenopathy identified. No FDG avid pulmonary nodules. Incidental CT findings: Unchanged appearance of low-attenuation nodule arising off the lower pole of left lobe of thyroid gland measuring 1.8  cm, image 76/3. This has been evaluated on previous imaging. (ref: J Am Coll Radiol. 2015 Feb;12(2): 143-50). Paramediastinal radiation change identified within the left upper lobe and superior segment of left lower lobe, image 100/3. ABDOMEN/PELVIS: No abnormal hypermetabolic activity within the liver, pancreas, adrenal glands, or spleen. No hypermetabolic lymph nodes in the abdomen or pelvis. Increased tracer uptake within the proximal stomach and distal esophagus is again noted. SUV max is equal to 6.21 on today's study compared with 4.36 previously. Mild circumferential wall thickening of the distal esophagus is noted with increased uptake with SUV max of 4.53. Incidental CT findings: None. SKELETON: No focal hypermetabolic activity to suggest skeletal metastasis. Incidental CT findings: none IMPRESSION: 1. Similar appearance of residual anterior mediastinal soft tissue density with mild FDG uptake (Deauville criteria 3). 2. No new sites of disease. 3. As noted previously there is distal esophageal and proximal gastric hypermetabolism. Correlate for clinical signs or symptoms of esophagitis and or  gastritis. Electronically Signed   By: Kerby Moors M.D.   On: 07/07/2021 09:27    Assessment and Plan:   Mika Anastasi is a 45 y.o. y/o male who comes in today with a abnormal PET scan showing enhancement in the distal esophagus.  The patient has a history of large B-cell lymphoma.  The patient will be set up for an upper endoscopy to look at this area.  The patient has also been offered a colonoscopy at the same time since he is 45 years old and has not had a screening colonoscopy.  The patient has been told the risks and benefits of having a procedure done and states that he is gone through enough in the last year and would like to hold off on any coloscopic intervention at this time.  The patient will follow up at the time of the upper endoscopy.    Lucilla Lame, MD. Marval Regal    Note: This dictation was  prepared with Dragon dictation along with smaller phrase technology. Any transcriptional errors that result from this process are unintentional.

## 2021-07-14 ENCOUNTER — Ambulatory Visit: Payer: BC Managed Care – PPO | Admitting: Family Medicine

## 2021-07-15 ENCOUNTER — Encounter: Payer: Self-pay | Admitting: *Deleted

## 2021-07-15 NOTE — Progress Notes (Signed)
Patient is scheduled for EGD on 07/17/2021 to evaluate PET findings.   Oncology Nurse Navigator Documentation  Oncology Nurse Navigator Flowsheets 07/15/2021  Abnormal Finding Date -  Confirmed Diagnosis Date -  Diagnosis Status -  Planned Course of Treatment -  Phase of Treatment -  Chemotherapy Actual Start Date: -  Radiation Pending-Reason: -  Radiation Actual Start Date: -  Radiation Expected End Date: -  Radiation Actual End Date: -  Navigator Follow Up Date: 07/17/2021  Navigator Follow Up Reason: Review Note  Navigator Location [facility]  Navigator Encounter Type Appt/Treatment Plan Review  Telephone -  Treatment Initiated Date -  Patient Visit Type MedOnc  Treatment Phase Follow-up  Barriers/Navigation Needs Coordination of Care  Education -  Interventions None Required  Acuity Level 2-Minimal Needs (1-2 Barriers Identified)  Referrals -  Coordination of Care -  Education Method -  Support Groups/Services Friends and Family  Time Spent with Patient 15

## 2021-07-17 ENCOUNTER — Encounter: Payer: Self-pay | Admitting: Gastroenterology

## 2021-07-17 ENCOUNTER — Ambulatory Visit: Payer: BC Managed Care – PPO | Admitting: Anesthesiology

## 2021-07-17 ENCOUNTER — Other Ambulatory Visit: Payer: Self-pay

## 2021-07-17 ENCOUNTER — Ambulatory Visit
Admission: RE | Admit: 2021-07-17 | Discharge: 2021-07-17 | Disposition: A | Discharge status: Home / Self Care | Payer: BC Managed Care – PPO | Source: Ambulatory Visit | Attending: Gastroenterology | Admitting: Gastroenterology

## 2021-07-17 ENCOUNTER — Encounter
Admission: RE | Disposition: A | Discharge status: Home / Self Care | Payer: Self-pay | Source: Ambulatory Visit | Attending: Gastroenterology

## 2021-07-17 ENCOUNTER — Encounter: Payer: Self-pay | Admitting: *Deleted

## 2021-07-17 DIAGNOSIS — K209 Esophagitis, unspecified without bleeding: Secondary | ICD-10-CM

## 2021-07-17 DIAGNOSIS — K2210 Ulcer of esophagus without bleeding: Secondary | ICD-10-CM | POA: Insufficient documentation

## 2021-07-17 DIAGNOSIS — Z86711 Personal history of pulmonary embolism: Secondary | ICD-10-CM | POA: Insufficient documentation

## 2021-07-17 DIAGNOSIS — G4733 Obstructive sleep apnea (adult) (pediatric): Secondary | ICD-10-CM | POA: Insufficient documentation

## 2021-07-17 DIAGNOSIS — R948 Abnormal results of function studies of other organs and systems: Secondary | ICD-10-CM

## 2021-07-17 DIAGNOSIS — R933 Abnormal findings on diagnostic imaging of other parts of digestive tract: Secondary | ICD-10-CM | POA: Diagnosis present

## 2021-07-17 DIAGNOSIS — Z8572 Personal history of non-Hodgkin lymphomas: Secondary | ICD-10-CM | POA: Diagnosis not present

## 2021-07-17 DIAGNOSIS — K219 Gastro-esophageal reflux disease without esophagitis: Secondary | ICD-10-CM

## 2021-07-17 HISTORY — PX: ESOPHAGOGASTRODUODENOSCOPY (EGD) WITH PROPOFOL: SHX5813

## 2021-07-17 HISTORY — DX: Scoliosis, unspecified: M41.9

## 2021-07-17 HISTORY — DX: Personal history of other specified (corrected) congenital malformations of nervous system and sense organs: Z87.728

## 2021-07-17 SURGERY — ESOPHAGOGASTRODUODENOSCOPY (EGD) WITH PROPOFOL
Anesthesia: General | Site: Mouth

## 2021-07-17 MED ORDER — GLYCOPYRROLATE 0.2 MG/ML IJ SOLN
INTRAMUSCULAR | Status: DC | PRN
  Administered 2021-07-17: .1 mg via INTRAVENOUS

## 2021-07-17 MED ORDER — LIDOCAINE HCL (CARDIAC) PF 100 MG/5ML IV SOSY
PREFILLED_SYRINGE | INTRAVENOUS | Status: DC | PRN
  Administered 2021-07-17: 30 mg via INTRAVENOUS

## 2021-07-17 MED ORDER — SODIUM CHLORIDE 0.9 % IV SOLN: INTRAVENOUS | Status: DC

## 2021-07-17 MED ORDER — PROPOFOL 10 MG/ML IV BOLUS
INTRAVENOUS | Status: DC | PRN
  Administered 2021-07-17: 50 mg via INTRAVENOUS
  Administered 2021-07-17: 150 mg via INTRAVENOUS

## 2021-07-17 MED ORDER — LACTATED RINGERS IV SOLN: INTRAVENOUS | Status: DC

## 2021-07-17 MED ORDER — STERILE WATER FOR IRRIGATION IR SOLN
Status: DC | PRN
  Administered 2021-07-17: 1

## 2021-07-17 MED ORDER — ACETAMINOPHEN 160 MG/5ML PO SOLN: 325.0000 mg | ORAL | Status: DC | PRN

## 2021-07-17 MED ORDER — ACETAMINOPHEN 325 MG PO TABS: 325.0000 mg | ORAL_TABLET | ORAL | Status: DC | PRN

## 2021-07-17 SURGICAL SUPPLY — 8 items
BLOCK BITE 60FR ADLT L/F GRN (MISCELLANEOUS) ×3 IMPLANT
FORCEPS BIOP RAD 4 LRG CAP 4 (CUTTING FORCEPS) ×2 IMPLANT
GOWN CVR UNV OPN BCK APRN NK (MISCELLANEOUS) ×2 IMPLANT
GOWN ISOL THUMB LOOP REG UNIV (MISCELLANEOUS) ×6
KIT PRC NS LF DISP ENDO (KITS) ×1 IMPLANT
KIT PROCEDURE OLYMPUS (KITS) ×3
MANIFOLD NEPTUNE II (INSTRUMENTS) ×3 IMPLANT
WATER STERILE IRR 250ML POUR (IV SOLUTION) ×3 IMPLANT

## 2021-07-17 NOTE — Progress Notes (Signed)
Patient had endoscopy with biopsies today to follow up on PET abnormality. Will follow for path.  Oncology Nurse Navigator Documentation  Oncology Nurse Navigator Flowsheets 07/17/2021  Abnormal Finding Date -  Confirmed Diagnosis Date -  Diagnosis Status -  Planned Course of Treatment -  Phase of Treatment -  Chemotherapy Actual Start Date: -  Radiation Pending-Reason: -  Radiation Actual Start Date: -  Radiation Expected End Date: -  Radiation Actual End Date: -  Navigator Follow Up Date: 07/21/2021  Navigator Follow Up Reason: Pathology  Navigator Location [facility]  Navigator Encounter Type Appt/Treatment Plan Review  Telephone -  Treatment Initiated Date -  Patient Visit Type MedOnc  Treatment Phase Follow-up  Barriers/Navigation Needs Coordination of Care  Education -  Interventions None Required  Acuity Level 2-Minimal Needs (1-2 Barriers Identified)  Referrals -  Coordination of Care -  Education Method -  Support Groups/Services Friends and Family  Time Spent with Patient 15

## 2021-07-17 NOTE — Transfer of Care (Signed)
Immediate Anesthesia Transfer of Care Note  Patient: KAMREN HEINTZELMAN  Procedure(s) Performed: ESOPHAGOGASTRODUODENOSCOPY (EGD) WITH BIOPSY (Mouth)  Patient Location: PACU  Anesthesia Type: General  Level of Consciousness: awake, alert  and patient cooperative  Airway and Oxygen Therapy: Patient Spontanous Breathing and Patient connected to supplemental oxygen  Post-op Assessment: Post-op Vital signs reviewed, Patient's Cardiovascular Status Stable, Respiratory Function Stable, Patent Airway and No signs of Nausea or vomiting  Post-op Vital Signs: Reviewed and stable  Complications: No notable events documented.

## 2021-07-17 NOTE — Op Note (Signed)
[facility] Gastroenterology Patient Name: Travis Duran Procedure Date: 07/17/2021 10:18 AM MRN: [id number] Account #: [id number] Date of Birth: [birth year] Admit Type: Outpatient Age: 45 Room: [facility] OR ROOM 01 Gender: Male Note Status: Finalized Instrument Name: [id number] Procedure:             Upper GI endoscopy Indications:           Abnormal PET scan of the GI tract Providers:             Lucilla Lame MD, MD Medicines:             Propofol per Anesthesia Complications:         No immediate complications. Procedure:             Pre-Anesthesia Assessment:                        - Prior to the procedure, a History and Physical was                         performed, and patient medications and allergies were                         reviewed. The patient's tolerance of previous                         anesthesia was also reviewed. The risks and benefits                         of the procedure and the sedation options and risks                         were discussed with the patient. All questions were                         answered, and informed consent was obtained. Prior                         Anticoagulants: The patient has taken no previous                         anticoagulant or antiplatelet agents. ASA Grade                         Assessment: II - A patient with mild systemic disease.                         After reviewing the risks and benefits, the patient                         was deemed in satisfactory condition to undergo the                         procedure.                        After obtaining informed consent, the endoscope was                         passed under direct vision. Throughout the  procedure,                         the patient's blood pressure, pulse, and oxygen                         saturations were monitored continuously. The Endoscope                         was introduced through the mouth, and advanced to the                          second part of duodenum. The upper GI endoscopy was                         accomplished without difficulty. The patient tolerated                         the procedure. Findings:      LA Grade B (one or more mucosal breaks greater than 5 mm, not extending       between the tops of two mucosal folds) esophagitis with no bleeding was       found at the gastroesophageal junction. Biopsies were taken with a cold       forceps for histology.      The stomach was normal.      The examined duodenum was normal. Impression:            - LA Grade B esophagitis with no bleeding. Biopsied.                        - Normal stomach.                        - Normal examined duodenum. Recommendation:        - Discharge patient to home.                        - Resume previous diet.                        - Continue present medications.                        - Await pathology results. Procedure Code(s):     --- Professional ---                        [id number], Esophagogastroduodenoscopy, flexible,                         transoral; with biopsy, single or multiple Diagnosis Code(s):     --- Professional ---                        R93.3, Abnormal findings on diagnostic imaging of                         other parts of digestive tract                        K20.90, Esophagitis, unspecified without bleeding CPT copyright 2019  American Medical Association. All rights reserved. The codes documented in this report are preliminary and upon coder review may  be revised to meet current compliance requirements. Lucilla Lame MD, MD 07/17/2021 10:42:11 AM This report has been signed electronically. Number of Addenda: 0 Note Initiated On: 07/17/2021 10:18 AM Total Procedure Duration: 0 hours 7 minutes 27 seconds  Estimated Blood Loss:  Estimated blood loss: none.      [facility]

## 2021-07-17 NOTE — Anesthesia Postprocedure Evaluation (Signed)
Anesthesia Post Note  Patient: Travis Duran  Procedure(s) Performed: ESOPHAGOGASTRODUODENOSCOPY (EGD) WITH BIOPSY (Mouth)     Patient location during evaluation: PACU Anesthesia Type: General Level of consciousness: awake and alert Pain management: pain level controlled Vital Signs Assessment: post-procedure vital signs reviewed and stable Respiratory status: spontaneous breathing, nonlabored ventilation, respiratory function stable and patient connected to nasal cannula oxygen Cardiovascular status: blood pressure returned to baseline and stable Postop Assessment: no apparent nausea or vomiting Anesthetic complications: no   No notable events documented.  Trecia Rogers

## 2021-07-17 NOTE — Anesthesia Procedure Notes (Signed)
Date/Time: 07/17/2021 10:27 AM Performed by: Cameron Ali, CRNA Pre-anesthesia Checklist: Patient identified, Emergency Drugs available, Suction available, Timeout performed and Patient being monitored Patient Re-evaluated:Patient Re-evaluated prior to induction Oxygen Delivery Method: Nasal cannula Placement Confirmation: positive ETCO2

## 2021-07-17 NOTE — Interval H&P Note (Signed)
Lucilla Lame, MD [facility] [address] [place], [place] [address] Phone:[phone] Fax : [phone]  Primary Care Physician:  Montel Culver, MD Primary Gastroenterologist:  Dr. Allen Norris  Pre-Procedure History & Physical: HPI:  Travis Duran is a 45 y.o. male is here for an endoscopy.   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 (Improved with wt loss)   Pulmonary embolism ([place]) 07/02/2020   Scoliosis     Past Surgical History:  Procedure Laterality Date   BRONCHIAL WASHINGS  07/03/2020   Procedure: BRONCHIAL WASHINGS;  Surgeon: Spero Geralds, MD;  Location: [facility];  Service: Pulmonary;;   burn to left hand     skin graphs   FINE NEEDLE ASPIRATION  04/25/2020   Procedure: FINE NEEDLE ASPIRATION (FNA) LINEAR;  Surgeon: Candee Furbish, MD;  Location: [place];  Service: Pulmonary;;   IR IMAGING GUIDED PORT INSERTION  05/07/2020   IR THORACENTESIS ASP PLEURAL SPACE W/IMG GUIDE  04/25/2020   IR THORACENTESIS ASP PLEURAL SPACE W/IMG GUIDE  05/07/2020   REFRACTIVE SURGERY  09/2012   bilateral    THORACENTESIS  04/25/2020   Procedure: THORACENTESIS;  Surgeon: Candee Furbish, MD;  Location: [place];  Service: Pulmonary;;   VIDEO BRONCHOSCOPY N/A 07/03/2020   Procedure: VIDEO BRONCHOSCOPY WITHOUT FLUORO;  Surgeon: Spero Geralds, MD;  Location: [facility];  Service: Pulmonary;  Laterality: N/A;   VIDEO BRONCHOSCOPY WITH ENDOBRONCHIAL ULTRASOUND N/A 04/25/2020   Procedure: VIDEO BRONCHOSCOPY WITH ENDOBRONCHIAL ULTRASOUND;  Surgeon: Candee Furbish, MD;  Location: [facility];  Service: Pulmonary;  Laterality: N/A;    Prior to Admission medications   Medication Sig Start Date End Date Taking? Authorizing Provider  Melatonin 3 MG CAPS Take 3 mg by mouth at bedtime.    Yes [provider]  tadalafil (CIALIS) 10 MG tablet Take 1 tablet (10 mg total) by mouth daily as needed for erectile dysfunction. 04/06/19  Yes Laurey Morale, MD  rivaroxaban (XARELTO) 10 MG TABS tablet Take 1 tablet (10 mg total) by mouth daily. Patient not taking: Reported on 07/13/2021 02/11/21   Volanda Napoleon, MD  apixaban (ELIQUIS) 5 MG TABS tablet Take 1 tablet (5 mg total) by mouth 2 (two) times daily. Patient not taking: No sig reported 08/12/20 08/29/20  Volanda Napoleon, MD    Allergies as of 07/13/2021   (No Known Allergies)    Family History  Problem Relation Age of Onset   COPD Father    Emphysema Father    Hypertension Father    COPD Sister    Sleep apnea Other    Hypertension Other     Social History   Socioeconomic History   Marital status: Married    Spouse name: Vernel Langenderfer   Number of children: 0   Years of education: 18   Highest education level: Master's degree (e.g., MA, MS, MEng, MEd, MSW, MBA)  Occupational History   Occupation: Secondary school teacher IT  Tobacco Use   Smoking status: Never   Smokeless tobacco: Never  Vaping Use   Vaping Use: Never used  Substance and Sexual Activity   Alcohol use: Yes    Comment: rare   Drug use: Never   Sexual activity: Yes    Partners:  Female  Other Topics Concern   Not on file  Social History Narrative   Not on file   Social Determinants of Health   Financial Resource Strain: Not on file  Food Insecurity: Not on file  Transportation Needs: Not on file  Physical Activity: Not on file  Stress: Not on file  Social Connections: Not on file  Intimate Partner Violence: Not on file    Review of Systems: See HPI, otherwise negative ROS  Physical Exam: BP 114/77    Pulse 76    Temp (!) 97.3 F (36.3 C) (Temporal)    Resp 16    Ht 5\' 9"  (1.753 m)    Wt 92.5 kg    SpO2 98%    BMI 30.13 kg/m  General:   Alert,  pleasant and cooperative in NAD Head:  Normocephalic and atraumatic. Neck:   Supple; no masses or thyromegaly. Lungs:  Clear throughout to auscultation.    Heart:  Regular rate and rhythm. Abdomen:  Soft, nontender and nondistended. Normal bowel sounds, without guarding, and without rebound.   Neurologic:  Alert and  oriented x4;  grossly normal neurologically.  Impression/Plan: Travis Duran is here for an endoscopy to be performed for abnormal PET scan  Risks, benefits, limitations, and alternatives regarding  endoscopy have been reviewed with the patient.  Questions have been answered.  All parties agreeable.   Lucilla Lame, MD  07/17/2021, 10:16 AM

## 2021-07-17 NOTE — Anesthesia Preprocedure Evaluation (Signed)
Anesthesia Evaluation  Patient identified by MRN, date of birth, ID band Patient awake    Reviewed: Allergy & Precautions, H&P , NPO status , Patient's Chart, lab work & pertinent test results, reviewed documented beta blocker date and time   Airway Mallampati: I  TM Distance: >3 FB Neck ROM: full    Dental no notable dental hx.    Pulmonary sleep apnea ,  Hx of PE 07/2020   Pulmonary exam normal breath sounds clear to auscultation       Cardiovascular Exercise Tolerance: Good negative cardio ROS Normal cardiovascular exam Rhythm:regular Rate:Normal     Neuro/Psych negative neurological ROS  negative psych ROS   GI/Hepatic negative GI ROS, Neg liver ROS,   Endo/Other  negative endocrine ROS  Renal/GU negative Renal ROS  negative genitourinary   Musculoskeletal   Abdominal   Peds  Hematology negative hematology ROS (+)   Anesthesia Other Findings   Reproductive/Obstetrics negative OB ROS                             Anesthesia Physical Anesthesia Plan  ASA: 2  Anesthesia Plan: General   Post-op Pain Management:    Induction:   PONV Risk Score and Plan:   Airway Management Planned:   Additional Equipment:   Intra-op Plan:   Post-operative Plan:   Informed Consent: I have reviewed the patients History and Physical, chart, labs and discussed the procedure including the risks, benefits and alternatives for the proposed anesthesia with the patient or authorized representative who has indicated his/her understanding and acceptance.     Dental Advisory Given  Plan Discussed with: CRNA and Anesthesiologist  Anesthesia Plan Comments:         Anesthesia Quick Evaluation

## 2021-07-20 ENCOUNTER — Encounter: Payer: Self-pay | Admitting: Gastroenterology

## 2021-07-21 LAB — SURGICAL PATHOLOGY

## 2021-07-22 ENCOUNTER — Encounter: Payer: Self-pay | Admitting: *Deleted

## 2021-07-22 NOTE — Progress Notes (Signed)
Reviewed path with Dr Marin Olp. No concerns. Since patient is post treatment and follow up workup is negative, will discontinue active navigation.   Oncology Nurse Navigator Documentation  Oncology Nurse Navigator Flowsheets 07/22/2021  Abnormal Finding Date -  Confirmed Diagnosis Date -  Diagnosis Status -  Planned Course of Treatment -  Phase of Treatment -  Chemotherapy Actual Start Date: -  Radiation Pending-Reason: -  Radiation Actual Start Date: -  Radiation Expected End Date: -  Radiation Actual End Date: -  Navigator Follow Up Date: -  Navigator Follow Up Reason: -  Navigation Complete Date: 07/22/2021  Post Navigation: Continue to Follow Patient? No  Reason Not Navigating Patient: No Treatment, Observation Only  Navigator Restaurant manager, fast food Encounter Type Pathology Review  Telephone -  Treatment Initiated Date -  Patient Visit Type MedOnc  Treatment Phase Follow-up  Barriers/Navigation Needs No Barriers At This Time  Education -  Interventions Coordination of Care  Acuity Level 1-No Barriers  Referrals -  Coordination of Care Pathology  Education Method -  Support Groups/Services Friends and Family  Time Spent with Patient 15

## 2021-07-23 ENCOUNTER — Encounter: Payer: Self-pay | Admitting: Gastroenterology

## 2021-07-27 ENCOUNTER — Encounter: Payer: Self-pay | Admitting: Gastroenterology

## 2021-07-27 ENCOUNTER — Encounter: Payer: Self-pay | Admitting: *Deleted

## 2021-07-28 ENCOUNTER — Encounter: Payer: Self-pay | Admitting: *Deleted

## 2021-07-28 DIAGNOSIS — Z95828 Presence of other vascular implants and grafts: Secondary | ICD-10-CM

## 2021-07-28 NOTE — Progress Notes (Signed)
See MyChart communication from 07/27/2021.   Oncology Nurse Navigator Documentation  Oncology Nurse Navigator Flowsheets 07/28/2021  Abnormal Finding Date -  Confirmed Diagnosis Date -  Diagnosis Status -  Planned Course of Treatment -  Phase of Treatment -  Chemotherapy Actual Start Date: -  Radiation Pending-Reason: -  Radiation Actual Start Date: -  Radiation Expected End Date: -  Radiation Actual End Date: -  Navigator Follow Up Date: -  Navigator Follow Up Reason: -  Navigation Complete Date: -  Post Navigation: Continue to Follow Patient? -  Reason Not Navigating Patient: -  Production assistant, radio Encounter Type MyChart  Telephone -  Treatment Initiated Date -  Patient Visit Type MedOnc  Treatment Phase Follow-up  Barriers/Navigation Needs Coordination of Care  Education -  Interventions Coordination of Care  Acuity Level 1-No Barriers  Referrals -  Coordination of Care Radiology  Education Method -  Support Groups/Services Friends and Family  Time Spent with Patient 30

## 2021-08-05 MED ORDER — PANTOPRAZOLE SODIUM 40 MG PO TBEC: 40.0000 mg | DELAYED_RELEASE_TABLET | Freq: Every day | ORAL | 1 refills | Status: DC

## 2021-08-05 NOTE — Addendum Note (Signed)
Addended by: Lurlean Nanny on: 08/05/2021 10:45 AM   Modules accepted: Orders

## 2021-08-17 ENCOUNTER — Other Ambulatory Visit: Payer: Self-pay | Admitting: Radiology

## 2021-08-18 ENCOUNTER — Ambulatory Visit
Admission: RE | Admit: 2021-08-18 | Discharge: 2021-08-18 | Disposition: A | Discharge status: Home / Self Care | Payer: BC Managed Care – PPO | Source: Ambulatory Visit | Attending: Hematology & Oncology | Admitting: Hematology & Oncology

## 2021-08-18 ENCOUNTER — Encounter: Payer: Self-pay | Admitting: Radiology

## 2021-08-18 DIAGNOSIS — Z9221 Personal history of antineoplastic chemotherapy: Secondary | ICD-10-CM | POA: Diagnosis not present

## 2021-08-18 DIAGNOSIS — Z8572 Personal history of non-Hodgkin lymphomas: Secondary | ICD-10-CM | POA: Insufficient documentation

## 2021-08-18 DIAGNOSIS — Z452 Encounter for adjustment and management of vascular access device: Secondary | ICD-10-CM | POA: Diagnosis present

## 2021-08-18 DIAGNOSIS — Z95828 Presence of other vascular implants and grafts: Secondary | ICD-10-CM

## 2021-08-18 HISTORY — PX: IR REMOVAL TUN ACCESS W/ PORT W/O FL MOD SED: IMG2290

## 2021-08-18 MED ORDER — FENTANYL CITRATE (PF) 100 MCG/2ML IJ SOLN
INTRAMUSCULAR | Status: AC
  Filled 2021-08-18: qty 2

## 2021-08-18 MED ORDER — FENTANYL CITRATE (PF) 100 MCG/2ML IJ SOLN
INTRAMUSCULAR | Status: DC | PRN
  Administered 2021-08-18 (×2): 50 ug via INTRAVENOUS

## 2021-08-18 MED ORDER — SODIUM CHLORIDE 0.9 % IV SOLN
INTRAVENOUS | Status: DC
  Filled 2021-08-18: qty 1000

## 2021-08-18 MED ORDER — MIDAZOLAM HCL 2 MG/2ML IJ SOLN
INTRAMUSCULAR | Status: DC | PRN
  Administered 2021-08-18 (×2): 1 mg via INTRAVENOUS

## 2021-08-18 MED ORDER — MIDAZOLAM HCL 2 MG/2ML IJ SOLN
INTRAMUSCULAR | Status: AC
  Filled 2021-08-18: qty 2

## 2021-08-18 MED ORDER — LIDOCAINE-EPINEPHRINE 1 %-1:100000 IJ SOLN
INTRAMUSCULAR | Status: AC
  Administered 2021-08-18: 7 mL
  Filled 2021-08-18: qty 1

## 2021-08-18 NOTE — Progress Notes (Signed)
Patient clinically stable post Port removal, tolerated well. Received Versed 2 mg along with Fentanyl 100 mcg IV for procedure. Report given to [facility] RN post procedure/specials.

## 2021-08-18 NOTE — H&P (Signed)
Chief Complaint: Patient was seen in consultation today for port removal at the request of Ennever,Peter R  Referring Physician(s): Ennever,Peter R  Supervising Physician: Juliet Rude  Patient Status: [place] - Out-pt  History of Present Illness: Travis Duran is a 46 y.o. 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 Marin Olp and is no longer requiring treatment, no evidence of disease recurrence and request received for port removal. Right sided IJ port placed by [place] 05/2020.   The patient denies any current chest pain or shortness of breath. The patient denies any history of sleep apnea or chronic oxygen use. He has no known complications to sedation.    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 (Improved with wt loss)   Pulmonary embolism ([place]) 07/02/2020   Scoliosis     Past Surgical History:  Procedure Laterality Date   BRONCHIAL WASHINGS  07/03/2020   Procedure: BRONCHIAL WASHINGS;  Surgeon: Spero Geralds, MD;  Location: [facility];  Service: Pulmonary;;   burn to left hand     skin graphs   ESOPHAGOGASTRODUODENOSCOPY (EGD) WITH PROPOFOL N/A 07/17/2021   Procedure: ESOPHAGOGASTRODUODENOSCOPY (EGD) WITH BIOPSY;  Surgeon: Lucilla Lame, MD;  Location: [place];  Service: Endoscopy;  Laterality: N/A;   FINE NEEDLE ASPIRATION  04/25/2020   Procedure: FINE NEEDLE ASPIRATION (FNA) LINEAR;  Surgeon: Candee Furbish, MD;  Location: [place];  Service: Pulmonary;;   IR IMAGING GUIDED PORT INSERTION  05/07/2020   IR THORACENTESIS ASP PLEURAL SPACE W/IMG GUIDE  04/25/2020   IR THORACENTESIS ASP PLEURAL SPACE W/IMG GUIDE  05/07/2020   REFRACTIVE SURGERY  09/2012   bilateral    THORACENTESIS  04/25/2020   Procedure:  THORACENTESIS;  Surgeon: Candee Furbish, MD;  Location: [place];  Service: Pulmonary;;   VIDEO BRONCHOSCOPY N/A 07/03/2020   Procedure: VIDEO BRONCHOSCOPY WITHOUT FLUORO;  Surgeon: Spero Geralds, MD;  Location: [facility];  Service: Pulmonary;  Laterality: N/A;   VIDEO BRONCHOSCOPY WITH ENDOBRONCHIAL ULTRASOUND N/A 04/25/2020   Procedure: VIDEO BRONCHOSCOPY WITH ENDOBRONCHIAL ULTRASOUND;  Surgeon: Candee Furbish, MD;  [facility];  Service: Pulmonary;  Laterality: N/A;    Allergies: Patient has no known allergies.  Medications: Prior to Admission medications   Medication Sig Start Date End Date Taking? Authorizing Provider  Melatonin 3 MG CAPS Take 3 mg by mouth at bedtime.   Yes [provider]  pantoprazole (PROTONIX) 40 MG tablet Take 1 tablet (40 mg total) by mouth daily. 08/05/21  Yes Lucilla Lame, MD  rivaroxaban (XARELTO) 10 MG TABS tablet Take 1 tablet (10 mg total) by mouth daily. Patient not taking: Reported on 07/13/2021 02/11/21   Volanda Napoleon, MD  tadalafil (CIALIS) 10 MG tablet Take 1 tablet (10 mg total) by mouth daily as needed for erectile dysfunction. 04/06/19   Laurey Morale, MD  apixaban (ELIQUIS) 5 MG TABS tablet Take 1 tablet (5 mg total) by mouth 2 (two) times daily. Patient not taking: No sig reported 08/12/20 08/29/20  Volanda Napoleon, MD     Family History  Problem Relation Age of Onset   COPD Father    Emphysema Father    Hypertension Father    COPD Sister  Sleep apnea Other    Hypertension Other     Social History   Socioeconomic History   Marital status: Married    Spouse name: Bethel Gaglio   Number of children: 0   Years of education: 18   Highest education level: Master's degree (e.g., MA, MS, MEng, MEd, MSW, MBA)  Occupational History   Occupation: Secondary school teacher IT  Tobacco Use   Smoking status: Never   Smokeless tobacco: Never  Vaping Use   Vaping Use: Never used  Substance and Sexual Activity    Alcohol use: Yes    Comment: rare   Drug use: Never   Sexual activity: Yes    Partners: Female  Other Topics Concern   Not on file  Social History Narrative   Not on file   Social Determinants of Health   Financial Resource Strain: Not on file  Food Insecurity: Not on file  Transportation Needs: Not on file  Physical Activity: Not on file  Stress: Not on file  Social Connections: Not on file    Review of Systems: A 12 point ROS discussed and pertinent positives are indicated in the HPI above.  All other systems are negative.  Review of Systems  Vital Signs: BP 125/84    Pulse 68    Temp 97.8 F (36.6 C) (Oral)    Resp 20    Ht 5\' 9"  (1.753 m)    Wt 200 lb (90.7 kg)    SpO2 99%    BMI 29.53 kg/m   Physical Exam Constitutional:      Appearance: Normal appearance.  HENT:     Head: Normocephalic and atraumatic.  Cardiovascular:     Rate and Rhythm: Normal rate and regular rhythm.  Pulmonary:     Effort: Pulmonary effort is normal. No respiratory distress.  Skin:    General: Skin is warm and dry.  Neurological:     Mental Status: He is alert and oriented to person, place, and time.   Labs:  CBC: Recent Labs    11/05/20 1117 01/08/21 0746 03/11/21 0807 07/10/21 0856  WBC 3.9* 4.2 4.8 7.2  HGB 13.4 13.7 13.4 13.3  HCT 40.2 40.2 39.6 38.4*  PLT 281 264 266 255    COAGS: No results for input(s): INR, APTT in the last 8760 hours.  BMP: Recent Labs    11/05/20 1117 01/08/21 0746 03/11/21 0807 04/14/21 0853 07/10/21 1007  NA 141 139 140 140 138  K 4.0 3.8 3.9 4.9 3.9  CL 106 106 105 103 104  CO2 29 25 27 25 27   GLUCOSE 82 150* 83 89 92  BUN 12 19 15 17 15   CALCIUM 9.6 9.5 9.7 9.9 9.9  CREATININE 1.17 1.11 1.09 1.26 1.15  GFRNONAA >60 >60 >60  --  >60    LIVER FUNCTION TESTS: Recent Labs    01/08/21 0746 03/11/21 0807 04/14/21 0853 07/10/21 1007  BILITOT 0.5 0.6 0.3 0.4  AST 15 13* 15 13*  ALT 16 13 14 12   ALKPHOS 98 108 133* 97  PROT 5.9*  6.0* 6.6 6.8  ALBUMIN 4.2 4.2 4.6 4.2    Assessment and Plan: 46 year old male with 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 Marin Olp and is no longer requiring treatment, no evidence of disease recurrence and request received for port removal. Right sided IJ port placed by [place] 05/2020.   The patient has been NPO, no blood thinners taken, labs and  vitals have been reviewed.  Risks and benefits of port removal was discussed with the patient and/or patient's family including, but not limited to bleeding, infection, damage to adjacent structures. All of the questions were answered and there is agreement to proceed.  Consent signed and in chart.   Thank you for this interesting consult.  I greatly enjoyed meeting Travis Duran and look forward to participating in their care.  A copy of this report was sent to the requesting provider on this date.  Electronically Signed: Hedy Jacob, PA-C 08/18/2021, 1:55 PM   I spent a total of 15 Minutes in face to face in clinical consultation, greater than 50% of which was counseling/coordinating care for port removal.

## 2021-08-24 ENCOUNTER — Other Ambulatory Visit: Payer: Self-pay

## 2021-08-24 ENCOUNTER — Ambulatory Visit: Payer: BC Managed Care – PPO | Admitting: Family Medicine

## 2021-08-24 ENCOUNTER — Encounter: Payer: Self-pay | Admitting: Family Medicine

## 2021-08-24 VITALS — BP 120/82 | HR 84 | Ht 69.0 in | Wt 209.0 lb

## 2021-08-24 DIAGNOSIS — Z0000 Encounter for general adult medical examination without abnormal findings: Secondary | ICD-10-CM | POA: Diagnosis not present

## 2021-08-24 DIAGNOSIS — E559 Vitamin D deficiency, unspecified: Secondary | ICD-10-CM | POA: Diagnosis not present

## 2021-08-24 DIAGNOSIS — Z13220 Encounter for screening for lipoid disorders: Secondary | ICD-10-CM

## 2021-08-24 DIAGNOSIS — G4733 Obstructive sleep apnea (adult) (pediatric): Secondary | ICD-10-CM

## 2021-08-24 DIAGNOSIS — L918 Other hypertrophic disorders of the skin: Secondary | ICD-10-CM

## 2021-08-24 DIAGNOSIS — K219 Gastro-esophageal reflux disease without esophagitis: Secondary | ICD-10-CM

## 2021-08-24 NOTE — Assessment & Plan Note (Signed)
Was recently started on pantoprazole by GI due to PET scan showing activity in the esophagus, this was considered "silent GERD".  He is requesting dietary modifications and treatments for GERD, these were provided to the patient today.

## 2021-08-24 NOTE — Assessment & Plan Note (Signed)
Annual examination completed, risk stratification labs ordered, anticipatory guidance provided.  We will follow labs once resulted. 

## 2021-08-24 NOTE — Assessment & Plan Note (Signed)
Chronic issue, was recommended to utilize CPAP, however is not currently doing so citing uncertainty over OSA following significant weight loss.  We have placed a referral to ENT/sleep medicine in the past, due to life stressors he was able to maintain that follow-up but is amenable to checking in with them.  A referral was generated and placed in his chart.

## 2021-08-24 NOTE — Patient Instructions (Addendum)
-   Obtain fasting labs with orders provided (can have water or black coffee but otherwise no food or drink x 8 hours before labs) °- Review information provided °- Attend eye doctor annually, dentist every 6 months, work towards or maintain 30 minutes of moderate intensity physical activity at least 5 days per week, and consume a balanced diet °- Return in 1 year for physical °- Contact us for any questions between now and then °

## 2021-08-24 NOTE — Progress Notes (Signed)
Annual Physical Exam Visit  Patient Information:  Patient ID: Travis Duran, male DOB: 04-Sep-1975 Age: 46 y.o. MRN: [id number]   Subjective:   CC: Annual Physical Exam  HPI:  Travis Duran is here for their annual physical.  I reviewed the past medical history, family history, social history, surgical history, and allergies today and changes were made as necessary.  Please see the problem list section below for additional details.  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 (Improved with wt loss)   Pulmonary embolism ([place]) 07/02/2020   Scoliosis    Sleep apnea    Past Surgical History: Past Surgical History:  Procedure Laterality Date   BRONCHIAL WASHINGS  07/03/2020   Procedure: BRONCHIAL WASHINGS;  Surgeon: Spero Geralds, MD;  Location: [facility];  Service: Pulmonary;;   burn to left hand     skin graphs   ESOPHAGOGASTRODUODENOSCOPY (EGD) WITH PROPOFOL N/A 07/17/2021   Procedure: ESOPHAGOGASTRODUODENOSCOPY (EGD) WITH BIOPSY;  Surgeon: Lucilla Lame, MD;  Location: [facility];  Service: Endoscopy;  Laterality: N/A;   FINE NEEDLE ASPIRATION  04/25/2020   Procedure: FINE NEEDLE ASPIRATION (FNA) LINEAR;  Surgeon: Candee Furbish, MD;  Location: [facility];  Service: Pulmonary;;   IR IMAGING GUIDED PORT INSERTION  05/07/2020   IR REMOVAL TUN ACCESS W/ PORT W/O FL MOD SED  08/18/2021   IR THORACENTESIS ASP PLEURAL SPACE W/IMG GUIDE  04/25/2020   IR THORACENTESIS ASP PLEURAL SPACE W/IMG GUIDE  05/07/2020   REFRACTIVE SURGERY  09/2012   bilateral    THORACENTESIS  04/25/2020   Procedure: THORACENTESIS;  Surgeon: Candee Furbish, MD;  Location: [facility];  Service: Pulmonary;;   VIDEO BRONCHOSCOPY N/A  07/03/2020   Procedure: VIDEO BRONCHOSCOPY WITHOUT FLUORO;  Surgeon: Spero Geralds, MD;  Location: [facility];  Service: Pulmonary;  Laterality: N/A;   VIDEO BRONCHOSCOPY WITH ENDOBRONCHIAL ULTRASOUND N/A 04/25/2020   Procedure: VIDEO BRONCHOSCOPY WITH ENDOBRONCHIAL ULTRASOUND;  Surgeon: Candee Furbish, MD;  Location: [facility];  Service: Pulmonary;  Laterality: N/A;   Family History: Family History  Problem Relation Age of Onset   COPD Father    Emphysema Father    Hypertension Father    COPD Sister    Sleep apnea Other    Hypertension Other    Allergies: No Known Allergies Health Maintenance: Health Maintenance  Topic Date Due   COVID-19 Vaccine (1) Never done   COLONOSCOPY (Pts 45-92yrs Insurance coverage will need to be confirmed)  Never done   INFLUENZA VACCINE  05/26/2024 (Originally 03/02/2021)   TETANUS/TDAP  12/15/2025   Hepatitis C Screening  Completed   HIV Screening  Completed   HPV VACCINES  Aged Out    HM Colonoscopy           Overdue - COLONOSCOPY (Pts 45-84yrs Insurance coverage will need to be confirmed) (Every 10 Years) Overdue - never done    No completion history exists for this topic.           Medications: Current Outpatient Medications on File Prior to Visit  Medication Sig Dispense Refill   Melatonin 3 MG CAPS Take 3 mg by mouth at bedtime.     pantoprazole (  PROTONIX) 40 MG tablet Take 1 tablet (40 mg total) by mouth daily. 30 tablet 1   tadalafil (CIALIS) 10 MG tablet Take 1 tablet (10 mg total) by mouth daily as needed for erectile dysfunction. 10 tablet 11   [DISCONTINUED] apixaban (ELIQUIS) 5 MG TABS tablet Take 1 tablet (5 mg total) by mouth 2 (two) times daily. (Patient not taking: No sig reported) 60 tablet 1   No current facility-administered medications on file prior to visit.    Review of Systems: No headache, visual changes, nausea, vomiting, diarrhea, constipation, dizziness, abdominal pain, skin rash, fevers, chills, night  sweats, swollen lymph nodes, weight loss, chest pain, body aches, joint swelling, muscle aches, shortness of breath, mood changes, visual or auditory hallucinations reported.  Objective:   Vitals:   08/24/21 1625 08/24/21 1654  BP: (!) 120/100 120/82  Pulse: 84   SpO2: 99%    Vitals:   08/24/21 1625  Weight: 209 lb (94.8 kg)  Height: 5\' 9"  (1.753 m)   Body mass index is 30.86 kg/m.  General: Well Developed, well nourished, and in no acute distress.  Neuro: Alert and oriented x3, extra-ocular muscles intact, sensation grossly intact. Cranial nerves II through XII are grossly intact, motor, sensory, and coordinative functions are intact. HEENT: Normocephalic, atraumatic, pupils equal round reactive to light, neck supple, no masses, no lymphadenopathy, thyroid nonpalpable. Oropharynx, nasopharynx, external ear canals are unremarkable. Skin: Warm and dry, no rashes noted.  Evidence of recent Port-A-Cath removal in the right upper chest, healing skin.  Back with 3 acrochordon lesions along the midline, multiple scattered overall benign appearing nevi Cardiac: Regular rate and rhythm, no murmurs rubs or gallops. No peripheral edema. Pulses symmetric. Respiratory: Clear to auscultation bilaterally. Not using accessory muscles, speaking in full sentences.  Abdominal: Soft, nontender, nondistended, positive bowel sounds, no masses, no organomegaly. Musculoskeletal: Shoulder, elbow, wrist, hip, knee, ankle stable, and with full range of motion.  Impression and Recommendations:   The patient was counselled, risk factors were discussed, and anticipatory guidance given.  OBSTRUCTIVE SLEEP APNEA Chronic issue, was recommended to utilize CPAP, however is not currently doing so citing uncertainty over OSA following significant weight loss.  We have placed a referral to ENT/sleep medicine in the past, due to life stressors he was able to maintain that follow-up but is amenable to checking in with  them.  A referral was generated and placed in his chart.  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  I did advise surveillance of these sites in addition to the multiple nevi seen on his back through dermatology, he has deferred this at this time and will contact us if considering this in the future.  Annual physical exam Annual examination completed, risk stratification labs ordered, anticipatory guidance provided.  We will follow labs once resulted.  Gastroesophageal reflux disease Was recently started on pantoprazole by GI due to PET scan showing activity in the esophagus, this was considered "silent GERD".  He is requesting dietary modifications and treatments for GERD, these were provided to the patient today.  Orders & Medications Medications: No orders of the defined types were placed in this encounter.  Orders Placed This Encounter  Procedures   Lipid panel   Apo A1 + B + Ratio   Comprehensive metabolic panel   TSH   VITAMIN D 25 Hydroxy (Vit-D Deficiency, Fractures)   CBC   Ambulatory referral to ENT     Return in about 1 year (around 08/24/2022).  Montel Culver, MD   Primary Care [facility]

## 2021-08-24 NOTE — Assessment & Plan Note (Signed)
3 isolated lesions along the midline back most consistent with acrochordon.  I did advise surveillance of these sites in addition to the multiple nevi seen on his back through dermatology, he has deferred this at this time and will contact us if considering this in the future.

## 2021-08-27 ENCOUNTER — Encounter (HOSPITAL_COMMUNITY): Payer: Self-pay | Admitting: Hematology & Oncology

## 2021-08-27 ENCOUNTER — Encounter: Payer: Self-pay | Admitting: Hematology & Oncology

## 2021-08-27 ENCOUNTER — Other Ambulatory Visit: Payer: Self-pay | Admitting: Gastroenterology

## 2021-08-27 LAB — LIPID PANEL
Chol/HDL Ratio: 4.3 ratio (ref 0.0–5.0)
Cholesterol, Total: 203 mg/dL — ABNORMAL HIGH (ref 100–199)
HDL: 47 mg/dL (ref >39)
LDL Chol Calc (NIH): 143 mg/dL — ABNORMAL HIGH (ref 0–99)
Triglycerides: 70 mg/dL (ref 0–149)
VLDL Cholesterol Cal: 13 mg/dL (ref 5–40)

## 2021-08-27 LAB — APO A1 + B + RATIO
Apolipo. B/A-1 Ratio: 0.8 ratio — ABNORMAL HIGH (ref 0.0–0.7)
Apolipoprotein A-1: 126 mg/dL (ref 101–178)
Apolipoprotein B: 103 mg/dL — ABNORMAL HIGH (ref <90)

## 2021-08-27 LAB — CBC
Hematocrit: 40.9 % (ref 37.5–51.0)
Hemoglobin: 13.9 g/dL (ref 13.0–17.7)
MCH: 30.5 pg (ref 26.6–33.0)
MCHC: 34 g/dL (ref 31.5–35.7)
MCV: 90 fL (ref 79–97)
Platelets: 274 10*3/uL (ref 150–450)
RBC: 4.55 x10E6/uL (ref 4.14–5.80)
RDW: 14.2 % (ref 11.6–15.4)
WBC: 5.2 10*3/uL (ref 3.4–10.8)

## 2021-08-27 LAB — COMPREHENSIVE METABOLIC PANEL
ALT: 11 IU/L (ref 0–44)
AST: 10 IU/L (ref 0–40)
Albumin/Globulin Ratio: 1.9 (ref 1.2–2.2)
Albumin: 4.6 g/dL (ref 4.0–5.0)
Alkaline Phosphatase: 97 IU/L (ref 44–121)
BUN/Creatinine Ratio: 13 (ref 9–20)
BUN: 16 mg/dL (ref 6–24)
Bilirubin Total: 0.3 mg/dL (ref 0.0–1.2)
CO2: 24 mmol/L (ref 20–29)
Calcium: 9.6 mg/dL (ref 8.7–10.2)
Chloride: 103 mmol/L (ref 96–106)
Creatinine, Ser: 1.22 mg/dL (ref 0.76–1.27)
Globulin, Total: 2.4 g/dL (ref 1.5–4.5)
Glucose: 81 mg/dL (ref 70–99)
Potassium: 4.8 mmol/L (ref 3.5–5.2)
Sodium: 141 mmol/L (ref 134–144)
Total Protein: 7 g/dL (ref 6.0–8.5)
eGFR: 75 mL/min/{1.73_m2} (ref >59)

## 2021-08-27 LAB — VITAMIN D 25 HYDROXY (VIT D DEFICIENCY, FRACTURES): Vit D, 25-Hydroxy: 34.5 ng/mL (ref 30.0–100.0)

## 2021-08-27 LAB — TSH: TSH: 4.84 u[IU]/mL — ABNORMAL HIGH (ref 0.450–4.500)

## 2021-08-28 LAB — T4, FREE: Free T4: 1.31 ng/dL (ref 0.82–1.77)

## 2021-08-28 LAB — T3, FREE: T3, Free: 3.6 pg/mL (ref 2.0–4.4)

## 2021-08-28 LAB — SPECIMEN STATUS REPORT

## 2021-09-02 ENCOUNTER — Encounter: Payer: Self-pay | Admitting: *Deleted

## 2021-09-02 NOTE — Progress Notes (Signed)
PET scan scheduled. MyChart message sent to patient with information regarding appointment.   Oncology Nurse Navigator Documentation  Oncology Nurse Navigator Flowsheets 09/02/2021  Abnormal Finding Date -  Confirmed Diagnosis Date -  Diagnosis Status -  Planned Course of Treatment -  Phase of Treatment -  Chemotherapy Actual Start Date: -  Radiation Pending-Reason: -  Radiation Actual Start Date: -  Radiation Expected End Date: -  Radiation Actual End Date: -  Navigator Follow Up Date: -  Navigator Follow Up Reason: -  Navigation Complete Date: -  Post Navigation: Continue to Follow Patient? 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

## 2021-09-10 ENCOUNTER — Encounter: Payer: Self-pay | Admitting: *Deleted

## 2021-09-30 ENCOUNTER — Encounter: Payer: Self-pay | Admitting: Gastroenterology

## 2021-10-01 MED ORDER — PANTOPRAZOLE SODIUM 20 MG PO TBEC: 20.0000 mg | DELAYED_RELEASE_TABLET | Freq: Every day | ORAL | 0 refills | Status: DC

## 2021-10-01 NOTE — Addendum Note (Signed)
Addended by: Lurlean Nanny on: 10/01/2021 10:50 AM ? ? Modules accepted: Orders ? ?

## 2021-10-05 ENCOUNTER — Encounter: Payer: Self-pay | Admitting: *Deleted

## 2021-10-07 ENCOUNTER — Other Ambulatory Visit: Payer: Self-pay

## 2021-10-07 ENCOUNTER — Ambulatory Visit (HOSPITAL_COMMUNITY): Payer: BC Managed Care – PPO

## 2021-10-07 ENCOUNTER — Encounter (HOSPITAL_COMMUNITY)
Admission: RE | Admit: 2021-10-07 | Discharge: 2021-10-07 | Disposition: A | Discharge status: Home / Self Care | Payer: BC Managed Care – PPO | Source: Ambulatory Visit | Attending: Hematology & Oncology | Admitting: Hematology & Oncology

## 2021-10-07 ENCOUNTER — Telehealth: Payer: Self-pay

## 2021-10-07 DIAGNOSIS — C8582 Other specified types of non-Hodgkin lymphoma, intrathoracic lymph nodes: Secondary | ICD-10-CM | POA: Diagnosis not present

## 2021-10-07 LAB — GLUCOSE, CAPILLARY: Glucose-Capillary: 85 mg/dL (ref 70–99)

## 2021-10-07 MED ORDER — FLUDEOXYGLUCOSE F - 18 (FDG) INJECTION
10.1000 | Freq: Once | INTRAVENOUS | Status: AC
  Administered 2021-10-07: 10.1 via INTRAVENOUS

## 2021-10-07 NOTE — Telephone Encounter (Signed)
Called and informed patient of lab results, patient verbalized understanding and denies any questions or concerns at this time.   

## 2021-10-07 NOTE — Telephone Encounter (Signed)
-----   Message from Volanda Napoleon, MD sent at 10/07/2021  3:22 PM EST ----- ?Please call let him know that everything looks very stable on the PET scan.  There is nothing that is new.  There is no increased activity in the center of his chest where he had the original lymphoma.  This is all great news. ? ?Travis Duran ?

## 2021-10-30 ENCOUNTER — Ambulatory Visit
Admission: RE | Admit: 2021-10-30 | Discharge: 2021-10-30 | Disposition: A | Discharge status: Home / Self Care | Payer: BC Managed Care – PPO | Source: Ambulatory Visit | Attending: Radiation Oncology | Admitting: Radiation Oncology

## 2021-10-30 ENCOUNTER — Encounter: Payer: Self-pay | Admitting: Radiation Oncology

## 2021-10-30 VITALS — BP 121/92 | HR 69 | Temp 94.5°F | Resp 18 | Ht 69.0 in | Wt 212.0 lb

## 2021-10-30 DIAGNOSIS — K209 Esophagitis, unspecified without bleeding: Secondary | ICD-10-CM | POA: Diagnosis not present

## 2021-10-30 DIAGNOSIS — C8332 Diffuse large B-cell lymphoma, intrathoracic lymph nodes: Secondary | ICD-10-CM | POA: Insufficient documentation

## 2021-10-30 DIAGNOSIS — Z923 Personal history of irradiation: Secondary | ICD-10-CM | POA: Diagnosis not present

## 2021-10-30 NOTE — Progress Notes (Signed)
Radiation Oncology ?Follow up Note ? ?Name: Travis Duran   ?Date:   10/30/2021 ?MRN:  [id number] ?DOB: 29-Apr-1976  ? ? ?This 46 y.o. 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 ? ?REFERRING PROVIDER: Montel Culver, MD ? ?HPI: Patient is a 46 year old male now at 1 year having completed involved field radiation therapy to his mediastinum after a large mediastinal presentation of large B-cell lymphoma status post 6 cycles of R EPOCH.  Seen today in routine follow-up he is doing well specifically denies fever chills night sweats or any peripheral adenopathy.  His weight is stable.  He had a recent PET scan.  Which I have reviewed showing a stable band of tissue density along the left anterior mediastinum essentially stable devolve 3 activity.  He had some activity in his esophagus underwent upper endoscopy showing esophagitis which was confirmed on biopsy.  That is being treated with proton pump inhibitors. ? ?COMPLICATIONS OF TREATMENT: none ? ?FOLLOW UP COMPLIANCE: keeps appointments  ? ?PHYSICAL EXAM:  ?BP (!) 121/92   Pulse 69   Temp (!) 94.5 ?F (34.7 ?C)   Resp 18   Ht '5\' 9"'$  (1.753 m)   Wt 212 lb (96.2 kg)   BMI 31.31 kg/m?  ?No peripheral adenopathy is identified.  Well-developed well-nourished patient in NAD. HEENT reveals PERLA, EOMI, discs not visualized.  Oral cavity is clear. No oral mucosal lesions are identified. Neck is clear without evidence of cervical or supraclavicular adenopathy. Lungs are clear to A&P. Cardiac examination is essentially unremarkable with regular rate and rhythm without murmur rub or thrill. Abdomen is benign with no organomegaly or masses noted. Motor sensory and DTR levels are equal and symmetric in the upper and lower extremities. Cranial nerves II through XII are grossly intact. Proprioception  is intact. No peripheral adenopathy or edema is identified. No motor or sensory levels are noted. Crude visual fields are within normal range. ? ?RADIOLOGY RESULTS: PET CT scan reviewed compatible with above-stated findings ? ?PLAN: Present time patient continues to do well PET CT scan is stable.  He continues close follow-up care with medical oncology.  I have asked to see him back in 1 year for follow-up.  Patient is to call with any concerns. ? ?I would like to take this opportunity to thank you for allowing me to participate in the care of your patient.. ?  ? Noreene Filbert, MD ? ?

## 2021-11-03 ENCOUNTER — Other Ambulatory Visit: Payer: BC Managed Care – PPO

## 2021-11-06 ENCOUNTER — Ambulatory Visit: Payer: BC Managed Care – PPO | Admitting: Radiation Oncology

## 2021-11-09 ENCOUNTER — Inpatient Hospital Stay: Payer: BC Managed Care – PPO

## 2021-11-09 ENCOUNTER — Encounter: Payer: Self-pay | Admitting: Hematology & Oncology

## 2021-11-09 ENCOUNTER — Other Ambulatory Visit: Payer: BC Managed Care – PPO

## 2021-11-09 ENCOUNTER — Inpatient Hospital Stay: Payer: BC Managed Care – PPO | Attending: Hematology & Oncology

## 2021-11-09 ENCOUNTER — Ambulatory Visit: Payer: BC Managed Care – PPO | Admitting: Hematology & Oncology

## 2021-11-09 ENCOUNTER — Inpatient Hospital Stay: Payer: BC Managed Care – PPO | Admitting: Hematology & Oncology

## 2021-11-09 ENCOUNTER — Other Ambulatory Visit: Payer: Self-pay

## 2021-11-09 VITALS — BP 116/83 | HR 68 | Temp 97.8°F | Resp 18 | Wt 210.0 lb

## 2021-11-09 DIAGNOSIS — C8582 Other specified types of non-Hodgkin lymphoma, intrathoracic lymph nodes: Secondary | ICD-10-CM

## 2021-11-09 DIAGNOSIS — Z8572 Personal history of non-Hodgkin lymphomas: Secondary | ICD-10-CM | POA: Insufficient documentation

## 2021-11-09 LAB — LACTATE DEHYDROGENASE: LDH: 131 U/L (ref 98–192)

## 2021-11-09 LAB — CMP (CANCER CENTER ONLY)
ALT: 16 U/L (ref 0–44)
AST: 15 U/L (ref 15–41)
Albumin: 4.6 g/dL (ref 3.5–5.0)
Alkaline Phosphatase: 82 U/L (ref 38–126)
Anion gap: 7 (ref 5–15)
BUN: 17 mg/dL (ref 6–20)
CO2: 28 mmol/L (ref 22–32)
Calcium: 10.5 mg/dL — ABNORMAL HIGH (ref 8.9–10.3)
Chloride: 104 mmol/L (ref 98–111)
Creatinine: 1.18 mg/dL (ref 0.61–1.24)
GFR, Estimated: >60 mL/min (ref >60)
Glucose, Bld: 90 mg/dL (ref 70–99)
Potassium: 3.9 mmol/L (ref 3.5–5.1)
Sodium: 139 mmol/L (ref 135–145)
Total Bilirubin: 0.4 mg/dL (ref 0.3–1.2)
Total Protein: 7.1 g/dL (ref 6.5–8.1)

## 2021-11-09 LAB — CBC WITH DIFFERENTIAL (CANCER CENTER ONLY)
Abs Immature Granulocytes: 0.02 10*3/uL (ref 0.00–0.07)
Basophils Absolute: 0 10*3/uL (ref 0.0–0.1)
Basophils Relative: 0 %
Eosinophils Absolute: 0.1 10*3/uL (ref 0.0–0.5)
Eosinophils Relative: 2 %
HCT: 42.7 % (ref 39.0–52.0)
Hemoglobin: 14.4 g/dL (ref 13.0–17.0)
Immature Granulocytes: 0 %
Lymphocytes Relative: 21 %
Lymphs Abs: 1.1 10*3/uL (ref 0.7–4.0)
MCH: 30.4 pg (ref 26.0–34.0)
MCHC: 33.7 g/dL (ref 30.0–36.0)
MCV: 90.3 fL (ref 80.0–100.0)
Monocytes Absolute: 0.5 10*3/uL (ref 0.1–1.0)
Monocytes Relative: 9 %
Neutro Abs: 3.6 10*3/uL (ref 1.7–7.7)
Neutrophils Relative %: 68 %
Platelet Count: 264 10*3/uL (ref 150–400)
RBC: 4.73 MIL/uL (ref 4.22–5.81)
RDW: 13.2 % (ref 11.5–15.5)
WBC Count: 5.3 10*3/uL (ref 4.0–10.5)
nRBC: 0 % (ref 0.0–0.2)

## 2021-11-09 NOTE — Progress Notes (Signed)
?Hematology and Oncology Follow Up Visit ? ?ARNETT DUDDY ?[id number] ?12-22-1975 46 y.o. ?11/09/2021 ? ? 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 ?Current Therapy:   ?S/p cycle #6 of R-EPOCH -- completed in 08/2020 ?Xarelto 10 mg p.o. daily --complete therapy on 07/2021  ?Consolidative radiation therapy to the residual mediastinal mass-to finish radiation therapy on 11/06/2020 ?    ?Interim History:  Mr. Hechler is back for follow-up.  As always, he is doing quite well.  His main problem has been a little bit of odynophagia.  He is working with Gastroenterology for this. ? ?We did do another PET scan on him.  This was done on March 8.  Again, there is nothing that looks like there is active disease.  He still has activity in the left anterior mediastinum with a SUV of [place] 3.  There is some activity in the tonsil and pharynx.  This appears to be physiologic. ? ?He has had problems with Protonix.  Again he is working with Gastroenterology for this. ? ?He has had no nausea or vomiting.  He is still running.  He is still fixing cars. ? ?He enjoys his new job with [employer]. ? ?He has had no fever.  He has had no rashes.  He has had no leg swelling.  There has been no cough or shortness of breath. ? ?Overall, I would say his performance status is ECOG 0.   ? ?Medications:  ?Current Outpatient Medications:  ?  Melatonin 3 MG CAPS, Take 3 mg by mouth at bedtime., Disp: , Rfl:  ?  tadalafil (CIALIS) 10 MG tablet, Take 1 tablet (10 mg total) by mouth daily as needed for erectile dysfunction., Disp: 10 tablet, Rfl: 11 ? ?Allergies: No Known Allergies ? ?Past Medical History, Surgical history, Social history, and Family History were reviewed and updated. ? ?Review of Systems: ?Review of Systems  ?Constitutional:  Negative for unexpected weight change.  ?HENT:   Negative for mouth sores.   ?Eyes: Negative.   ?Respiratory:  Positive for chest tightness, cough  and shortness of breath.   ?Cardiovascular: Negative.   ?Gastrointestinal: Negative.   ?Endocrine: Negative.   ?Genitourinary: Negative.    ?Musculoskeletal: Negative.   ?Skin: Negative.   ?Neurological: Negative.   ?Hematological: Negative.   ?Psychiatric/Behavioral: Negative.    ? ?Physical Exam: ? weight is 210 lb (95.3 kg). His oral temperature is 97.8 ?F (36.6 ?C). His blood pressure is 116/83 and his pulse is 68. His respiration is 18 and oxygen saturation is 100%.  ? ?Wt Readings from Last 3 Encounters:  ?11/09/21 210 lb (95.3 kg)  ?10/30/21 212 lb (96.2 kg)  ?08/24/21 209 lb (94.8 kg)  ? ? ?Physical Exam ?Vitals reviewed.  ?HENT:  ?   Head: Normocephalic and atraumatic.  ?Eyes:  ?   Pupils: Pupils are equal, round, and reactive to light.  ?Cardiovascular:  ?   Rate and Rhythm: Regular rhythm. Tachycardia present.  ?   Heart sounds: Normal heart sounds.  ?   Comments: Cardiac exam is regular rate and rhythm.  There are no murmurs, rubs or bruits.   ?Pulmonary:  ?   Comments: Lungs are clear bilaterally.  He has good air movement bilaterally.  There are no rales, wheezes or rhonchi.    ?Abdominal:  ?   General: Bowel sounds are normal.  ?   Palpations: Abdomen is soft.  ?Musculoskeletal:     ?   General: No  tenderness or deformity. Normal range of motion.  ?   Cervical back: Normal range of motion.  ?Lymphadenopathy:  ?   Cervical: No cervical adenopathy.  ?Skin: ?   General: Skin is warm and dry.  ?   Findings: No erythema or rash.  ?Neurological:  ?   Mental Status: He is alert and oriented to person, place, and time.  ?Psychiatric:     ?   Behavior: Behavior normal.     ?   Thought Content: Thought content normal.     ?   Judgment: Judgment normal.  ? ?Lab Results  ?Component Value Date  ? WBC 5.3 11/09/2021  ? HGB 14.4 11/09/2021  ? HCT 42.7 11/09/2021  ? MCV 90.3 11/09/2021  ? PLT 264 11/09/2021  ? ?  Chemistry   ?   ?Component Value Date/Time  ? NA 139 11/09/2021 0854  ? NA 141 08/26/2021 0819  ? K 3.9  11/09/2021 0854  ? CL 104 11/09/2021 0854  ? CO2 28 11/09/2021 0854  ? BUN 17 11/09/2021 0854  ? BUN 16 08/26/2021 0819  ? CREATININE 1.18 11/09/2021 0854  ?    ?Component Value Date/Time  ? CALCIUM 10.5 (H) 11/09/2021 0854  ? ALKPHOS 82 11/09/2021 0854  ? AST 15 11/09/2021 0854  ? ALT 16 11/09/2021 0854  ? BILITOT 0.4 11/09/2021 0854  ?  ? ? ?Impression and Plan: ?Mr. Troia is a really nice 46 year old white male.  He presented with a massive mediastinal tumor.  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  This is a B-cell lymphoma. ? ?We treated him with 6 cycles of chemotherapy with infusional R-EPOCH.  He did well with this.  Had a good response.  We then went ahead and gave him some consolidative radiation therapy because he had such a large mediastinal mass. ? ?I still think we have to follow him along with PET scans.  I think we can do a PET scan now in 4 or 5 months. ? ?I am just happy that everything seems to be going well with him. ? ?We will plan to get him back after the PET scan is done.  We will try to get him back hopefully after Labor Day. ? ? ? ?Volanda Napoleon, MD ?4/10/20239:58 AM ?

## 2021-11-10 MED ORDER — OMEPRAZOLE 20 MG PO CPDR: 20.0000 mg | DELAYED_RELEASE_CAPSULE | Freq: Every day | ORAL | 2 refills | Status: DC

## 2021-11-10 NOTE — Addendum Note (Signed)
Addended by: Lurlean Nanny on: 11/10/2021 04:37 PM ? ? Modules accepted: Orders ? ?

## 2021-12-01 ENCOUNTER — Other Ambulatory Visit: Payer: Self-pay | Admitting: Pharmacist

## 2022-03-22 ENCOUNTER — Encounter: Payer: Self-pay | Admitting: *Deleted

## 2022-04-07 ENCOUNTER — Encounter
Admission: RE | Admit: 2022-04-07 | Discharge: 2022-04-07 | Disposition: A | Discharge status: Home / Self Care | Payer: BC Managed Care – PPO | Source: Ambulatory Visit | Attending: Hematology & Oncology | Admitting: Hematology & Oncology

## 2022-04-07 DIAGNOSIS — C8582 Other specified types of non-Hodgkin lymphoma, intrathoracic lymph nodes: Secondary | ICD-10-CM | POA: Insufficient documentation

## 2022-04-07 LAB — GLUCOSE, CAPILLARY: Glucose-Capillary: 109 mg/dL — ABNORMAL HIGH (ref 70–99)

## 2022-04-07 MED ORDER — FLUDEOXYGLUCOSE F - 18 (FDG) INJECTION
10.9000 | Freq: Once | INTRAVENOUS | Status: AC | PRN
  Administered 2022-04-07: 11.14 via INTRAVENOUS

## 2022-04-09 IMAGING — CR DG CHEST 2V
1 series · 2 of 2 positions shown · non-contrast
Comparison: None.

CLINICAL DATA: Chronic cough

EXAM:
CHEST - 2 VIEW

[Series 1: dg chest 2 view · 0.14mm/px · 2 of 2 slices shown]
[im 1/2]
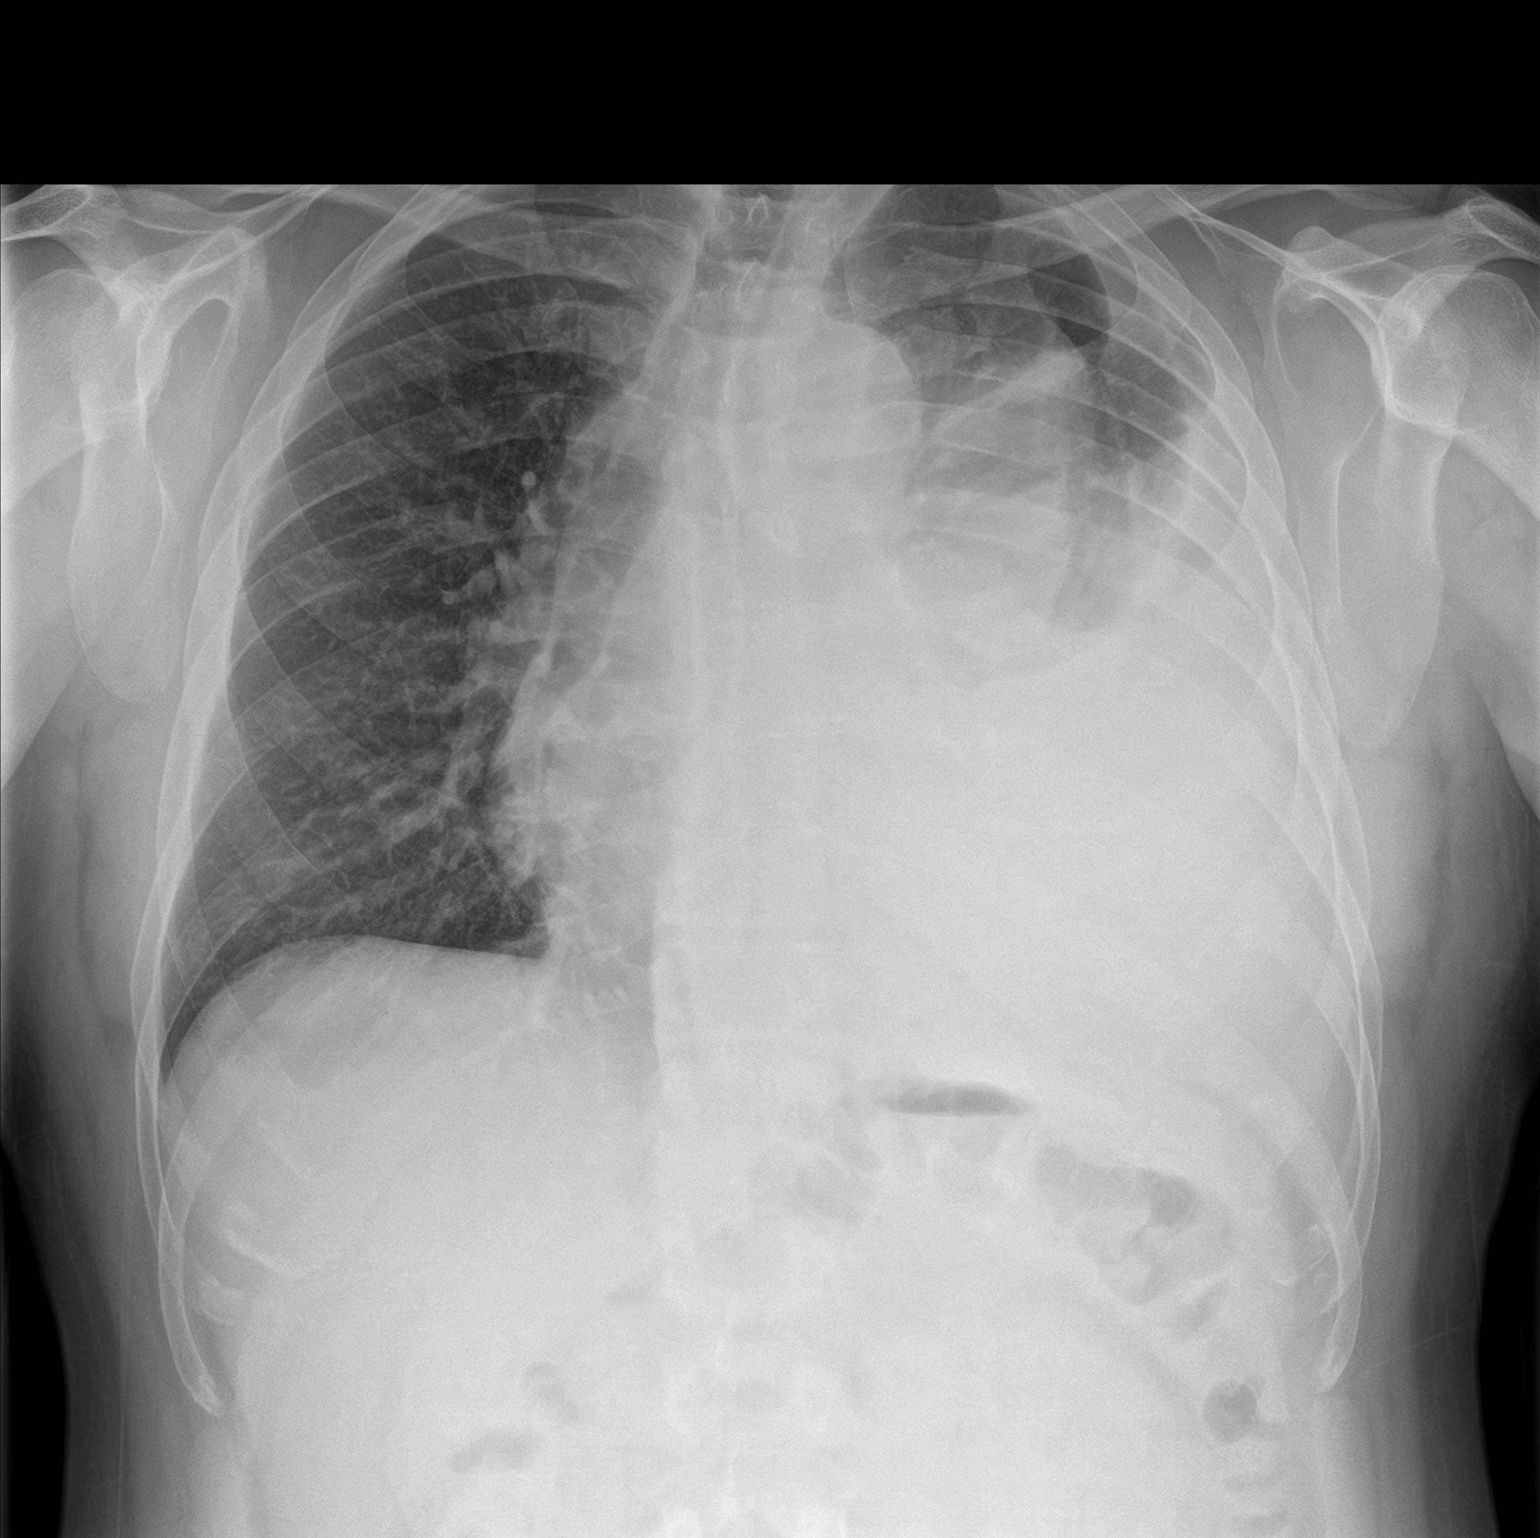
[im 2/2]
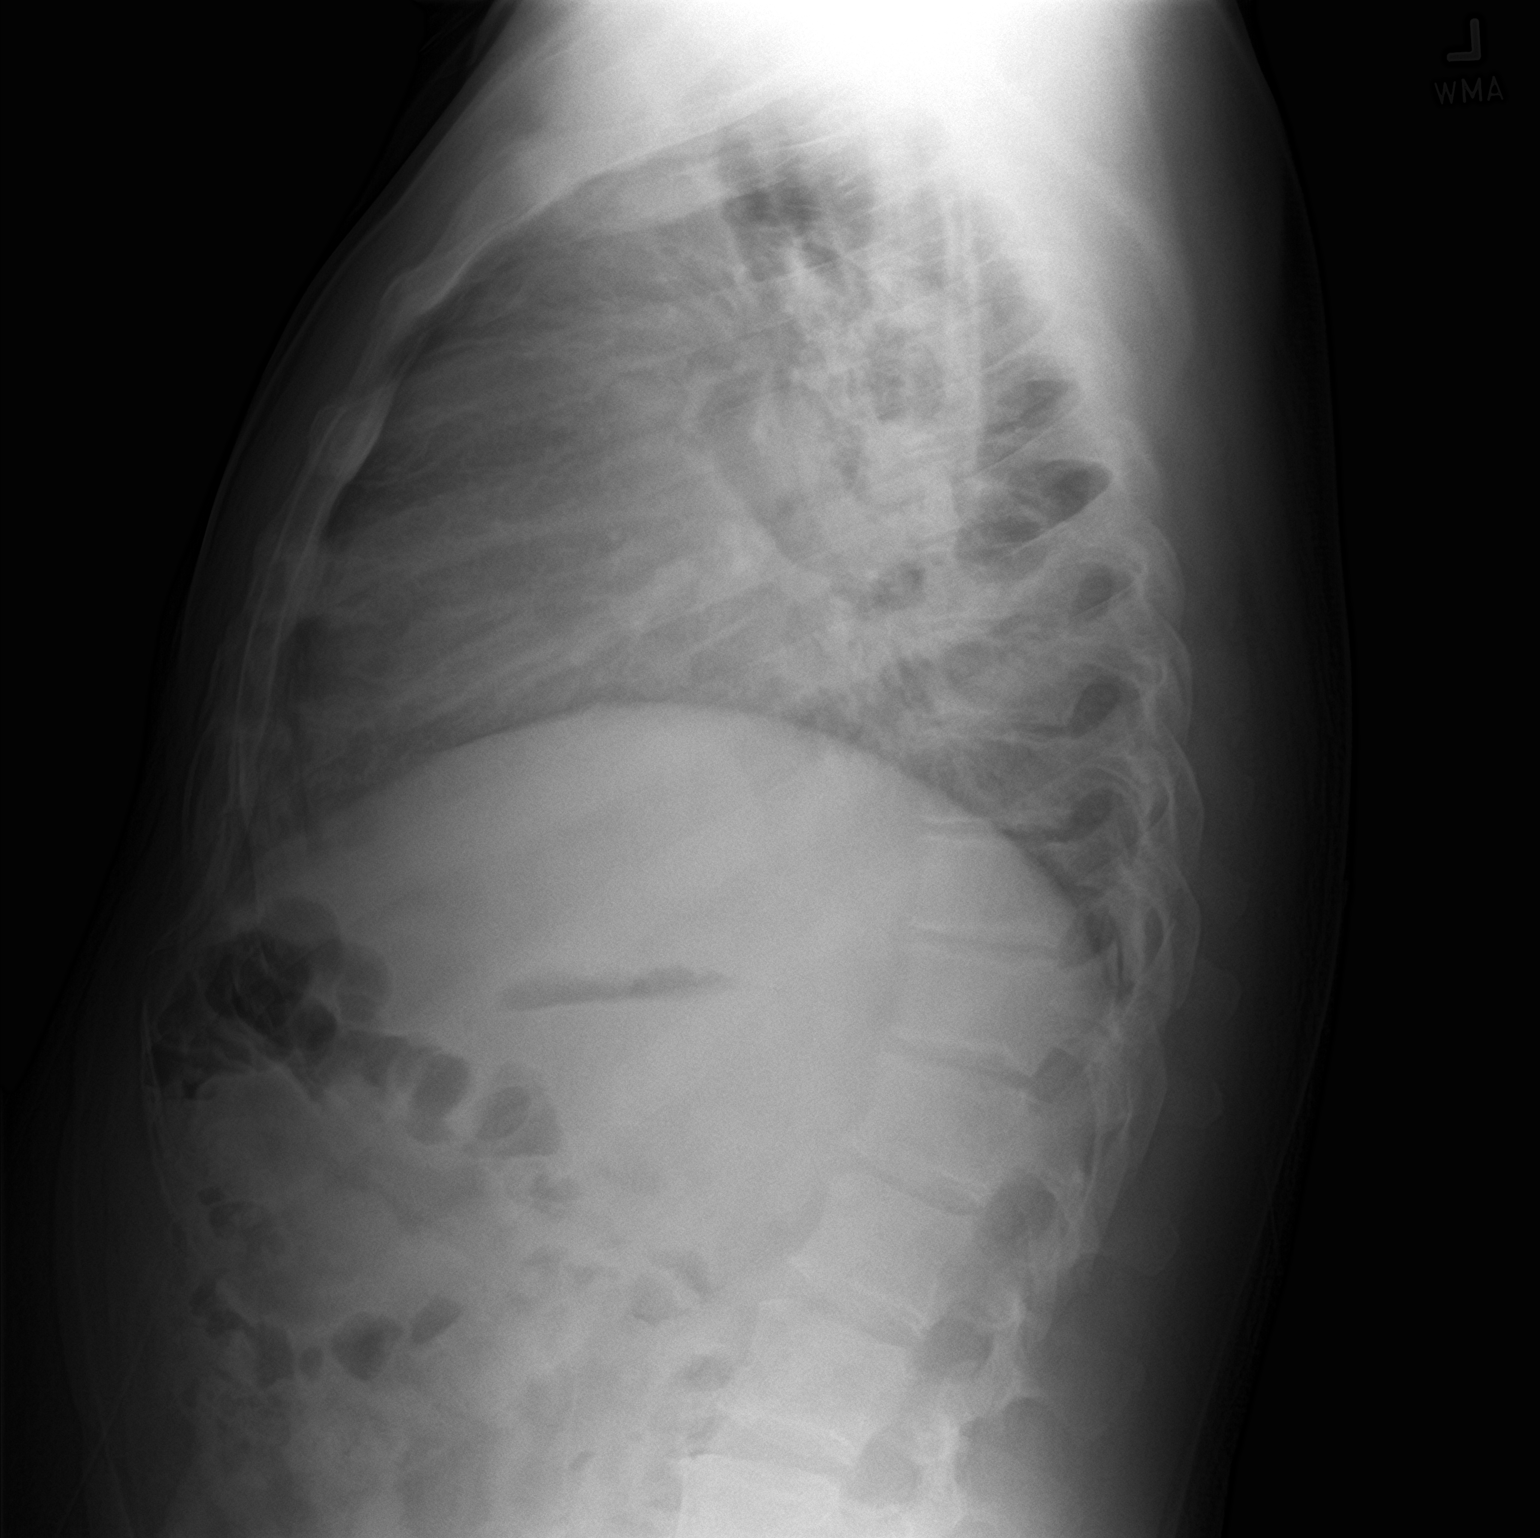

[2 of 2 positions shown; findings below may reference images not displayed]

FINDINGS: Frontal and lateral views of the chest demonstrate an unremarkable
cardiac silhouette. Increased density within the lower [DATE] of the
left hemithorax compatible with consolidation and effusion. Right
chest is clear. No pneumothorax. No acute bony abnormalities.
IMPRESSION: 1. Increased density left hemithorax consistent with left lung
consolidation and effusion. If the patient has clinical signs and
symptoms of pneumonia, Followup PA and lateral chest X-ray is
recommended in 3-4 weeks following trial of antibiotic therapy to
ensure resolution and exclude underlying malignancy. Otherwise, CT
chest with IV contrast may be useful.

These results will be called to the ordering clinician or
representative by the Radiologist Assistant, and communication
documented in the PACS or [REDACTED].

## 2022-04-12 ENCOUNTER — Inpatient Hospital Stay: Payer: BC Managed Care – PPO | Admitting: Hematology & Oncology

## 2022-04-12 ENCOUNTER — Encounter: Payer: Self-pay | Admitting: Hematology & Oncology

## 2022-04-12 ENCOUNTER — Inpatient Hospital Stay: Payer: BC Managed Care – PPO | Attending: Hematology & Oncology

## 2022-04-12 VITALS — BP 122/80 | HR 89 | Temp 97.9°F | Resp 20 | Ht 69.0 in | Wt 214.0 lb

## 2022-04-12 DIAGNOSIS — C8520 Mediastinal (thymic) large B-cell lymphoma, unspecified site: Secondary | ICD-10-CM | POA: Insufficient documentation

## 2022-04-12 DIAGNOSIS — J910 Malignant pleural effusion: Secondary | ICD-10-CM | POA: Insufficient documentation

## 2022-04-12 DIAGNOSIS — C8582 Other specified types of non-Hodgkin lymphoma, intrathoracic lymph nodes: Secondary | ICD-10-CM | POA: Diagnosis not present

## 2022-04-12 DIAGNOSIS — Z79899 Other long term (current) drug therapy: Secondary | ICD-10-CM | POA: Diagnosis not present

## 2022-04-12 LAB — CBC WITH DIFFERENTIAL (CANCER CENTER ONLY)
Abs Immature Granulocytes: 0.01 10*3/uL (ref 0.00–0.07)
Basophils Absolute: 0 10*3/uL (ref 0.0–0.1)
Basophils Relative: 0 %
Eosinophils Absolute: 0.1 10*3/uL (ref 0.0–0.5)
Eosinophils Relative: 3 %
HCT: 41.9 % (ref 39.0–52.0)
Hemoglobin: 14.2 g/dL (ref 13.0–17.0)
Immature Granulocytes: 0 %
Lymphocytes Relative: 27 %
Lymphs Abs: 1.4 10*3/uL (ref 0.7–4.0)
MCH: 31.3 pg (ref 26.0–34.0)
MCHC: 33.9 g/dL (ref 30.0–36.0)
MCV: 92.3 fL (ref 80.0–100.0)
Monocytes Absolute: 0.4 10*3/uL (ref 0.1–1.0)
Monocytes Relative: 8 %
Neutro Abs: 3.2 10*3/uL (ref 1.7–7.7)
Neutrophils Relative %: 62 %
Platelet Count: 237 10*3/uL (ref 150–400)
RBC: 4.54 MIL/uL (ref 4.22–5.81)
RDW: 13.2 % (ref 11.5–15.5)
WBC Count: 5.2 10*3/uL (ref 4.0–10.5)
nRBC: 0 % (ref 0.0–0.2)

## 2022-04-12 LAB — CMP (CANCER CENTER ONLY)
ALT: 16 U/L (ref 0–44)
AST: 17 U/L (ref 15–41)
Albumin: 4.5 g/dL (ref 3.5–5.0)
Alkaline Phosphatase: 68 U/L (ref 38–126)
Anion gap: 6 (ref 5–15)
BUN: 21 mg/dL — ABNORMAL HIGH (ref 6–20)
CO2: 28 mmol/L (ref 22–32)
Calcium: 9.8 mg/dL (ref 8.9–10.3)
Chloride: 106 mmol/L (ref 98–111)
Creatinine: 1.28 mg/dL — ABNORMAL HIGH (ref 0.61–1.24)
GFR, Estimated: >60 mL/min (ref >60)
Glucose, Bld: 89 mg/dL (ref 70–99)
Potassium: 4.3 mmol/L (ref 3.5–5.1)
Sodium: 140 mmol/L (ref 135–145)
Total Bilirubin: 0.7 mg/dL (ref 0.3–1.2)
Total Protein: 6.9 g/dL (ref 6.5–8.1)

## 2022-04-12 LAB — LACTATE DEHYDROGENASE: LDH: 142 U/L (ref 98–192)

## 2022-04-12 NOTE — Progress Notes (Signed)
Hematology and Oncology Follow Up Visit  Travis Duran [id number] [birth year] 46 y.o. 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 daily --complete therapy on 07/2021  Consolidative radiation therapy to the residual mediastinal mass-to finish radiation therapy on 11/06/2020     Interim History:  Travis Duran is back for follow-up.  As always, he is doing quite well.  He has been very active physically.  He runs a lot.  He does car remodeling.  He also has his real job of Engineer, maintenance (IT) at [employer].  He has had recent PET scan.  The PET scan did not show any evidence of recurrent/residual lymphoma.  He has had no chest pain.  He has had no cough or shortness of breath.  He has had no nausea or vomiting.  There is been no change in bowel or bladder habits.  He has had no bleeding.  He has had no leg swelling.  There has been no fever.  He has had no issues with COVID.  Overall, his performance status is ECOG 0.    Medications:  Current Outpatient Medications:    Melatonin 3 MG CAPS, Take 3 mg by mouth at bedtime., Disp: , Rfl:    omeprazole (PRILOSEC) 20 MG capsule, Take 1 capsule (20 mg total) by mouth daily. (Patient taking differently: Take 20 mg by mouth daily as needed.), Disp: 30 capsule, Rfl: 2   tadalafil (CIALIS) 10 MG tablet, Take 1 tablet (10 mg total) by mouth daily as needed for erectile dysfunction., Disp: 10 tablet, Rfl: 11  Allergies: No Known Allergies  Past Medical History, Surgical history, Social history, and Family History were reviewed and updated.  Review of Systems: Review of Systems  Constitutional:  Negative for unexpected weight change.  HENT:   Negative for mouth sores.   Eyes: Negative.   Respiratory:  Positive for chest tightness, cough and shortness of breath.   Cardiovascular: Negative.    Gastrointestinal: Negative.   Endocrine: Negative.   Genitourinary: Negative.    Musculoskeletal: Negative.   Skin: Negative.   Neurological: Negative.   Hematological: Negative.   Psychiatric/Behavioral: Negative.      Physical Exam:  height is '5\' 9"'$  (1.753 m) and weight is 214 lb (97.1 kg). His oral temperature is 97.9 F (36.6 C). His blood pressure is 122/80 and his pulse is 89. His respiration is 20 and oxygen saturation is 100%.   Wt Readings from Last 3 Encounters:  04/12/22 214 lb (97.1 kg)  11/09/21 210 lb (95.3 kg)  10/30/21 212 lb (96.2 kg)    Physical Exam Vitals reviewed.  HENT:     Head: Normocephalic and atraumatic.  Eyes:     Pupils: Pupils are equal, round, and reactive to light.  Cardiovascular:     Rate and Rhythm: Regular rhythm. Tachycardia present.     Heart sounds: Normal heart sounds.     Comments: Cardiac exam is regular rate and rhythm.  There are no murmurs, rubs or bruits.   Pulmonary:     Comments: Lungs are clear bilaterally.  He has good air movement bilaterally.  There are no rales, wheezes or rhonchi.    Abdominal:     General: Bowel sounds are normal.     Palpations: Abdomen is soft.  Musculoskeletal:        General: No tenderness or deformity.  Normal range of motion.     Cervical back: Normal range of motion.  Lymphadenopathy:     Cervical: No cervical adenopathy.  Skin:    General: Skin is warm and dry.     Findings: No erythema or rash.  Neurological:     Mental Status: He is alert and oriented to person, place, and time.  Psychiatric:        Behavior: Behavior normal.        Thought Content: Thought content normal.        Judgment: Judgment normal.    Lab Results  Component Value Date   WBC 5.2 04/12/2022   HGB 14.2 04/12/2022   HCT 41.9 04/12/2022   MCV 92.3 04/12/2022   PLT 237 04/12/2022     Chemistry      Component Value Date/Time   NA 139 11/09/2021 0854   NA 141 08/26/2021 0819   K 3.9 11/09/2021 0854   CL  104 11/09/2021 0854   CO2 28 11/09/2021 0854   BUN 17 11/09/2021 0854   BUN 16 08/26/2021 0819   CREATININE 1.18 11/09/2021 0854      Component Value Date/Time   CALCIUM 10.5 (H) 11/09/2021 0854   ALKPHOS 82 11/09/2021 0854   AST 15 11/09/2021 0854   ALT 16 11/09/2021 0854   BILITOT 0.4 11/09/2021 0854      Impression and Plan: Travis Duran is a really nice 46 year old white male.  He presented with a massive mediastinal tumor.  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  This is a B-cell lymphoma.  We treated him with 6 cycles of chemotherapy with infusional R-EPOCH.  He did well with this.  Had a good response.  We then went ahead and gave him some consolidative radiation therapy because he had such a large mediastinal mass.  This happened about a year and a half since she completed treatment.  I think that we can probably do 1 more scan on him.  We will get the scan in 6 months.  I think of all is good with that scan, then we can hold off on any further scans unless there is a clinical reason.  Just happy that he is done so well.  He has a great quality of life.  I am just very impressed with his constitution and his resilience.    Volanda Napoleon, MD 9/11/20238:29 AM

## 2022-05-21 IMAGING — CT NM PET TUM IMG INITIAL (PI) SKULL BASE T - THIGH
1 of 9 series · 1 of 25 positions shown · non-contrast
Comparison: Multiple exams, including CT chest 04/24/2020 and CT
abdomen from 04/26/2020

CLINICAL DATA: Subsequent treatment strategy for large B-cell
lymphoma. The patient received chemotherapy and steroids last week.

EXAM:
NUCLEAR MEDICINE PET SKULL BASE TO THIGH
TECHNIQUE: 10.5 mCi F-18 FDG was injected intravenously. Full-ring PET imaging
was performed from the skull base to thigh after the radiotracer. CT
data was obtained and used for attenuation correction and anatomic
localization.
Fasting blood glucose: 90 mg/dl

[Series 3: ct wb 5.0 b30f · axial · 5.0mm · 0.98mm/px · 1 of 290 slices shown]
[im 290/290  brain]
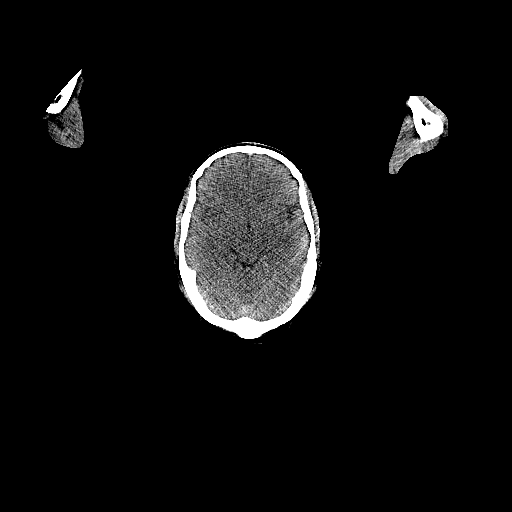

[1 of 25 positions shown; findings below may reference images not displayed]

FINDINGS: Mediastinal blood pool activity: SUV max

Liver activity: SUV max

NECK: 2.1 by 1.5 cm solid-appearing left thyroid nodule is not
hypermetabolic.

Incidental CT findings: none

CHEST: Anterior mediastinal mass measures about 11.8 by 5.6 cm on
image 92 of series 3, previously 14.4 by 10.2 cm on 04/24/2020,
accordingly substantially reduced in volume. Maximum SUV
([HOSPITAL] 4), compatible with active malignancy.

Moderate to large left pleural effusion. Reduced size of the pleural
tumor along the left parietal pleura, with 1 posterolateral deposit
measuring 0.9 cm in short axis on image 138 of series 3 (formerly
1.5 cm) with maximum SUV of 2.7, [HOSPITAL] 2 a separate deposit
adjacent to the descending thoracic aorta has a maximum SUV of 4.1,
[HOSPITAL] 3.

Incidental CT findings: Right Port-A-Cath tip: Right atrium.
Considerable passive atelectasis in the left lung.

ABDOMEN/PELVIS: Diffuse accentuated splenic uptake of FDG, maximum
SUV 5.5. No splenomegaly.

Incidental CT findings: Mild abdominal aortic atherosclerotic
calcification.

SKELETON: Diffuse accentuated marrow activity potentially from
granulocyte stimulation or diffuse marrow involvement by lymphoma.
Index activity in the L5 vertebral body with maximum SUV 6.9.

Incidental CT findings: none
IMPRESSION: 1. Considerable reduction in size of the anterior mediastinal mass
and of the left pleural tumor deposits. The anterior mediastinal
mass is [HOSPITAL] 4 and the pleural deposits measure up to [HOSPITAL]
3 in activity.
2. Diffusely accentuated splenic activity, maximum SUV
([HOSPITAL] 4). This could indicate lymphomatous involvement.
3. Diffusely accentuated marrow activity potentially from
granulocyte stimulation or diffuse marrow involvement by lymphoma.
4. A 2.1 by 1.5 cm solid-appearing left thyroid nodule is not
hypermetabolic. The lack of hypermetabolic activity indicates that
this is highly likely to be benign, although current guidelines fail
to take PET negativity into account. Thus based on current
guidelines, follow up thyroid ultrasound would be recommended.
Recommend thyroid US (ref: [HOSPITAL]. [DATE]):
5. Moderate left pleural effusion with passive atelectasis.
6.  Aortic Atherosclerosis ([id number]).

## 2022-07-08 ENCOUNTER — Encounter: Payer: Self-pay | Admitting: Family Medicine

## 2022-07-08 ENCOUNTER — Ambulatory Visit (INDEPENDENT_AMBULATORY_CARE_PROVIDER_SITE_OTHER): Payer: BC Managed Care – PPO | Admitting: Family Medicine

## 2022-07-08 VITALS — BP 102/76 | HR 86 | Ht 69.0 in | Wt 212.0 lb

## 2022-07-08 DIAGNOSIS — R051 Acute cough: Secondary | ICD-10-CM | POA: Diagnosis not present

## 2022-07-08 LAB — POCT INFLUENZA A/B
Influenza A, POC: NEGATIVE
Influenza B, POC: NEGATIVE

## 2022-07-08 LAB — POC COVID19 BINAXNOW: SARS Coronavirus 2 Ag: NEGATIVE

## 2022-07-08 MED ORDER — PROMETHAZINE-DM 6.25-15 MG/5ML PO SYRP: 5.0000 mL | ORAL_SOLUTION | Freq: Four times a day (QID) | ORAL | 0 refills | Status: DC | PRN

## 2022-07-08 MED ORDER — AZITHROMYCIN 250 MG PO TABS: ORAL_TABLET | ORAL | 0 refills | Status: DC

## 2022-07-08 NOTE — Patient Instructions (Addendum)
-   Start azithromycin, take for full course - Take Mucinex 12 hour twice daily while on the above medication - Use Flonase, 2 sprays each nostril daily while on the above medication - Can use Rx cough medicine as-needed - Contact us for any lingering symptoms or rebound symptoms after completing above medication course - Return for annual physical February 2024

## 2022-07-10 NOTE — Assessment & Plan Note (Signed)
7 day history of persistent cough, non-productive, examination most consistent with rhinosinusitis, COVID and FLU negative. Plan for azithromycin, supportive care.

## 2022-07-10 NOTE — Progress Notes (Signed)
     Primary Care / Sports Medicine Office Visit  Patient Information:  Patient ID: Travis Duran, male DOB: 1976/04/18 Age: 46 y.o. MRN: [id number]   Travis Duran is a pleasant 46 y.o. male presenting with the following:  Chief Complaint  Patient presents with   Cough    X1 week, went to [place] around [place], would like the coughing to stop    Vitals:   07/08/22 1454  BP: 102/76  Pulse: 86  SpO2: 98%   Vitals:   07/08/22 1454  Weight: 212 lb (96.2 kg)  Height: '5\' 9"'$  (1.753 m)   Body mass index is 31.31 kg/m.  No results found.   Independent interpretation of notes and tests performed by another provider:   None  Procedures performed:   None  Pertinent History, Exam, Impression, and Recommendations:   Problem List Items Addressed This Visit       Other   Cough - Primary    7 day history of persistent cough, non-productive, examination most consistent with rhinosinusitis, COVID and FLU negative. Plan for azithromycin, supportive care.       Relevant Orders   POC COVID-19 (Completed)   POCT Influenza A/B (Completed)     Orders & Medications Meds ordered this encounter  Medications   promethazine-dextromethorphan (PROMETHAZINE-DM) 6.25-15 MG/5ML syrup    Sig: Take 5 mLs by mouth 4 (four) times daily as needed for cough.    Dispense:  118 mL    Refill:  0   azithromycin (ZITHROMAX Z-PAK) 250 MG tablet    Sig: Take 2 tablets (500 mg) on  Day 1,  followed by 1 tablet (250 mg) once daily on Days 2 through 5.    Dispense:  6 tablet    Refill:  0   Orders Placed This Encounter  Procedures   POC COVID-19   POCT Influenza A/B     Return in about 2 months (around 09/08/2022) for CPE.     Montel Culver, MD, [facility]   Primary Care Sports Medicine Primary Care and Sports Medicine at [facility]

## 2022-08-24 ENCOUNTER — Encounter: Payer: BC Managed Care – PPO | Admitting: Family Medicine

## 2022-09-06 ENCOUNTER — Encounter: Payer: Self-pay | Admitting: Hematology & Oncology

## 2022-09-09 ENCOUNTER — Encounter: Payer: Self-pay | Admitting: Family Medicine

## 2022-09-09 ENCOUNTER — Ambulatory Visit (INDEPENDENT_AMBULATORY_CARE_PROVIDER_SITE_OTHER): Payer: BC Managed Care – PPO | Admitting: Family Medicine

## 2022-09-09 VITALS — BP 124/76 | HR 72 | Ht 69.0 in | Wt 218.0 lb

## 2022-09-09 DIAGNOSIS — Z1211 Encounter for screening for malignant neoplasm of colon: Secondary | ICD-10-CM

## 2022-09-09 DIAGNOSIS — Z13220 Encounter for screening for lipoid disorders: Secondary | ICD-10-CM

## 2022-09-09 DIAGNOSIS — E559 Vitamin D deficiency, unspecified: Secondary | ICD-10-CM

## 2022-09-09 DIAGNOSIS — I2694 Multiple subsegmental pulmonary emboli without acute cor pulmonale: Secondary | ICD-10-CM

## 2022-09-09 DIAGNOSIS — Z0000 Encounter for general adult medical examination without abnormal findings: Secondary | ICD-10-CM

## 2022-09-09 DIAGNOSIS — N529 Male erectile dysfunction, unspecified: Secondary | ICD-10-CM

## 2022-09-09 DIAGNOSIS — G4733 Obstructive sleep apnea (adult) (pediatric): Secondary | ICD-10-CM

## 2022-09-09 DIAGNOSIS — K2100 Gastro-esophageal reflux disease with esophagitis, without bleeding: Secondary | ICD-10-CM

## 2022-09-09 NOTE — Assessment & Plan Note (Signed)
Describes overall improved symptomatology, still some ongoing sleep-related issues.  Had previous referral to ENT/sleep medicine but cannot tolerate home study, does have a history of comorbid deviated septum.  Discussed treatment strategies and he will consider OTC regimen to see if improved sleep quality noted.  If so, can further evaluate this from an ENT standpoint.

## 2022-09-09 NOTE — Assessment & Plan Note (Signed)
Annual examination completed, risk stratification labs ordered, anticipatory guidance provided.  We will follow labs once resulted. 

## 2022-09-09 NOTE — Progress Notes (Signed)
Annual Physical Exam Visit  Patient Information:  Patient ID: Travis Duran, male DOB: June 23, 1976 Age: 47 y.o. MRN: [id number]   Subjective:   CC: Annual Physical Exam  HPI:  Travis Duran is here for their annual physical.  I reviewed the past medical history, family history, social history, surgical history, and allergies today and changes were made as necessary.  Please see the problem list section below for additional details.  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 (Improved with wt loss)   Pulmonary embolism ([place]) 07/02/2020   Scoliosis    Sleep apnea    Past Surgical History: Past Surgical History:  Procedure Laterality Date   BRONCHIAL WASHINGS  07/03/2020   Procedure: BRONCHIAL WASHINGS;  Surgeon: Spero Geralds, MD;  Location: [facility];  Service: Pulmonary;;   burn to left hand     skin graphs   ESOPHAGOGASTRODUODENOSCOPY (EGD) WITH PROPOFOL N/A 07/17/2021   Procedure: ESOPHAGOGASTRODUODENOSCOPY (EGD) WITH BIOPSY;  Surgeon: Lucilla Lame, MD;  Location: [place];  Service: Endoscopy;  Laterality: N/A;   FINE NEEDLE ASPIRATION  04/25/2020   Procedure: FINE NEEDLE ASPIRATION (FNA) LINEAR;  Surgeon: Candee Furbish, MD;  Location: [place];  Service: Pulmonary;;   IR IMAGING GUIDED PORT INSERTION  05/07/2020   IR REMOVAL TUN ACCESS W/ PORT W/O FL MOD SED  08/18/2021   IR THORACENTESIS ASP PLEURAL SPACE W/IMG GUIDE  04/25/2020   IR THORACENTESIS ASP PLEURAL SPACE W/IMG GUIDE  05/07/2020   REFRACTIVE SURGERY  09/2012    bilateral    THORACENTESIS  04/25/2020   Procedure: THORACENTESIS;  Surgeon: Candee Furbish, MD;  Location: [place];  Service: Pulmonary;;   VIDEO BRONCHOSCOPY N/A 07/03/2020   Procedure: VIDEO BRONCHOSCOPY WITHOUT FLUORO;  Surgeon: Spero Geralds, MD;  Location: [facility];  Service: Pulmonary;  Laterality: N/A;   VIDEO BRONCHOSCOPY WITH ENDOBRONCHIAL ULTRASOUND N/A 04/25/2020   Procedure: VIDEO BRONCHOSCOPY WITH ENDOBRONCHIAL ULTRASOUND;  Surgeon: Candee Furbish, MD;  Location: [facility];  Service: Pulmonary;  Laterality: N/A;   Family History: Family History  Problem Relation Age of Onset   COPD Father    Emphysema Father    Hypertension Father    COPD Sister    Sleep apnea Other    Hypertension Other    Allergies: No Known Allergies Health Maintenance: Health Maintenance  Topic Date Due   COLONOSCOPY (Pts 45-70yr Insurance coverage will need to be confirmed)  Never done   COVID-19 Vaccine (2 - Pfizer risk series) 06/29/2022   DTaP/Tdap/Td (2 - Td or Tdap) 12/15/2025   INFLUENZA VACCINE  Completed   Hepatitis C Screening  Completed   HIV Screening  Completed   HPV VACCINES  Aged Out    HM Colonoscopy          Overdue - COLONOSCOPY (Pts 45-451yrInsurance coverage will need to be confirmed) (Every 10 Years) Overdue - never done    No completion history exists for this topic.  Medications: Current Outpatient Medications on File Prior to Visit  Medication Sig Dispense Refill   Melatonin 3 MG CAPS Take 3 mg by mouth at bedtime.     omeprazole (PRILOSEC) 20 MG capsule Take 1 capsule (20 mg total) by mouth daily. (Patient taking differently: Take 20 mg by mouth daily as needed.) 30 capsule 2   tadalafil (CIALIS) 10 MG tablet Take 1 tablet (10 mg total) by mouth daily as needed for erectile dysfunction. 10 tablet 11   No current facility-administered medications on file prior to visit.    Review of Systems: No headache, visual changes, nausea,  vomiting, diarrhea, constipation, dizziness, abdominal pain, skin rash, fevers, chills, night sweats, swollen lymph nodes, weight loss, chest pain, body aches, joint swelling, muscle aches, shortness of breath, mood changes, visual or auditory hallucinations reported.  Objective:   Vitals:   09/09/22 0810  BP: 124/76  Pulse: 72  SpO2: 98%   Vitals:   09/09/22 0810  Weight: 218 lb (98.9 kg)  Height: '5\' 9"'$  (1.753 m)   Body mass index is 32.19 kg/m.  General: Well Developed, well nourished, and in no acute distress.  Neuro: Alert and oriented x3, extra-ocular muscles intact, sensation grossly intact. Cranial nerves II through XII are grossly intact, motor, sensory, and coordinative functions are intact. HEENT: Normocephalic, atraumatic, pupils equal round reactive to light, neck supple, no masses, no lymphadenopathy, thyroid nonpalpable. Oropharynx, nasopharynx with mildly swollen, nonerythematous turbinates bilaterally, clear secretions noted, external ear canals are unremarkable. Skin: Warm and dry, no rashes noted.  Cardiac: Regular rate and rhythm, no murmurs rubs or gallops. No peripheral edema. Pulses symmetric. Respiratory: Clear to auscultation bilaterally. Not using accessory muscles, speaking in full sentences.  Abdominal: Soft, nontender, nondistended, positive bowel sounds, no masses, no organomegaly. Musculoskeletal: Shoulder, elbow, wrist, hip, knee, ankle stable, and with full range of motion.   Impression and Recommendations:   The patient was counselled, risk factors were discussed, and anticipatory guidance given.  Problem List Items Addressed This Visit       Cardiovascular and Mediastinum   Pulmonary embolism (HCC) (Chronic)     Respiratory   OSA (obstructive sleep apnea)    Describes overall improved symptomatology, still some ongoing sleep-related issues.  Had previous referral to ENT/sleep medicine but cannot tolerate home study, does have a history of  comorbid deviated septum.  Discussed treatment strategies and he will consider OTC regimen to see if improved sleep quality noted.  If so, can further evaluate this from an ENT standpoint.        Digestive   Gastroesophageal reflux disease    Chronic, stable.        Other   Erectile dysfunction    Not reporting any ongoing issues, does have tadalafil for as needed usage, has not required this in some time.      Annual physical exam - Primary    Annual examination completed, risk stratification labs ordered, anticipatory guidance provided.  We will follow labs once resulted.      Relevant Orders   Lipid Panel With LDL/HDL Ratio   Comprehensive Metabolic Panel (CMET)   CBC w/Diff/Platelet   VITAMIN D 25 Hydroxy (Vit-D Deficiency, Fractures)   TSH   Other Visit Diagnoses     Screen for colon cancer       Relevant Orders   Ambulatory referral to Gastroenterology   Vitamin D deficiency       Relevant Orders   VITAMIN D 25 Hydroxy (Vit-D Deficiency, Fractures)  Screening for lipoid disorders       Relevant Orders   Lipid Panel With LDL/HDL Ratio   Comprehensive Metabolic Panel (CMET)        Orders & Medications Medications: No orders of the defined types were placed in this encounter.  Orders Placed This Encounter  Procedures   Lipid Panel With LDL/HDL Ratio   Comprehensive Metabolic Panel (CMET)   CBC w/Diff/Platelet   VITAMIN D 25 Hydroxy (Vit-D Deficiency, Fractures)   TSH   Ambulatory referral to Gastroenterology     Return in about 1 year (around 09/10/2023) for CPE.    Montel Culver, MD, [facility]   Primary Care Sports Medicine Primary Care and Sports Medicine at [facility]

## 2022-09-09 NOTE — Assessment & Plan Note (Signed)
Not reporting any ongoing issues, does have tadalafil for as needed usage, has not required this in some time.

## 2022-09-09 NOTE — Patient Instructions (Addendum)
-   Obtain fasting labs with orders provided (can have water or black coffee but otherwise no food or drink x 8 hours before labs) - Review information provided - Attend eye doctor annually, dentist every 6 months, work towards or maintain 30 minutes of moderate intensity physical activity at least 5 days per week, and consume a balanced diet - Return in 1 year for physical - Contact us for any questions between now and then  Additionally: - Trial combination of Flonase, Zyrtec, and Breathe Right strips nightly x 1 month - If beneficial, can contact us for further optimization / continue to use

## 2022-09-09 NOTE — Assessment & Plan Note (Signed)
Chronic, stable 

## 2022-09-10 ENCOUNTER — Other Ambulatory Visit: Payer: Self-pay | Admitting: Family Medicine

## 2022-09-10 DIAGNOSIS — E559 Vitamin D deficiency, unspecified: Secondary | ICD-10-CM

## 2022-09-10 LAB — CBC WITH DIFFERENTIAL/PLATELET
Basophils Absolute: 0 10*3/uL (ref 0.0–0.2)
Basos: 0 %
EOS (ABSOLUTE): 0.2 10*3/uL (ref 0.0–0.4)
Eos: 3 %
Hematocrit: 45.6 % (ref 37.5–51.0)
Hemoglobin: 14.8 g/dL (ref 13.0–17.7)
Immature Grans (Abs): 0 10*3/uL (ref 0.0–0.1)
Immature Granulocytes: 0 %
Lymphocytes Absolute: 1.6 10*3/uL (ref 0.7–3.1)
Lymphs: 29 %
MCH: 30 pg (ref 26.6–33.0)
MCHC: 32.5 g/dL (ref 31.5–35.7)
MCV: 93 fL (ref 79–97)
Monocytes Absolute: 0.4 10*3/uL (ref 0.1–0.9)
Monocytes: 8 %
Neutrophils Absolute: 3.2 10*3/uL (ref 1.4–7.0)
Neutrophils: 60 %
Platelets: 283 10*3/uL (ref 150–450)
RBC: 4.93 x10E6/uL (ref 4.14–5.80)
RDW: 13.6 % (ref 11.6–15.4)
WBC: 5.4 10*3/uL (ref 3.4–10.8)

## 2022-09-10 LAB — VITAMIN D 25 HYDROXY (VIT D DEFICIENCY, FRACTURES): Vit D, 25-Hydroxy: 22.7 ng/mL — ABNORMAL LOW (ref 30.0–100.0)

## 2022-09-10 LAB — LIPID PANEL WITH LDL/HDL RATIO
Cholesterol, Total: 200 mg/dL — ABNORMAL HIGH (ref 100–199)
HDL: 50 mg/dL (ref >39)
LDL Chol Calc (NIH): 131 mg/dL — ABNORMAL HIGH (ref 0–99)
LDL/HDL Ratio: 2.6 ratio (ref 0.0–3.6)
Triglycerides: 103 mg/dL (ref 0–149)
VLDL Cholesterol Cal: 19 mg/dL (ref 5–40)

## 2022-09-10 LAB — COMPREHENSIVE METABOLIC PANEL
ALT: 19 IU/L (ref 0–44)
AST: 17 IU/L (ref 0–40)
Albumin/Globulin Ratio: 2.1 (ref 1.2–2.2)
Albumin: 4.6 g/dL (ref 4.1–5.1)
Alkaline Phosphatase: 84 IU/L (ref 44–121)
BUN/Creatinine Ratio: 12 (ref 9–20)
BUN: 15 mg/dL (ref 6–24)
Bilirubin Total: 0.5 mg/dL (ref 0.0–1.2)
CO2: 25 mmol/L (ref 20–29)
Calcium: 10 mg/dL (ref 8.7–10.2)
Chloride: 102 mmol/L (ref 96–106)
Creatinine, Ser: 1.29 mg/dL — ABNORMAL HIGH (ref 0.76–1.27)
Globulin, Total: 2.2 g/dL (ref 1.5–4.5)
Glucose: 81 mg/dL (ref 70–99)
Potassium: 4.6 mmol/L (ref 3.5–5.2)
Sodium: 139 mmol/L (ref 134–144)
Total Protein: 6.8 g/dL (ref 6.0–8.5)
eGFR: 69 mL/min/{1.73_m2} (ref >59)

## 2022-09-10 LAB — TSH: TSH: 4.2 u[IU]/mL (ref 0.450–4.500)

## 2022-09-10 MED ORDER — VITAMIN D (ERGOCALCIFEROL) 1.25 MG (50000 UNIT) PO CAPS: 50000.0000 [IU] | ORAL_CAPSULE | ORAL | 0 refills | Status: DC

## 2022-09-22 ENCOUNTER — Other Ambulatory Visit: Payer: Self-pay | Admitting: *Deleted

## 2022-09-22 ENCOUNTER — Telehealth: Payer: Self-pay | Admitting: Gastroenterology

## 2022-09-22 DIAGNOSIS — Z1211 Encounter for screening for malignant neoplasm of colon: Secondary | ICD-10-CM

## 2022-09-22 MED ORDER — NA SULFATE-K SULFATE-MG SULF 17.5-3.13-1.6 GM/177ML PO SOLN: 1.0000 | Freq: Once | ORAL | 0 refills | Status: AC

## 2022-09-22 NOTE — Telephone Encounter (Signed)
Voicemail message left for patient to return my call.

## 2022-09-22 NOTE — Addendum Note (Signed)
Addended by: Jacqualin Combes on: 09/22/2022 04:29 PM   Modules accepted: Orders

## 2022-09-22 NOTE — Telephone Encounter (Signed)
Patient calling to schedule colonoscopy. Requesting call back.

## 2022-09-22 NOTE — Telephone Encounter (Signed)
Gastroenterology Pre-Procedure Review  Request Date: 01/10/2023  Requesting Physician: Dr.Wohl  PATIENT REVIEW QUESTIONS: The patient responded to the following health history questions as indicated:    1. Are you having any GI issues? no 2. Do you have a personal history of Polyps? no 3. Do you have a family history of Colon Cancer or Polyps? no 4. Diabetes Mellitus? no 5. Joint replacements in the past 12 months?no 6. Major health problems in the past 3 months?no 7. Any artificial heart valves, MVP, or defibrillator?no    MEDICATIONS & ALLERGIES:    Patient reports the following regarding taking any anticoagulation/antiplatelet therapy:   Plavix, Coumadin, Eliquis, Xarelto, Lovenox, Pradaxa, Brilinta, or Effient? no Aspirin? no  Patient confirms/reports the following medications:  Current Outpatient Medications  Medication Sig Dispense Refill   Na Sulfate-K Sulfate-Mg Sulf 17.5-3.13-1.6 GM/177ML SOLN Take 1 kit by mouth once for 1 dose. 354 mL 0   Melatonin 3 MG CAPS Take 3 mg by mouth at bedtime.     omeprazole (PRILOSEC) 20 MG capsule Take 1 capsule (20 mg total) by mouth daily. (Patient taking differently: Take 20 mg by mouth daily as needed.) 30 capsule 2   tadalafil (CIALIS) 10 MG tablet Take 1 tablet (10 mg total) by mouth daily as needed for erectile dysfunction. 10 tablet 11   Vitamin D, Ergocalciferol, (DRISDOL) 1.25 MG (50000 UNIT) CAPS capsule Take 1 capsule (50,000 Units total) by mouth every 7 (seven) days. Take for 8 total doses(weeks) 8 capsule 0   No current facility-administered medications for this visit.    Patient confirms/reports the following allergies:  No Known Allergies  No orders of the defined types were placed in this encounter.   AUTHORIZATION INFORMATION Primary Insurance: 1D#: Group #:  Secondary Insurance: 1D#: Group #:  SCHEDULE INFORMATION: Date: 01/10/2023 Time: Location:  [facility]

## 2022-10-06 ENCOUNTER — Ambulatory Visit
Admission: RE | Admit: 2022-10-06 | Discharge: 2022-10-06 | Disposition: A | Discharge status: Home / Self Care | Payer: BC Managed Care – PPO | Source: Ambulatory Visit | Attending: Hematology & Oncology | Admitting: Hematology & Oncology

## 2022-10-06 DIAGNOSIS — C8582 Other specified types of non-Hodgkin lymphoma, intrathoracic lymph nodes: Secondary | ICD-10-CM | POA: Diagnosis not present

## 2022-10-06 LAB — GLUCOSE, CAPILLARY: Glucose-Capillary: 71 mg/dL (ref 70–99)

## 2022-10-06 MED ORDER — FLUDEOXYGLUCOSE F - 18 (FDG) INJECTION
11.3000 | Freq: Once | INTRAVENOUS | Status: AC | PRN
  Administered 2022-10-06: 12.23 via INTRAVENOUS

## 2022-10-07 ENCOUNTER — Telehealth: Payer: Self-pay | Admitting: *Deleted

## 2022-10-07 NOTE — Telephone Encounter (Signed)
As noted below by Dr. Marin Olp, I informed the patient that the PET scan looks fine. There is no evidence of lymphoma. He verbalized understanding.

## 2022-10-07 NOTE — Telephone Encounter (Signed)
-----   Message from Volanda Napoleon, MD sent at 10/07/2022  9:25 AM EST ----- Please call and let him know that the PET scan looks fine.  There is no evidence of recurrent lymphoma.  Thanks.  Laurey Arrow

## 2022-10-11 ENCOUNTER — Other Ambulatory Visit: Payer: Self-pay

## 2022-10-11 ENCOUNTER — Inpatient Hospital Stay: Payer: BC Managed Care – PPO | Attending: Hematology & Oncology

## 2022-10-11 ENCOUNTER — Encounter: Payer: Self-pay | Admitting: Hematology & Oncology

## 2022-10-11 ENCOUNTER — Inpatient Hospital Stay: Payer: BC Managed Care – PPO | Admitting: Hematology & Oncology

## 2022-10-11 VITALS — BP 126/80 | HR 74 | Temp 97.7°F | Resp 18 | Ht 69.0 in | Wt 216.0 lb

## 2022-10-11 DIAGNOSIS — C8330 Diffuse large B-cell lymphoma, unspecified site: Secondary | ICD-10-CM | POA: Diagnosis not present

## 2022-10-11 DIAGNOSIS — Z86711 Personal history of pulmonary embolism: Secondary | ICD-10-CM | POA: Diagnosis not present

## 2022-10-11 DIAGNOSIS — C8520 Mediastinal (thymic) large B-cell lymphoma, unspecified site: Secondary | ICD-10-CM | POA: Diagnosis not present

## 2022-10-11 DIAGNOSIS — E0590 Thyrotoxicosis, unspecified without thyrotoxic crisis or storm: Secondary | ICD-10-CM | POA: Diagnosis not present

## 2022-10-11 DIAGNOSIS — C8582 Other specified types of non-Hodgkin lymphoma, intrathoracic lymph nodes: Secondary | ICD-10-CM

## 2022-10-11 LAB — CMP (CANCER CENTER ONLY)
ALT: 21 U/L (ref 0–44)
AST: 20 U/L (ref 15–41)
Albumin: 4.8 g/dL (ref 3.5–5.0)
Alkaline Phosphatase: 65 U/L (ref 38–126)
Anion gap: 9 (ref 5–15)
BUN: 21 mg/dL — ABNORMAL HIGH (ref 6–20)
CO2: 27 mmol/L (ref 22–32)
Calcium: 10 mg/dL (ref 8.9–10.3)
Chloride: 106 mmol/L (ref 98–111)
Creatinine: 1.41 mg/dL — ABNORMAL HIGH (ref 0.61–1.24)
GFR, Estimated: >60 mL/min (ref >60)
Glucose, Bld: 90 mg/dL (ref 70–99)
Potassium: 3.9 mmol/L (ref 3.5–5.1)
Sodium: 142 mmol/L (ref 135–145)
Total Bilirubin: 0.7 mg/dL (ref 0.3–1.2)
Total Protein: 7.5 g/dL (ref 6.5–8.1)

## 2022-10-11 LAB — CBC WITH DIFFERENTIAL (CANCER CENTER ONLY)
Abs Immature Granulocytes: 0.03 10*3/uL (ref 0.00–0.07)
Basophils Absolute: 0 10*3/uL (ref 0.0–0.1)
Basophils Relative: 0 %
Eosinophils Absolute: 0.1 10*3/uL (ref 0.0–0.5)
Eosinophils Relative: 2 %
HCT: 43.7 % (ref 39.0–52.0)
Hemoglobin: 14.8 g/dL (ref 13.0–17.0)
Immature Granulocytes: 1 %
Lymphocytes Relative: 25 %
Lymphs Abs: 1.4 10*3/uL (ref 0.7–4.0)
MCH: 31.2 pg (ref 26.0–34.0)
MCHC: 33.9 g/dL (ref 30.0–36.0)
MCV: 92 fL (ref 80.0–100.0)
Monocytes Absolute: 0.4 10*3/uL (ref 0.1–1.0)
Monocytes Relative: 8 %
Neutro Abs: 3.5 10*3/uL (ref 1.7–7.7)
Neutrophils Relative %: 64 %
Platelet Count: 268 10*3/uL (ref 150–400)
RBC: 4.75 MIL/uL (ref 4.22–5.81)
RDW: 13.5 % (ref 11.5–15.5)
WBC Count: 5.5 10*3/uL (ref 4.0–10.5)
nRBC: 0 % (ref 0.0–0.2)

## 2022-10-11 LAB — LACTATE DEHYDROGENASE: LDH: 152 U/L (ref 98–192)

## 2022-10-11 LAB — TSH: TSH: 4.515 u[IU]/mL — ABNORMAL HIGH (ref 0.350–4.500)

## 2022-10-11 NOTE — Progress Notes (Signed)
Hematology and Oncology Follow Up Visit  AXSEL MIKLE EX:[id number] April 28, 1976 47 y.o. 10/11/2022   Principle Diagnosis:  Mediastinal Large B-cell NHL -- (+) pleural effusion Pulmonary embolism-bilateral  Current Therapy:   S/p cycle #6 of R-EPOCH -- completed in 08/2020 Xarelto 10 mg p.o. daily --complete therapy on 07/2021  Consolidative radiation therapy to the residual mediastinal mass-to finish radiation therapy on 11/06/2020     Interim History:  Mr. Whitehorn is back for follow-up.  He is doing quite well.  We last saw him 6 months ago.  We did do a PET scan on him.  The PET scan was done on 10/06/2022.  The PET scan did not show any obvious recurrent lymphoma.  He has been quite busy.  He is busy at work.  He is busy running.  He and his wife will be going out to the [employer] for a race in a couple weeks.  He has had no problems with cough or shortness of breath.  He has had no nausea or vomiting.  He has had no bleeding.  Is been no fever.  He has avoided COVID.  Has been no leg swelling.  He has had no tingling in the hands or feet.  Overall, I was his performance status is probably ECOG 0.   Medications:  Current Outpatient Medications:    Melatonin 3 MG CAPS, Take 3 mg by mouth at bedtime., Disp: , Rfl:    omeprazole (PRILOSEC) 20 MG capsule, Take 1 capsule (20 mg total) by mouth daily. (Patient taking differently: Take 20 mg by mouth daily as needed.), Disp: 30 capsule, Rfl: 2   tadalafil (CIALIS) 10 MG tablet, Take 1 tablet (10 mg total) by mouth daily as needed for erectile dysfunction., Disp: 10 tablet, Rfl: 11   Vitamin D, Ergocalciferol, (DRISDOL) 1.25 MG (50000 UNIT) CAPS capsule, Take 1 capsule (50,000 Units total) by mouth every 7 (seven) days. Take for 8 total doses(weeks), Disp: 8 capsule, Rfl: 0  Allergies: No Known Allergies  Past Medical History, Surgical history, Social history, and Family History were reviewed and updated.  Review of Systems: Review of  Systems  Constitutional:  Negative for unexpected weight change.  HENT:   Negative for mouth sores.   Eyes: Negative.   Respiratory:  Positive for chest tightness, cough and shortness of breath.   Cardiovascular: Negative.   Gastrointestinal: Negative.   Endocrine: Negative.   Genitourinary: Negative.    Musculoskeletal: Negative.   Skin: Negative.   Neurological: Negative.   Hematological: Negative.   Psychiatric/Behavioral: Negative.      Physical Exam:  height is '5\' 9"'$  (1.753 m) and weight is 216 lb (98 kg). His oral temperature is 97.7 F (36.5 C). His blood pressure is 126/80 and his pulse is 74. His respiration is 18 and oxygen saturation is 100%.   Wt Readings from Last 3 Encounters:  10/11/22 216 lb (98 kg)  09/09/22 218 lb (98.9 kg)  07/08/22 212 lb (96.2 kg)    Physical Exam Vitals reviewed.  HENT:     Head: Normocephalic and atraumatic.  Eyes:     Pupils: Pupils are equal, round, and reactive to light.  Cardiovascular:     Rate and Rhythm: Regular rhythm. Tachycardia present.     Heart sounds: Normal heart sounds.     Comments: Cardiac exam is regular rate and rhythm.  There are no murmurs, rubs or bruits.   Pulmonary:     Comments: Lungs are clear bilaterally.  He  has good air movement bilaterally.  There are no rales, wheezes or rhonchi.    Abdominal:     General: Bowel sounds are normal.     Palpations: Abdomen is soft.  Musculoskeletal:        General: No tenderness or deformity. Normal range of motion.     Cervical back: Normal range of motion.  Lymphadenopathy:     Cervical: No cervical adenopathy.  Skin:    General: Skin is warm and dry.     Findings: No erythema or rash.  Neurological:     Mental Status: He is alert and oriented to person, place, and time.  Psychiatric:        Behavior: Behavior normal.        Thought Content: Thought content normal.        Judgment: Judgment normal.    Lab Results  Component Value Date   WBC 5.5  10/11/2022   HGB 14.8 10/11/2022   HCT 43.7 10/11/2022   MCV 92.0 10/11/2022   PLT 268 10/11/2022     Chemistry      Component Value Date/Time   NA 139 09/09/2022 0906   K 4.6 09/09/2022 0906   CL 102 09/09/2022 0906   CO2 25 09/09/2022 0906   BUN 15 09/09/2022 0906   CREATININE 1.29 (H) 09/09/2022 0906   CREATININE 1.28 (H) 04/12/2022 0810      Component Value Date/Time   CALCIUM 10.0 09/09/2022 0906   ALKPHOS 84 09/09/2022 0906   AST 17 09/09/2022 0906   AST 17 04/12/2022 0810   ALT 19 09/09/2022 0906   ALT 16 04/12/2022 0810   BILITOT 0.5 09/09/2022 0906   BILITOT 0.7 04/12/2022 0810      Impression and Plan: Mr. Lucatero is a really nice 47 year old white male.  He presented with a massive mediastinal tumor.  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  This is a B-cell lymphoma.  We treated him with 6 cycles of chemotherapy with infusional R-EPOCH.  He did well with this.  Had a good response.  We then went ahead and gave him some consolidative radiation therapy because he had such a large mediastinal mass.  It has now been 2 years since she has been in remission.  I think that a another PET scan would not be a bad idea.  I think that PET scan looks fine, then we can probably hold off on further scans unless there is our symptoms.  I am just happy that he has done so well.  He has a great quality of life.  He and his wife are quite active.  We will see him back in 6 months.   Volanda Napoleon, MD 3/11/20249:22 AM

## 2022-10-29 ENCOUNTER — Ambulatory Visit: Payer: BC Managed Care – PPO | Admitting: Radiation Oncology

## 2022-11-01 ENCOUNTER — Ambulatory Visit: Payer: BC Managed Care – PPO | Admitting: Radiation Oncology

## 2022-11-22 ENCOUNTER — Ambulatory Visit
Admission: RE | Admit: 2022-11-22 | Discharge: 2022-11-22 | Disposition: A | Discharge status: Home / Self Care | Payer: BC Managed Care – PPO | Source: Ambulatory Visit | Attending: Radiation Oncology | Admitting: Radiation Oncology

## 2022-11-22 ENCOUNTER — Encounter: Payer: Self-pay | Admitting: Radiation Oncology

## 2022-11-22 VITALS — BP 135/92 | HR 65 | Temp 97.5°F | Resp 16 | Ht 69.0 in | Wt 217.0 lb

## 2022-11-22 DIAGNOSIS — Z6832 Body mass index (BMI) 32.0-32.9, adult: Secondary | ICD-10-CM | POA: Diagnosis not present

## 2022-11-22 DIAGNOSIS — R1310 Dysphagia, unspecified: Secondary | ICD-10-CM | POA: Insufficient documentation

## 2022-11-22 DIAGNOSIS — Z923 Personal history of irradiation: Secondary | ICD-10-CM | POA: Diagnosis not present

## 2022-11-22 DIAGNOSIS — C8332 Diffuse large B-cell lymphoma, intrathoracic lymph nodes: Secondary | ICD-10-CM

## 2022-11-22 NOTE — Progress Notes (Signed)
Radiation Oncology Follow up Note  Name: Travis Duran   Date:   11/22/2022 MRN:  [id number] DOB: January 17, 1976    This 47 y.o. male presents to the clinic today for 2-year follow-up status post involved field radiation therapy to his mediastinum for large B-cell lymphoma presenting with a large mediastinal mass in a patient status post 6 cycles of R EPOCH.Marland Kitchen  REFERRING PROVIDER: Jerrol Banana, MD  HPI: Patient is a 47 year old male now out over 2 years having completed involved field radiation therapy for mediastinal mass and patient status post 6 cycles of R-EPOCH for large B cell non-Hodgkin's lymphoma with a pleural effusion.  He is seen today in routine follow-up he is doing well.  Specifically [place] fever chills night sweats any significant weight loss cough hemoptysis chest tightness or any dysphagia.  He had a PET CT.  Last month which I have reviewed showing no evidence of hypermetabolic activity above or below the diaphragm.  COMPLICATIONS OF TREATMENT: none  FOLLOW UP COMPLIANCE: keeps appointments   PHYSICAL EXAM:  BP (!) 135/92 (BP Location: Left Arm, Patient Position: Sitting, Cuff Size: Normal)   Pulse 65   Temp (!) 97.5 F (36.4 C) (Tympanic)   Resp 16   Ht  (1.753 m)   Wt 217 lb (98.4 kg)   BMI 32.05 kg/m  No evidence of peripheral adenopathy is identified.  Well-developed well-nourished patient in NAD. HEENT reveals PERLA, EOMI, discs not visualized.  Oral cavity is clear. No oral mucosal lesions are identified. Neck is clear without evidence of cervical or supraclavicular adenopathy. Lungs are clear to A&P. Cardiac examination is essentially unremarkable with regular rate and rhythm without murmur rub or thrill. Abdomen is benign with no organomegaly or masses noted. Motor sensory and DTR levels are equal and symmetric in the upper and lower extremities. Cranial nerves II through XII are grossly intact. Proprioception is intact. No peripheral adenopathy or edema is  identified. No motor or sensory levels are noted. Crude visual fields are within normal range.  RADIOLOGY RESULTS: PET scan reviewed compatible with above-stated findings  PLAN: The time patient is out over 2 years for involved field radiation therapy I am going to discontinue follow-up care and turn follow-up care over to medical oncology.  I be happy to reevaluate this patient anytime should that be indicated.  Patient is to call with any concerns at any time.  I would like to take this opportunity to thank you for allowing me to participate in the care of your patient.Carmina Miller, MD

## 2023-01-03 ENCOUNTER — Encounter: Payer: Self-pay | Admitting: Gastroenterology

## 2023-01-03 NOTE — Anesthesia Preprocedure Evaluation (Addendum)
Anesthesia Evaluation  Patient identified by MRN, date of birth, ID band Patient awake    Reviewed: Allergy & Precautions, H&P , NPO status , Patient's Chart, lab work & pertinent test results  Airway Mallampati: II       Dental   Pulmonary neg pulmonary ROS, sleep apnea           Cardiovascular negative cardio ROS   07-03-20 EF 60-65%, mild LVH   Neuro/Psych negative neurological ROS  negative psych ROS   GI/Hepatic negative GI ROS, Neg liver ROS,GERD  ,,Esophagitis    Endo/Other  negative endocrine ROS    Renal/GU negative Renal ROS  negative genitourinary   Musculoskeletal negative musculoskeletal ROS (+) Arthritis ,    Abdominal   Peds negative pediatric ROS (+)  Hematology negative hematology ROS (+)   Anesthesia Other Findings OSA (obstructive sleep apnea)  ED (erectile dysfunction) Arthritis of right knee  Lymphoma, large cell, intrathoracic lymph nodes (HCC) Pulmonary embolism  H/O spina bifida Scoliosis  GERD (gastroesophageal reflux disease) Sleep apnea  Acute respiratory failure with hypoxia History of thoracentesis  History of pleural effusion Encounter for antineoplastic chemotherapy     Reproductive/Obstetrics negative OB ROS                             Anesthesia Physical Anesthesia Plan  ASA: 3  Anesthesia Plan: General   Post-op Pain Management:    Induction: Intravenous  PONV Risk Score and Plan:   Airway Management Planned: Natural Airway and Nasal Cannula  Additional Equipment:   Intra-op Plan:   Post-operative Plan:   Informed Consent: I have reviewed the patients History and Physical, chart, labs and discussed the procedure including the risks, benefits and alternatives for the proposed anesthesia with the patient or authorized representative who has indicated his/her understanding and acceptance.     Dental Advisory Given  Plan  Discussed with: Anesthesiologist, CRNA and Surgeon  Anesthesia Plan Comments: (Patient consented for risks of anesthesia including but not limited to:  - adverse reactions to medications - risk of airway placement if required - damage to eyes, teeth, lips or other oral mucosa - nerve damage due to positioning  - sore throat or hoarseness - Damage to heart, brain, nerves, lungs, other parts of body or loss of life  Patient voiced understanding.)        Anesthesia Quick Evaluation

## 2023-01-10 ENCOUNTER — Ambulatory Visit: Payer: BC Managed Care – PPO | Admitting: Anesthesiology

## 2023-01-10 ENCOUNTER — Ambulatory Visit
Admission: RE | Admit: 2023-01-10 | Discharge: 2023-01-10 | Disposition: A | Discharge status: Home / Self Care | Payer: BC Managed Care – PPO | Attending: Gastroenterology | Admitting: Gastroenterology

## 2023-01-10 ENCOUNTER — Encounter: Payer: Self-pay | Admitting: Gastroenterology

## 2023-01-10 ENCOUNTER — Encounter
Admission: RE | Disposition: A | Discharge status: Home / Self Care | Payer: Self-pay | Source: Home / Self Care | Attending: Gastroenterology

## 2023-01-10 ENCOUNTER — Other Ambulatory Visit: Payer: Self-pay

## 2023-01-10 DIAGNOSIS — K640 First degree hemorrhoids: Secondary | ICD-10-CM | POA: Insufficient documentation

## 2023-01-10 DIAGNOSIS — K635 Polyp of colon: Secondary | ICD-10-CM | POA: Diagnosis not present

## 2023-01-10 DIAGNOSIS — D124 Benign neoplasm of descending colon: Secondary | ICD-10-CM | POA: Insufficient documentation

## 2023-01-10 DIAGNOSIS — Z1211 Encounter for screening for malignant neoplasm of colon: Secondary | ICD-10-CM | POA: Diagnosis not present

## 2023-01-10 HISTORY — PX: COLONOSCOPY WITH PROPOFOL: SHX5780

## 2023-01-10 HISTORY — PX: POLYPECTOMY: SHX5525

## 2023-01-10 SURGERY — COLONOSCOPY WITH PROPOFOL
Anesthesia: General | Site: Rectum

## 2023-01-10 MED ORDER — SODIUM CHLORIDE 0.9 % IV SOLN: INTRAVENOUS | Status: DC

## 2023-01-10 MED ORDER — STERILE WATER FOR IRRIGATION IR SOLN
Status: DC | PRN
  Administered 2023-01-10: 100 mL

## 2023-01-10 MED ORDER — LIDOCAINE HCL (CARDIAC) PF 100 MG/5ML IV SOSY
PREFILLED_SYRINGE | INTRAVENOUS | Status: DC | PRN
  Administered 2023-01-10: 50 mg via INTRAVENOUS

## 2023-01-10 MED ORDER — PROPOFOL 10 MG/ML IV BOLUS
INTRAVENOUS | Status: DC | PRN
  Administered 2023-01-10: 40 mg via INTRAVENOUS
  Administered 2023-01-10: 25 mg via INTRAVENOUS
  Administered 2023-01-10: 90 mg via INTRAVENOUS
  Administered 2023-01-10: 30 mg via INTRAVENOUS
  Administered 2023-01-10: 40 mg via INTRAVENOUS
  Administered 2023-01-10: 25 mg via INTRAVENOUS

## 2023-01-10 MED ORDER — LACTATED RINGERS IV SOLN: INTRAVENOUS | Status: DC

## 2023-01-10 SURGICAL SUPPLY — 21 items
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
## 2023-01-10 NOTE — Transfer of Care (Signed)
Immediate Anesthesia Transfer of Care Note  Patient: Travis Duran  Procedure(s) Performed: COLONOSCOPY WITH BIOPSY (Rectum) POLYPECTOMY (Rectum)  Patient Location: PACU  Anesthesia Type: General  Level of Consciousness: awake, alert  and patient cooperative  Airway and Oxygen Therapy: Patient Spontanous Breathing and Patient connected to supplemental oxygen  Post-op Assessment: Post-op Vital signs reviewed, Patient's Cardiovascular Status Stable, Respiratory Function Stable, Patent Airway and No signs of Nausea or vomiting  Post-op Vital Signs: Reviewed and stable  Complications: No notable events documented.

## 2023-01-10 NOTE — Anesthesia Postprocedure Evaluation (Signed)
Anesthesia Post Note  Patient: Travis Duran  Procedure(s) Performed: COLONOSCOPY WITH BIOPSY (Rectum) POLYPECTOMY (Rectum)  Patient location during evaluation: PACU Anesthesia Type: General Level of consciousness: awake and alert Pain management: pain level controlled Vital Signs Assessment: post-procedure vital signs reviewed and stable Respiratory status: spontaneous breathing, nonlabored ventilation, respiratory function stable and patient connected to nasal cannula oxygen Cardiovascular status: blood pressure returned to baseline and stable Postop Assessment: no apparent nausea or vomiting Anesthetic complications: no   No notable events documented.   Last Vitals:  Vitals:   01/10/23 0837 01/10/23 0838  BP: 118/88   Pulse: 76 73  Resp: 18 14  Temp:    SpO2: 93% 95%    Last Pain:  Vitals:   01/10/23 0837  TempSrc:   PainSc: 0-No pain                 Marisue Humble

## 2023-01-10 NOTE — Op Note (Signed)
[facility] Patient Name: Travis Duran Procedure Date: 01/10/2023 8:01 AM MRN: [id number] Account #: [id number] Date of Birth: 1975-12-17 Admit Type: Outpatient Age: 47 Room: [facility] OR ROOM 01 Gender: Male Note Status: Finalized Instrument Name: [id number] Procedure:             Colonoscopy Indications:           Screening for colorectal malignant neoplasm Providers:             Midge Minium MD, MD Medicines:             Monitored Anesthesia Care, Propofol per Anesthesia Complications:         No immediate complications. Procedure:             Pre-Anesthesia Assessment:                        - Prior to the procedure, a History and Physical was                         performed, and patient medications and allergies were                         reviewed. The patient's tolerance of previous                         anesthesia was also reviewed. The risks and benefits                         of the procedure and the sedation options and risks                         were discussed with the patient. All questions were                         answered, and informed consent was obtained. Prior                         Anticoagulants: The patient has taken no anticoagulant                         or antiplatelet agents. ASA Grade Assessment: II - A                         patient with mild systemic disease. After reviewing                         the risks and benefits, the patient was deemed in                         satisfactory condition to undergo the procedure.                        After obtaining informed consent, the colonoscope was                         passed under direct vision. Throughout the procedure,                         the  patient's blood pressure, pulse, and oxygen                         saturations were monitored continuously. The was                         introduced through the anus and advanced to the the                          cecum, identified by appendiceal orifice and ileocecal                         valve. The colonoscopy was performed without                         difficulty. The patient tolerated the procedure well.                         The quality of the bowel preparation was excellent. Findings:      The perianal and digital rectal examinations were normal.      A 4 mm polyp was found in the descending colon. The polyp was sessile.       The polyp was removed with a cold snare. Resection and retrieval were       complete.      Non-bleeding internal hemorrhoids were found during retroflexion. The       hemorrhoids were Grade I (internal hemorrhoids that do not prolapse). Impression:            - One 4 mm polyp in the descending colon, removed with                         a cold snare. Resected and retrieved.                        - Non-bleeding internal hemorrhoids. Recommendation:        - Discharge patient to home.                        - Resume previous diet.                        - Continue present medications.                        - Await pathology results.                        - If the pathology report reveals adenomatous tissue,                         then repeat the colonoscopy for surveillance in 7                         years otherwise 10 years. Procedure Code(s):     --- Professional ---                        [phone], Colonoscopy, flexible; with removal of  tumor(s), polyp(s), or other lesion(s) by snare                         technique Diagnosis Code(s):     --- Professional ---                        Z12.11, Encounter for screening for malignant neoplasm                         of colon                        D12.4, Benign neoplasm of descending colon CPT copyright 2022 American Medical Association. All rights reserved. The codes documented in this report are preliminary and upon coder review may  be revised to meet current compliance  requirements. Midge Minium MD, MD 01/10/2023 8:24:46 AM This report has been signed electronically. Number of Addenda: 0 Note Initiated On: 01/10/2023 8:01 AM Scope Withdrawal Time: 0 hours 9 minutes 22 seconds  Total Procedure Duration: 0 hours 11 minutes 20 seconds  Estimated Blood Loss:  Estimated blood loss: none.      [facility]

## 2023-01-11 ENCOUNTER — Encounter: Payer: Self-pay | Admitting: Gastroenterology

## 2023-01-20 ENCOUNTER — Telehealth: Payer: Self-pay

## 2023-01-20 NOTE — Telephone Encounter (Signed)
This path was scanned in by someone else and the path is in the system.

## 2023-01-24 NOTE — Telephone Encounter (Signed)
Patient verbalized understanding of results and put in recall for 10 years

## 2023-01-27 ENCOUNTER — Telehealth: Payer: Self-pay

## 2023-01-27 NOTE — Telephone Encounter (Signed)
Patient colonoscopy path results were scanned on 01/18/2023 and not sent to you. Please review

## 2023-01-29 ENCOUNTER — Encounter: Payer: Self-pay | Admitting: Gastroenterology

## 2023-03-17 ENCOUNTER — Encounter: Payer: Self-pay | Admitting: Hematology & Oncology

## 2023-03-29 ENCOUNTER — Ambulatory Visit
Admission: RE | Admit: 2023-03-29 | Discharge: 2023-03-29 | Disposition: A | Discharge status: Home / Self Care | Payer: BC Managed Care – PPO | Source: Ambulatory Visit | Attending: Hematology & Oncology | Admitting: Hematology & Oncology

## 2023-03-29 DIAGNOSIS — C8330 Diffuse large B-cell lymphoma, unspecified site: Secondary | ICD-10-CM | POA: Insufficient documentation

## 2023-03-29 LAB — GLUCOSE, CAPILLARY: Glucose-Capillary: 76 mg/dL (ref 70–99)

## 2023-03-29 MED ORDER — FLUDEOXYGLUCOSE F - 18 (FDG) INJECTION
10.9000 | Freq: Once | INTRAVENOUS | Status: AC | PRN
  Administered 2023-03-29: 10.9 via INTRAVENOUS

## 2023-04-05 ENCOUNTER — Encounter: Payer: Self-pay | Admitting: *Deleted

## 2023-04-12 ENCOUNTER — Ambulatory Visit: Payer: BC Managed Care – PPO | Admitting: Hematology & Oncology

## 2023-04-12 ENCOUNTER — Other Ambulatory Visit: Payer: BC Managed Care – PPO

## 2023-04-18 ENCOUNTER — Inpatient Hospital Stay (HOSPITAL_BASED_OUTPATIENT_CLINIC_OR_DEPARTMENT_OTHER): Payer: BC Managed Care – PPO | Admitting: Hematology & Oncology

## 2023-04-18 ENCOUNTER — Inpatient Hospital Stay: Payer: BC Managed Care – PPO | Attending: Hematology & Oncology

## 2023-04-18 VITALS — BP 117/79 | HR 86 | Temp 97.8°F | Resp 17 | Ht 69.0 in | Wt 215.0 lb

## 2023-04-18 DIAGNOSIS — Z86711 Personal history of pulmonary embolism: Secondary | ICD-10-CM | POA: Diagnosis not present

## 2023-04-18 DIAGNOSIS — C8332 Diffuse large B-cell lymphoma, intrathoracic lymph nodes: Secondary | ICD-10-CM | POA: Diagnosis not present

## 2023-04-18 DIAGNOSIS — Z923 Personal history of irradiation: Secondary | ICD-10-CM | POA: Insufficient documentation

## 2023-04-18 DIAGNOSIS — Z8572 Personal history of non-Hodgkin lymphomas: Secondary | ICD-10-CM | POA: Diagnosis present

## 2023-04-18 DIAGNOSIS — C8330 Diffuse large B-cell lymphoma, unspecified site: Secondary | ICD-10-CM

## 2023-04-18 LAB — CMP (CANCER CENTER ONLY)
ALT: 17 U/L (ref 0–44)
AST: 23 U/L (ref 15–41)
Albumin: 4.5 g/dL (ref 3.5–5.0)
Alkaline Phosphatase: 69 U/L (ref 38–126)
Anion gap: 8 (ref 5–15)
BUN: 20 mg/dL (ref 6–20)
CO2: 26 mmol/L (ref 22–32)
Calcium: 9.4 mg/dL (ref 8.9–10.3)
Chloride: 105 mmol/L (ref 98–111)
Creatinine: 1.29 mg/dL — ABNORMAL HIGH (ref 0.61–1.24)
GFR, Estimated: >60 mL/min (ref >60)
Glucose, Bld: 119 mg/dL — ABNORMAL HIGH (ref 70–99)
Potassium: 4 mmol/L (ref 3.5–5.1)
Sodium: 139 mmol/L (ref 135–145)
Total Bilirubin: 0.4 mg/dL (ref 0.3–1.2)
Total Protein: 6.9 g/dL (ref 6.5–8.1)

## 2023-04-18 LAB — CBC WITH DIFFERENTIAL (CANCER CENTER ONLY)
Abs Immature Granulocytes: 0.03 10*3/uL (ref 0.00–0.07)
Basophils Absolute: 0 10*3/uL (ref 0.0–0.1)
Basophils Relative: 0 %
Eosinophils Absolute: 0.1 10*3/uL (ref 0.0–0.5)
Eosinophils Relative: 2 %
HCT: 43.1 % (ref 39.0–52.0)
Hemoglobin: 14.2 g/dL (ref 13.0–17.0)
Immature Granulocytes: 1 %
Lymphocytes Relative: 23 %
Lymphs Abs: 1.4 10*3/uL (ref 0.7–4.0)
MCH: 30.4 pg (ref 26.0–34.0)
MCHC: 32.9 g/dL (ref 30.0–36.0)
MCV: 92.3 fL (ref 80.0–100.0)
Monocytes Absolute: 0.4 10*3/uL (ref 0.1–1.0)
Monocytes Relative: 7 %
Neutro Abs: 3.9 10*3/uL (ref 1.7–7.7)
Neutrophils Relative %: 67 %
Platelet Count: 202 10*3/uL (ref 150–400)
RBC: 4.67 MIL/uL (ref 4.22–5.81)
RDW: 13.2 % (ref 11.5–15.5)
WBC Count: 5.9 10*3/uL (ref 4.0–10.5)
nRBC: 0 % (ref 0.0–0.2)

## 2023-04-18 LAB — LACTATE DEHYDROGENASE: LDH: 161 U/L (ref 98–192)

## 2023-04-18 NOTE — Progress Notes (Signed)
Hematology and Oncology Follow Up Visit  CYLIS FRISON [id number] Mar 03, 1976 47 y.o. 04/18/2023   Principle Diagnosis:  Mediastinal Large B-cell NHL -- (+) pleural effusion Pulmonary embolism-bilateral  Current Therapy:   S/p cycle #6 of R-EPOCH -- completed in 08/2020 Xarelto 10 mg p.o. daily --complete therapy on 07/2021  Consolidative radiation therapy to the residual mediastinal mass-to finish radiation therapy on 11/06/2020     Interim History:  Mr. Laberge is back for follow-up.  He is doing quite well.  We last saw him 6 months ago.  As always, we did a PET scan on him.  Thankfully, the PET scan did not show any evidence of recurrent lymphoma.  He is feeling well.  He is working without difficulties.  He is quite busy at work.  He is can be going up to [place] for a race.  This probably in October.  He has had no chest pain.  He said no cough.  No shortness of breath.  Has had no change in bowel or bladder habits.  He has had no fever.  He has had no nausea or vomiting.  He has had no rashes.  There is been no leg swelling.  He has had no bleeding.  Overall, I would say that his performance status is ECOG 0.    Medications:  Current Outpatient Medications:    cetirizine (ZYRTEC) 10 MG tablet, Take 10 mg by mouth daily., Disp: , Rfl:    Melatonin 3 MG CAPS, Take 3 mg by mouth at bedtime., Disp: , Rfl:    tadalafil (CIALIS) 10 MG tablet, Take 1 tablet (10 mg total) by mouth daily as needed for erectile dysfunction., Disp: 10 tablet, Rfl: 11  Allergies: No Known Allergies  Past Medical History, Surgical history, Social history, and Family History were reviewed and updated.  Review of Systems: Review of Systems  Constitutional:  Negative for unexpected weight change.  HENT:   Negative for mouth sores.   Eyes: Negative.   Respiratory:  Positive for chest tightness, cough and shortness of breath.   Cardiovascular: Negative.   Gastrointestinal: Negative.   Endocrine:  Negative.   Genitourinary: Negative.    Musculoskeletal: Negative.   Skin: Negative.   Neurological: Negative.   Hematological: Negative.   Psychiatric/Behavioral: Negative.      Physical Exam:  height is 5\' 9"  (1.753 m) and weight is 215 lb (97.5 kg). His oral temperature is 97.8 F (36.6 C). His blood pressure is 117/79 and his pulse is 86. His respiration is 17 and oxygen saturation is 100%.   Wt Readings from Last 3 Encounters:  04/18/23 215 lb (97.5 kg)  01/10/23 211 lb (95.7 kg)  11/22/22 217 lb (98.4 kg)    Physical Exam Vitals reviewed.  HENT:     Head: Normocephalic and atraumatic.  Eyes:     Pupils: Pupils are equal, round, and reactive to light.  Cardiovascular:     Rate and Rhythm: Regular rhythm. Tachycardia present.     Heart sounds: Normal heart sounds.     Comments: Cardiac exam is regular rate and rhythm.  There are no murmurs, rubs or bruits.   Pulmonary:     Comments: Lungs are clear bilaterally.  He has good air movement bilaterally.  There are no rales, wheezes or rhonchi.    Abdominal:     General: Bowel sounds are normal.     Palpations: Abdomen is soft.  Musculoskeletal:        General: No tenderness or  deformity. Normal range of motion.     Cervical back: Normal range of motion.  Lymphadenopathy:     Cervical: No cervical adenopathy.  Skin:    General: Skin is warm and dry.     Findings: No erythema or rash.  Neurological:     Mental Status: He is alert and oriented to person, place, and time.  Psychiatric:        Behavior: Behavior normal.        Thought Content: Thought content normal.        Judgment: Judgment normal.    Lab Results  Component Value Date   WBC 5.9 04/18/2023   HGB 14.2 04/18/2023   HCT 43.1 04/18/2023   MCV 92.3 04/18/2023   PLT 202 04/18/2023     Chemistry      Component Value Date/Time   NA 139 04/18/2023 1113   NA 139 09/09/2022 0906   K 4.0 04/18/2023 1113   CL 105 04/18/2023 1113   CO2 26 04/18/2023  1113   BUN 20 04/18/2023 1113   BUN 15 09/09/2022 0906   CREATININE 1.29 (H) 04/18/2023 1113      Component Value Date/Time   CALCIUM 9.4 04/18/2023 1113   ALKPHOS 69 04/18/2023 1113   AST 23 04/18/2023 1113   ALT 17 04/18/2023 1113   BILITOT 0.4 04/18/2023 1113      Impression and Plan: Mr. Pakkala is a really nice 47 year old white male.  He presented with a massive mediastinal tumor.  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  This is a B-cell lymphoma.  We treated him with 6 cycles of chemotherapy with infusional R-EPOCH.  He did well with this.  Had a good response.  We then went ahead and gave him some consolidative radiation therapy because he had such a large mediastinal mass.  At this point, we can stop doing PET scans.  I would consider doing a PET scan if he starts to have symptoms or if he has any kind of lab work that looks unusual.  I will plan to see him back in 6 months.  I know that he will have a good time up in [place] when he goes on the race.   Josph Macho, MD 9/16/20241:07 PM

## 2023-05-12 ENCOUNTER — Encounter: Payer: Self-pay | Admitting: Family Medicine

## 2023-05-12 ENCOUNTER — Ambulatory Visit
Admission: RE | Admit: 2023-05-12 | Discharge: 2023-05-12 | Disposition: A | Discharge status: Home / Self Care | Payer: BC Managed Care – PPO | Attending: Family Medicine | Admitting: Family Medicine

## 2023-05-12 ENCOUNTER — Ambulatory Visit
Admission: RE | Admit: 2023-05-12 | Discharge: 2023-05-12 | Disposition: A | Discharge status: Home / Self Care | Payer: BC Managed Care – PPO | Source: Ambulatory Visit | Attending: Family Medicine | Admitting: Family Medicine

## 2023-05-12 ENCOUNTER — Ambulatory Visit: Payer: BC Managed Care – PPO | Admitting: Family Medicine

## 2023-05-12 DIAGNOSIS — M545 Low back pain, unspecified: Secondary | ICD-10-CM | POA: Diagnosis present

## 2023-05-12 MED ORDER — METHOCARBAMOL 500 MG PO TABS
500.0000 mg | ORAL_TABLET | Freq: Three times a day (TID) | ORAL | 0 refills | Status: DC | PRN
Start: 2023-05-12 — End: 2023-09-12

## 2023-05-12 MED ORDER — DICLOFENAC SODIUM 50 MG PO TBEC
50.0000 mg | DELAYED_RELEASE_TABLET | Freq: Two times a day (BID) | ORAL | 0 refills | Status: DC | PRN
Start: 2023-05-12 — End: 2023-09-12

## 2023-05-12 NOTE — Patient Instructions (Addendum)
-   Obtain x-rays today - Can dose diclofenac twice daily as-needed for pain - Can use methocarbamol every 8 hours as-needed for muscle tightness - Start home exercises from website below: https://orthoinfo.aaos.org/globalassets/pdfs/spine-conditioning-program.pdf - Contact us at 2-4 weeks to provide a status update and/or if need medical note for half marathon
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
## 2023-09-12 ENCOUNTER — Ambulatory Visit: Payer: 59 | Admitting: Family Medicine

## 2023-09-12 ENCOUNTER — Encounter: Payer: Self-pay | Admitting: Family Medicine

## 2023-09-12 VITALS — BP 120/83 | HR 77 | Ht 69.0 in | Wt 217.4 lb

## 2023-09-12 DIAGNOSIS — N529 Male erectile dysfunction, unspecified: Secondary | ICD-10-CM

## 2023-09-12 DIAGNOSIS — J302 Other seasonal allergic rhinitis: Secondary | ICD-10-CM

## 2023-09-12 DIAGNOSIS — R7309 Other abnormal glucose: Secondary | ICD-10-CM

## 2023-09-12 DIAGNOSIS — Z0000 Encounter for general adult medical examination without abnormal findings: Secondary | ICD-10-CM

## 2023-09-12 DIAGNOSIS — N400 Enlarged prostate without lower urinary tract symptoms: Secondary | ICD-10-CM | POA: Diagnosis not present

## 2023-09-12 DIAGNOSIS — Z136 Encounter for screening for cardiovascular disorders: Secondary | ICD-10-CM | POA: Diagnosis not present

## 2023-09-12 DIAGNOSIS — K2100 Gastro-esophageal reflux disease with esophagitis, without bleeding: Secondary | ICD-10-CM

## 2023-09-12 MED ORDER — TADALAFIL 10 MG PO TABS
10.0000 mg | ORAL_TABLET | Freq: Every day | ORAL | 11 refills | Status: AC | PRN
Start: 2023-09-12 — End: ?

## 2023-09-12 NOTE — Assessment & Plan Note (Signed)
 Annual examination completed, risk stratification labs ordered, anticipatory guidance provided.  We will follow labs once resulted.

## 2023-09-12 NOTE — Assessment & Plan Note (Signed)
 Gastroesophageal Reflux Disease Occasional symptoms with certain foods and drinks, managed with over-the-counter Tums as needed. -Continue current management.
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
## 2023-09-12 NOTE — Assessment & Plan Note (Signed)
 Erectile Dysfunction Stable, using Cialis  as needed. -Refill Cialis  prescription.

## 2023-09-12 NOTE — Patient Instructions (Addendum)
-   Obtain fasting labs with orders provided (can have water  or black coffee but otherwise no food or drink x 8 hours before labs) - Review information provided - Attend eye doctor annually, dentist every 6 months, work towards or maintain 30 minutes of moderate intensity physical activity at least 5 days per week, and consume a balanced diet - Return in 1 year for physical - Contact us  for any questions between now and then   Patient Plan  1. GERD:    - Continue to use Tums as needed for symptoms.  2. Erectile Dysfunction:    - Continue using Cialis  as needed. Prescription will be refilled.  3. Sleep Apnea:    - Use Zyrtec for congestion. Consider Flonase or Nasacort if needed.  4. Low Back Pain:    - Keep doing core exercises. Add stretches and strength exercises for hips and glutes. (See below)  5. General Health:    - Routine blood work will be done.    - COVID booster received in December 2024.    - Next physical in one year.

## 2023-09-12 NOTE — Assessment & Plan Note (Signed)
 Allergic Rhinitis Improved with weight loss, currently managed with Zyrtec for congestion. -Consider adding intranasal steroid (Flonase, Nasacort) as needed for congestion.

## 2023-10-17 ENCOUNTER — Inpatient Hospital Stay: Payer: BC Managed Care – PPO | Attending: Hematology & Oncology | Admitting: Hematology & Oncology

## 2023-10-17 ENCOUNTER — Encounter: Payer: Self-pay | Admitting: Hematology & Oncology

## 2023-10-17 ENCOUNTER — Other Ambulatory Visit: Payer: Self-pay | Admitting: *Deleted

## 2023-10-17 ENCOUNTER — Inpatient Hospital Stay: Payer: BC Managed Care – PPO

## 2023-10-17 ENCOUNTER — Telehealth: Payer: Self-pay | Admitting: *Deleted

## 2023-10-17 VITALS — BP 112/85 | HR 69 | Temp 98.1°F | Resp 20 | Ht 69.0 in | Wt 217.0 lb

## 2023-10-17 DIAGNOSIS — C8582 Other specified types of non-Hodgkin lymphoma, intrathoracic lymph nodes: Secondary | ICD-10-CM | POA: Diagnosis not present

## 2023-10-17 DIAGNOSIS — Z923 Personal history of irradiation: Secondary | ICD-10-CM | POA: Insufficient documentation

## 2023-10-17 DIAGNOSIS — Z8572 Personal history of non-Hodgkin lymphomas: Secondary | ICD-10-CM | POA: Diagnosis present

## 2023-10-17 DIAGNOSIS — Z7901 Long term (current) use of anticoagulants: Secondary | ICD-10-CM | POA: Diagnosis not present

## 2023-10-17 DIAGNOSIS — C8332 Diffuse large B-cell lymphoma, intrathoracic lymph nodes: Secondary | ICD-10-CM

## 2023-10-17 DIAGNOSIS — Z86711 Personal history of pulmonary embolism: Secondary | ICD-10-CM | POA: Diagnosis present

## 2023-10-17 DIAGNOSIS — E039 Hypothyroidism, unspecified: Secondary | ICD-10-CM

## 2023-10-17 DIAGNOSIS — Z9221 Personal history of antineoplastic chemotherapy: Secondary | ICD-10-CM | POA: Insufficient documentation

## 2023-10-17 LAB — CBC WITH DIFFERENTIAL (CANCER CENTER ONLY)
Abs Immature Granulocytes: 0.03 10*3/uL (ref 0.00–0.07)
Basophils Absolute: 0 10*3/uL (ref 0.0–0.1)
Basophils Relative: 0 %
Eosinophils Absolute: 0.1 10*3/uL (ref 0.0–0.5)
Eosinophils Relative: 3 %
HCT: 41.2 % (ref 39.0–52.0)
Hemoglobin: 14 g/dL (ref 13.0–17.0)
Immature Granulocytes: 1 %
Lymphocytes Relative: 28 %
Lymphs Abs: 1.3 10*3/uL (ref 0.7–4.0)
MCH: 31.1 pg (ref 26.0–34.0)
MCHC: 34 g/dL (ref 30.0–36.0)
MCV: 91.6 fL (ref 80.0–100.0)
Monocytes Absolute: 0.4 10*3/uL (ref 0.1–1.0)
Monocytes Relative: 7 %
Neutro Abs: 2.9 10*3/uL (ref 1.7–7.7)
Neutrophils Relative %: 61 %
Platelet Count: 254 10*3/uL (ref 150–400)
RBC: 4.5 MIL/uL (ref 4.22–5.81)
RDW: 13 % (ref 11.5–15.5)
WBC Count: 4.8 10*3/uL (ref 4.0–10.5)
nRBC: 0 % (ref 0.0–0.2)

## 2023-10-17 LAB — CMP (CANCER CENTER ONLY)
ALT: 16 U/L (ref 0–44)
AST: 18 U/L (ref 15–41)
Albumin: 4.3 g/dL (ref 3.5–5.0)
Alkaline Phosphatase: 68 U/L (ref 38–126)
Anion gap: 6 (ref 5–15)
BUN: 13 mg/dL (ref 6–20)
CO2: 26 mmol/L (ref 22–32)
Calcium: 9.2 mg/dL (ref 8.9–10.3)
Chloride: 104 mmol/L (ref 98–111)
Creatinine: 1.21 mg/dL (ref 0.61–1.24)
GFR, Estimated: >60 mL/min (ref >60)
Glucose, Bld: 85 mg/dL (ref 70–99)
Potassium: 4.2 mmol/L (ref 3.5–5.1)
Sodium: 136 mmol/L (ref 135–145)
Total Bilirubin: 0.6 mg/dL (ref 0.0–1.2)
Total Protein: 6.7 g/dL (ref 6.5–8.1)

## 2023-10-17 LAB — LACTATE DEHYDROGENASE: LDH: 139 U/L (ref 98–192)

## 2023-10-17 LAB — TSH: TSH: 5.084 u[IU]/mL — ABNORMAL HIGH (ref 0.350–4.500)

## 2023-10-17 MED ORDER — LEVOTHYROXINE SODIUM 25 MCG PO TABS: 25.0000 ug | ORAL_TABLET | Freq: Every day | ORAL | 2 refills | Status: DC

## 2023-10-17 NOTE — Telephone Encounter (Signed)
-----   Message from Josph Macho sent at 10/17/2023  3:17 PM EDT ----- Please call and let him know that the thyroid is slowly losing his function.  We probably need to get him on some Synthroid.  Please: Synthroid 25 mcg p.o. daily.  Please make sure that we recheck a TSH on him in 2 months.

## 2023-10-17 NOTE — Progress Notes (Signed)
 Hematology and Oncology Follow Up Visit  JUSTEN FONDA [id number] 1975-11-10 48 y.o. 10/17/2023   Principle Diagnosis:  Mediastinal Large B-cell NHL -- (+) pleural effusion Pulmonary embolism-bilateral  Current Therapy:   S/p cycle #6 of R-EPOCH -- completed in 08/2020 Xarelto 10 mg p.o. daily --complete therapy on 07/2021  Consolidative radiation therapy to the residual mediastinal mass-to finish radiation therapy on 11/06/2020     Interim History:  Mr. Maring is back for follow-up.  He is doing quite well.  We last saw him 6 months ago.  He looks quite good.  He is getting ready for a mini marathon open [place] this weekend.  He still working.  Is quite busy at work.  He has had no cough or shortness of breath.  He has had no nausea or vomiting.  He has had no change in bowel or bladder habits.  There is been no rashes.  He has had no leg swelling.  He has had no bleeding.  He has had no fever.  There is been no problems with COVID.  His last TSH that we checked a year ago was 4.5.  Overall, I would say that his performance status is ECOG 0.      Medications:  Current Outpatient Medications:    cetirizine (ZYRTEC) 10 MG tablet, Take 10 mg by mouth daily., Disp: , Rfl:    Melatonin 3 MG CAPS, Take 3 mg by mouth at bedtime., Disp: , Rfl:    tadalafil (CIALIS) 10 MG tablet, Take 1 tablet (10 mg total) by mouth daily as needed for erectile dysfunction., Disp: 10 tablet, Rfl: 11  Allergies: No Known Allergies  Past Medical History, Surgical history, Social history, and Family History were reviewed and updated.  Review of Systems: Review of Systems  Constitutional:  Negative for unexpected weight change.  HENT:   Negative for mouth sores.   Eyes: Negative.   Respiratory:  Positive for chest tightness, cough and shortness of breath.   Cardiovascular: Negative.   Gastrointestinal: Negative.   Endocrine: Negative.   Genitourinary: Negative.    Musculoskeletal: Negative.    Skin: Negative.   Neurological: Negative.   Hematological: Negative.   Psychiatric/Behavioral: Negative.      Physical Exam:  height is 5\' 9"  (1.753 m) and weight is 217 lb (98.4 kg). His oral temperature is 98.1 F (36.7 C). His blood pressure is 112/85 and his pulse is 69. His respiration is 20 and oxygen saturation is 98%.   Wt Readings from Last 3 Encounters:  10/17/23 217 lb (98.4 kg)  09/12/23 217 lb 6.4 oz (98.6 kg)  05/12/23 212 lb (96.2 kg)    Physical Exam Vitals reviewed.  HENT:     Head: Normocephalic and atraumatic.  Eyes:     Pupils: Pupils are equal, round, and reactive to light.  Cardiovascular:     Rate and Rhythm: Regular rhythm. Tachycardia present.     Heart sounds: Normal heart sounds.     Comments: Cardiac exam is regular rate and rhythm.  There are no murmurs, rubs or bruits.   Pulmonary:     Comments: Lungs are clear bilaterally.  He has good air movement bilaterally.  There are no rales, wheezes or rhonchi.    Abdominal:     General: Bowel sounds are normal.     Palpations: Abdomen is soft.  Musculoskeletal:        General: No tenderness or deformity. Normal range of motion.     Cervical back:  Normal range of motion.  Lymphadenopathy:     Cervical: No cervical adenopathy.  Skin:    General: Skin is warm and dry.     Findings: No erythema or rash.  Neurological:     Mental Status: He is alert and oriented to person, place, and time.  Psychiatric:        Behavior: Behavior normal.        Thought Content: Thought content normal.        Judgment: Judgment normal.    Lab Results  Component Value Date   WBC 4.8 10/17/2023   HGB 14.0 10/17/2023   HCT 41.2 10/17/2023   MCV 91.6 10/17/2023   PLT 254 10/17/2023     Chemistry      Component Value Date/Time   NA 139 04/18/2023 1113   NA 139 09/09/2022 0906   K 4.0 04/18/2023 1113   CL 105 04/18/2023 1113   CO2 26 04/18/2023 1113   BUN 20 04/18/2023 1113   BUN 15 09/09/2022 0906    CREATININE 1.29 (H) 04/18/2023 1113      Component Value Date/Time   CALCIUM 9.4 04/18/2023 1113   ALKPHOS 69 04/18/2023 1113   AST 23 04/18/2023 1113   ALT 17 04/18/2023 1113   BILITOT 0.4 04/18/2023 1113      Impression and Plan: Mr. Kaspar is a really nice 48 year old white male.  He presented with a massive mediastinal tumor.  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  This is a B-cell lymphoma.  We treated him with 6 cycles of chemotherapy with infusional R-EPOCH.  He did well with this.  Had a good response.  We then went ahead and gave him some consolidative radiation therapy because he had such a large mediastinal mass.  I will plan to get him back in another 6 months.  I do believe that he is going be cured of this lymphoma.  He was treated incredibly aggressively.  He is staying active which is certainly good.  I am just happy that he is doing so well.  I know that he will do well in his mini marathon.   Josph Macho, MD 3/17/20258:08 AM

## 2023-10-17 NOTE — Telephone Encounter (Signed)
 As noted below by Dr. Myna Hidalgo, I informed the patient that he needs to start Levothyroxine 25 mcg po daily. He verbalized understanding.

## 2023-11-09 ENCOUNTER — Encounter: Payer: Self-pay | Admitting: Internal Medicine

## 2023-11-09 ENCOUNTER — Ambulatory Visit: Payer: Self-pay

## 2023-11-09 ENCOUNTER — Ambulatory Visit: Admitting: Internal Medicine

## 2023-11-09 VITALS — BP 116/76 | HR 74 | Temp 98.1°F | Ht 69.0 in | Wt 216.5 lb

## 2023-11-09 DIAGNOSIS — U071 COVID-19: Secondary | ICD-10-CM

## 2023-11-09 DIAGNOSIS — E039 Hypothyroidism, unspecified: Secondary | ICD-10-CM | POA: Insufficient documentation

## 2023-11-09 MED ORDER — PROMETHAZINE-DM 6.25-15 MG/5ML PO SYRP
5.0000 mL | ORAL_SOLUTION | Freq: Four times a day (QID) | ORAL | 0 refills | Status: AC | PRN
Start: 2023-11-09 — End: 2023-11-18

## 2023-11-09 MED ORDER — NIRMATRELVIR/RITONAVIR (PAXLOVID)TABLET: 3.0000 | ORAL_TABLET | Freq: Two times a day (BID) | ORAL | 0 refills | Status: AC

## 2023-11-09 NOTE — Telephone Encounter (Signed)
 Wrong clinic

## 2023-11-09 NOTE — Telephone Encounter (Signed)
 Summary: Covid symptoms   Copied From CRM [id number]. Reason for Triage: Tested positive on Covid home test. Symptoms: pressure in head, fatigue, bodyache, runny nose, cough  Callback #: [phone]         Chief Complaint: covid positive this am Symptoms: congestion, cough, body aches Frequency: started yesterday Pertinent Negatives: Patient denies sob, cp Disposition: [] ED /[] Urgent Care (no appt availability in office) / [x] Appointment(In office/virtual)/ []  [facility]/ [] Home Care/ [] Refused Recommended Disposition /[] [place] [facility]/ []  Follow-up with PCP Additional Notes: per protocol apt made for today; care advice given, denies questions; instructed to go to ER if becomes worse.   Reason for Disposition  [1] HIGH RISK patient (e.g., weak immune system, age > 64 years, obesity with BMI 30 or higher, pregnant, chronic lung disease or other chronic medical condition) AND [2] COVID symptoms (e.g., cough, fever)  (Exceptions: Already seen by PCP and no new or worsening symptoms.)  Answer Assessment - Initial Assessment Questions 1. COVID-19 DIAGNOSIS: "How do you know that you have COVID?" (e.g., positive lab test or self-test, diagnosed by doctor or NP/PA, symptoms after exposure).     Tested positive this am at home 2. COVID-19 EXPOSURE: "Was there any known exposure to COVID before the symptoms began?" CDC Definition of close contact: within 6 feet (2 meters) for a total of 15 minutes or more over a 24-hour period.      Congestion, cough, body aches,  3. ONSET: "When did the COVID-19 symptoms start?"      yesterday 4. WORST SYMPTOM: "What is your worst symptom?" (e.g., cough, fever, shortness of breath, muscle aches)     congestion 5. COUGH: "Do you have a cough?" If Yes, ask: "How bad is the cough?"       Denies; states dry 6. FEVER: "Do you have a fever?" If Yes, ask: "What is your temperature, how was it measured, and when did it start?"     na 7.  RESPIRATORY STATUS: "Describe your breathing?" (e.g., normal; shortness of breath, wheezing, unable to speak)      congestion 8. BETTER-SAME-WORSE: "Are you getting better, staying the same or getting worse compared to yesterday?"  If getting worse, ask, "In what way?"     same 9. OTHER SYMPTOMS: "Do you have any other symptoms?"  (e.g., chills, fatigue, headache, loss of smell or taste, muscle pain, sore throat)     Muscle pain 10. HIGH RISK DISEASE: "Do you have any chronic medical problems?" (e.g., asthma, heart or lung disease, weak immune system, obesity, etc.)       PE 3 years ago 11. VACCINE: "Have you had the COVID-19 vaccine?" If Yes, ask: "Which one, how many shots, when did you get it?"       yes 12. PREGNANCY: "Is there any chance you are pregnant?" "When was your last menstrual period?"       na 13. O2 SATURATION MONITOR:  "Do you use an oxygen saturation monitor (pulse oximeter) at home?" If Yes, ask "What is your reading (oxygen level) today?" "What is your usual oxygen saturation reading?" (e.g., 95%)       na  Protocols used: Coronavirus (COVID-19) Diagnosed or Suspected-A-AH

## 2023-11-09 NOTE — Progress Notes (Signed)
 Date:  11/09/2023   Name:  Travis Duran   DOB:  1975-09-03   MRN:  [id number]   Chief Complaint: Covid Positive  Cough This is a new problem. The current episode started yesterday. The problem occurs every few minutes. The cough is Non-productive. Associated symptoms include headaches and a sore throat. Pertinent negatives include no chest pain, chills, fever, shortness of breath or wheezing.  He traveled over the weekend to a family gathering and had onset of symptoms yesterday.  Tested positive at home for Covid this AM.  Review of Systems  Constitutional:  Positive for fatigue. Negative for chills and fever.  HENT:  Positive for sore throat. Negative for trouble swallowing.   Eyes:  Negative for visual disturbance.  Respiratory:  Positive for cough. Negative for chest tightness, shortness of breath and wheezing.   Cardiovascular:  Negative for chest pain.  Gastrointestinal:  Negative for diarrhea, nausea and vomiting.  Neurological:  Positive for headaches. Negative for dizziness.  Hematological:  Negative for adenopathy.  Psychiatric/Behavioral:  Negative for dysphoric mood and sleep disturbance. The patient is not nervous/anxious.      Lab Results  Component Value Date   NA 136 10/17/2023   K 4.2 10/17/2023   CO2 26 10/17/2023   GLUCOSE 85 10/17/2023   BUN 13 10/17/2023   CREATININE 1.21 10/17/2023   CALCIUM 9.2 10/17/2023   EGFR 69 09/09/2022   GFRNONAA >60 10/17/2023   Lab Results  Component Value Date   CHOL 200 (H) 09/09/2022   HDL 50 09/09/2022   LDLCALC 131 (H) 09/09/2022   TRIG 103 09/09/2022   CHOLHDL 4.3 08/26/2021   Lab Results  Component Value Date   TSH 5.084 (H) 10/17/2023   No results found for: "HGBA1C" Lab Results  Component Value Date   WBC 4.8 10/17/2023   HGB 14.0 10/17/2023   HCT 41.2 10/17/2023   MCV 91.6 10/17/2023   PLT 254 10/17/2023   Lab Results  Component Value Date   ALT 16 10/17/2023   AST 18 10/17/2023   ALKPHOS 68  10/17/2023   BILITOT 0.6 10/17/2023   Lab Results  Component Value Date   VD25OH 22.7 (L) 09/09/2022     Patient Active Problem List   Diagnosis Date Noted   Hypothyroidism 11/09/2023   Seasonal allergic rhinitis 09/12/2023   Polyp of descending colon 01/10/2023   Screening for colon cancer 01/10/2023   Gastroesophageal reflux disease 08/24/2021   Acrochordon 08/24/2021   Abnormal gastrointestinal positron emission tomography (PET) scan    Healthcare maintenance 04/09/2021   Lymphoma malignant, 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   Lymphoma, large cell, 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   OSA (obstructive sleep apnea) 04/16/2010   Erectile dysfunction 08/25/2007    No Known Allergies  Past Surgical History:  Procedure Laterality Date   BRONCHIAL WASHINGS  07/03/2020   Procedure: BRONCHIAL WASHINGS;  Surgeon: Charlott Holler, MD;  Location: [facility];  Service: Pulmonary;;   burn to left hand     skin graphs   COLONOSCOPY WITH PROPOFOL N/A 01/10/2023   Procedure: COLONOSCOPY WITH BIOPSY;  Surgeon: Midge Minium, MD;  Location: [facility];  Service: Endoscopy;  Laterality: N/A;   ESOPHAGOGASTRODUODENOSCOPY (EGD) WITH PROPOFOL N/A 07/17/2021   Procedure: ESOPHAGOGASTRODUODENOSCOPY (EGD) WITH BIOPSY;  Surgeon: Midge Minium, MD;  Location: [facility];  Service: Endoscopy;  Laterality: N/A;   FINE NEEDLE ASPIRATION  04/25/2020   Procedure: FINE NEEDLE  ASPIRATION (FNA) LINEAR;  Surgeon: Lorin Glass, MD;  Location: [facility] ENDOSCOPY;  Service: Pulmonary;;   IR IMAGING GUIDED PORT INSERTION  05/07/2020   IR REMOVAL TUN ACCESS W/ PORT W/O FL MOD SED  08/18/2021   IR THORACENTESIS ASP PLEURAL SPACE W/IMG GUIDE  04/25/2020   IR THORACENTESIS ASP PLEURAL SPACE W/IMG GUIDE  05/07/2020   POLYPECTOMY  01/10/2023   Procedure: POLYPECTOMY;  Surgeon: Midge Minium, MD;  Location: [facility];  Service: Endoscopy;;    REFRACTIVE SURGERY  09/2012   bilateral    THORACENTESIS  04/25/2020   Procedure: THORACENTESIS;  Surgeon: Lorin Glass, MD;  Location: [facility] ENDOSCOPY;  Service: Pulmonary;;   VIDEO BRONCHOSCOPY N/A 07/03/2020   Procedure: VIDEO BRONCHOSCOPY WITHOUT FLUORO;  Surgeon: Charlott Holler, MD;  Location: [facility] ENDOSCOPY;  Service: Pulmonary;  Laterality: N/A;   VIDEO BRONCHOSCOPY WITH ENDOBRONCHIAL ULTRASOUND N/A 04/25/2020   Procedure: VIDEO BRONCHOSCOPY WITH ENDOBRONCHIAL ULTRASOUND;  Surgeon: Lorin Glass, MD;  Location: [facility];  Service: Pulmonary;  Laterality: N/A;    Social History   Tobacco Use   Smoking status: Never   Smokeless tobacco: Never  Vaping Use   Vaping status: Never Used  Substance Use Topics   Alcohol use: Yes   Drug use: Never     Medication list has been reviewed and updated.  Current Meds  Medication Sig   cetirizine (ZYRTEC) 10 MG tablet Take 10 mg by mouth daily.   levothyroxine (SYNTHROID) 25 MCG tablet Take 1 tablet (25 mcg total) by mouth daily before breakfast.   Melatonin 3 MG CAPS Take 3 mg by mouth at bedtime.   nirmatrelvir/ritonavir (PAXLOVID) 20 x 150 MG & 10 x 100MG  TABS Take 3 tablets by mouth 2 (two) times daily for 5 days. (Take nirmatrelvir 150 mg two tablets twice daily for 5 days and ritonavir 100 mg one tablet twice daily for 5 days) Patient GFR is > 60   promethazine-dextromethorphan (PROMETHAZINE-DM) 6.25-15 MG/5ML syrup Take 5 mLs by mouth 4 (four) times daily as needed for up to 9 days for cough.   tadalafil (CIALIS) 10 MG tablet Take 1 tablet (10 mg total) by mouth daily as needed for erectile dysfunction.       09/12/2023    8:11 AM 05/12/2023    9:19 AM 09/09/2022    8:14 AM 07/09/2022   11:14 AM  GAD 7 : Generalized Anxiety Score  Nervous, Anxious, on Edge 0 0 0 0  Control/stop worrying 0 0 0 0  Worry too much - different things 0 0 0 0  Trouble relaxing 0 0 0 0  Restless 0 0 0 0  Easily annoyed or irritable 0 0 0 0  Afraid  - awful might happen 0 0 0 0  Total GAD 7 Score 0 0 0 0  Anxiety Difficulty Not difficult at all Not difficult at all Not difficult at all        09/12/2023    8:11 AM 05/12/2023    9:19 AM 09/09/2022    8:14 AM  Depression screen PHQ 2/9  Decreased Interest 0 0 0  Down, Depressed, Hopeless 0 0 0  PHQ - 2 Score 0 0 0  Altered sleeping 0 0 0  Tired, decreased energy 0 0 0  Change in appetite 0 0 0  Feeling bad or failure about yourself  0 0 0  Trouble concentrating 0 0 0  Moving slowly or fidgety/restless 0 0 0  Suicidal thoughts 0 0  0  PHQ-9 Score 0 0 0  Difficult doing work/chores Not difficult at all Not difficult at all Not difficult at all    BP Readings from Last 3 Encounters:  11/09/23 116/76  10/17/23 112/85  09/12/23 120/83    Physical Exam Constitutional:      Appearance: Normal appearance.  HENT:     Right Ear: Tympanic membrane normal.     Left Ear: Tympanic membrane normal.     Nose:     Right Sinus: No maxillary sinus tenderness.     Left Sinus: No maxillary sinus tenderness.     Mouth/Throat:     Pharynx: No oropharyngeal exudate or posterior oropharyngeal erythema.  Cardiovascular:     Rate and Rhythm: Normal rate and regular rhythm.     Heart sounds: No murmur heard. Pulmonary:     Effort: Pulmonary effort is normal.     Breath sounds: No wheezing or rhonchi.  Musculoskeletal:     Cervical back: Normal range of motion.  Lymphadenopathy:     Cervical: No cervical adenopathy.  Neurological:     Mental Status: He is alert.     Wt Readings from Last 3 Encounters:  11/09/23 216 lb 8 oz (98.2 kg)  10/17/23 217 lb (98.4 kg)  09/12/23 217 lb 6.4 oz (98.6 kg)    BP 116/76   Pulse 74   Temp 98.1 F (36.7 C)   Ht 5\' 9"  (1.753 m)   Wt 216 lb 8 oz (98.2 kg)   SpO2 97%   BMI 31.97 kg/m   Assessment and Plan:  Problem List Items Addressed This Visit       Unprioritized   Hypothyroidism   New onset - levothyroxine started by Oncology Lab  Results  Component Value Date   TSH 5.084 (H) 10/17/2023         Other Visit Diagnoses       COVID-19 virus infection    -  Primary   Tylenol every 6-8 hours; push fluids and rest quarantine guidelines provided will Rx paxlovid due to hx of Lymphoma   Relevant Medications   nirmatrelvir/ritonavir (PAXLOVID) 20 x 150 MG & 10 x 100MG  TABS   promethazine-dextromethorphan (PROMETHAZINE-DM) 6.25-15 MG/5ML syrup       No follow-ups on file.    Reubin Milan, MD [facility] Primary [facility] and Sports Medicine [place]

## 2023-11-09 NOTE — Assessment & Plan Note (Signed)
 New onset - levothyroxine started by Oncology Lab Results  Component Value Date   TSH 5.084 (H) 10/17/2023

## 2023-11-09 NOTE — Patient Instructions (Addendum)
 Take Tylenol 650 - 1000 mg every 6-8 hours for fever, body aches and headache. Drink plenty of fluids with electrolytes. Monitor for fever that does not decrease and/or shortness of breath that worsens or is present at rest. Quarantine for 5 days.  [email or web address]

## 2023-12-16 ENCOUNTER — Other Ambulatory Visit: Payer: Self-pay | Admitting: Hematology & Oncology

## 2023-12-16 DIAGNOSIS — E039 Hypothyroidism, unspecified: Secondary | ICD-10-CM

## 2023-12-20 ENCOUNTER — Inpatient Hospital Stay: Attending: Hematology & Oncology

## 2023-12-20 DIAGNOSIS — E039 Hypothyroidism, unspecified: Secondary | ICD-10-CM | POA: Insufficient documentation

## 2023-12-20 LAB — TSH: TSH: 4.22 u[IU]/mL (ref 0.350–4.500)

## 2024-04-18 ENCOUNTER — Inpatient Hospital Stay: Attending: Hematology & Oncology

## 2024-04-18 ENCOUNTER — Ambulatory Visit: Payer: Self-pay | Admitting: Hematology & Oncology

## 2024-04-18 ENCOUNTER — Encounter: Payer: Self-pay | Admitting: Hematology & Oncology

## 2024-04-18 ENCOUNTER — Inpatient Hospital Stay (HOSPITAL_BASED_OUTPATIENT_CLINIC_OR_DEPARTMENT_OTHER): Admitting: Hematology & Oncology

## 2024-04-18 VITALS — BP 103/79 | HR 64 | Temp 97.9°F | Resp 20 | Ht 69.0 in | Wt 214.0 lb

## 2024-04-18 DIAGNOSIS — C8582 Other specified types of non-Hodgkin lymphoma, intrathoracic lymph nodes: Secondary | ICD-10-CM

## 2024-04-18 DIAGNOSIS — Z7901 Long term (current) use of anticoagulants: Secondary | ICD-10-CM | POA: Insufficient documentation

## 2024-04-18 DIAGNOSIS — Z86711 Personal history of pulmonary embolism: Secondary | ICD-10-CM | POA: Diagnosis not present

## 2024-04-18 DIAGNOSIS — E039 Hypothyroidism, unspecified: Secondary | ICD-10-CM | POA: Insufficient documentation

## 2024-04-18 DIAGNOSIS — C851A Unspecified b-cell lymphoma, in remission: Secondary | ICD-10-CM | POA: Insufficient documentation

## 2024-04-18 DIAGNOSIS — Z79890 Hormone replacement therapy (postmenopausal): Secondary | ICD-10-CM | POA: Insufficient documentation

## 2024-04-18 LAB — CBC WITH DIFFERENTIAL (CANCER CENTER ONLY)
Abs Immature Granulocytes: 0.02 K/uL (ref 0.00–0.07)
Basophils Absolute: 0 K/uL (ref 0.0–0.1)
Basophils Relative: 0 %
Eosinophils Absolute: 0.1 K/uL (ref 0.0–0.5)
Eosinophils Relative: 2 %
HCT: 42.7 % (ref 39.0–52.0)
Hemoglobin: 14.2 g/dL (ref 13.0–17.0)
Immature Granulocytes: 0 %
Lymphocytes Relative: 25 %
Lymphs Abs: 1.4 K/uL (ref 0.7–4.0)
MCH: 30.8 pg (ref 26.0–34.0)
MCHC: 33.3 g/dL (ref 30.0–36.0)
MCV: 92.6 fL (ref 80.0–100.0)
Monocytes Absolute: 0.4 K/uL (ref 0.1–1.0)
Monocytes Relative: 7 %
Neutro Abs: 3.7 K/uL (ref 1.7–7.7)
Neutrophils Relative %: 66 %
Platelet Count: 267 K/uL (ref 150–400)
RBC: 4.61 MIL/uL (ref 4.22–5.81)
RDW: 13.4 % (ref 11.5–15.5)
WBC Count: 5.6 K/uL (ref 4.0–10.5)
nRBC: 0 % (ref 0.0–0.2)

## 2024-04-18 LAB — CMP (CANCER CENTER ONLY)
ALT: 17 U/L (ref 0–44)
AST: 21 U/L (ref 15–41)
Albumin: 4.4 g/dL (ref 3.5–5.0)
Alkaline Phosphatase: 74 U/L (ref 38–126)
Anion gap: 9 (ref 5–15)
BUN: 19 mg/dL (ref 6–20)
CO2: 24 mmol/L (ref 22–32)
Calcium: 9.6 mg/dL (ref 8.9–10.3)
Chloride: 107 mmol/L (ref 98–111)
Creatinine: 1.28 mg/dL — ABNORMAL HIGH (ref 0.61–1.24)
GFR, Estimated: >60 mL/min (ref >60)
Glucose, Bld: 89 mg/dL (ref 70–99)
Potassium: 4.5 mmol/L (ref 3.5–5.1)
Sodium: 140 mmol/L (ref 135–145)
Total Bilirubin: 0.5 mg/dL (ref 0.0–1.2)
Total Protein: 7 g/dL (ref 6.5–8.1)

## 2024-04-18 LAB — TSH: TSH: 4.27 u[IU]/mL (ref 0.350–4.500)

## 2024-04-18 LAB — LACTATE DEHYDROGENASE: LDH: 151 U/L (ref 98–192)

## 2024-04-18 NOTE — Progress Notes (Signed)
 Hematology and Oncology Follow Up Visit  LEVERT HESLOP [id number] 1976/06/27 48 y.o. 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 daily --complete therapy on 07/2021  Consolidative radiation therapy to the residual mediastinal mass-to finish radiation therapy on 11/06/2020 Synthroid  25 mcg p.o. daily     Interim History:  Mr. Vuncannon is back for follow-up.  We see him every 6 months.  He is doing quite well.  He is getting ready for the [employer] in October.  I know that he will have a good time.  He is training quite a bit for this.  He is still working quite hard.  He works for [employer].  He does IT for them.  He has had no problems with the Synthroid .  We found that he did have hypothyroidism when he was last here.  He is on low-dose Synthroid .  We will see what his TSH is.  He has had no problems with bowels or bladder.  He has had no rashes.  He has had no leg swelling.  He has had no cough or shortness of breath.  There is been no chest wall pain.  Overall, I would say that his performance status is probably ECOG 0.        Medications:  Current Outpatient Medications:    cetirizine (ZYRTEC) 10 MG tablet, Take 10 mg by mouth daily., Disp: , Rfl:    Melatonin 3 MG CAPS, Take 3 mg by mouth at bedtime., Disp: , Rfl:    SYNTHROID  25 MCG tablet, TAKE 1 TABLET BY MOUTH DAILY BEFORE BREAKFAST., Disp: 90 tablet, Rfl: 1   tadalafil  (CIALIS ) 10 MG tablet, Take 1 tablet (10 mg total) by mouth daily as needed for erectile dysfunction., Disp: 10 tablet, Rfl: 11  Allergies: No Known Allergies  Past Medical History, Surgical history, Social history, and Family History were reviewed and updated.  Review of Systems: Review of Systems  Constitutional:  Negative for unexpected  weight change.  HENT:   Negative for mouth sores.   Eyes: Negative.   Respiratory:  Positive for chest tightness, cough and shortness of breath.   Cardiovascular: Negative.   Gastrointestinal: Negative.   Endocrine: Negative.   Genitourinary: Negative.    Musculoskeletal: Negative.   Skin: Negative.   Neurological: Negative.   Hematological: Negative.   Psychiatric/Behavioral: Negative.      Physical Exam:  height is 5' 9 (1.753 m) and weight is 214 lb (97.1 kg). His oral temperature is 97.9 F (36.6 C). His blood pressure is 103/79 and his pulse is 64. His respiration is 20 and oxygen saturation is 100%.   Wt Readings from Last 3 Encounters:  04/18/24 214 lb (97.1 kg)  11/09/23 216 lb 8 oz (98.2 kg)  10/17/23 217 lb (98.4 kg)    Physical Exam Vitals reviewed.  HENT:     Head: Normocephalic and atraumatic.  Eyes:     Pupils: Pupils are equal, round, and reactive to light.  Cardiovascular:     Rate and Rhythm: Regular rhythm. Tachycardia present.     Heart sounds: Normal heart sounds.     Comments: Cardiac exam is regular rate and rhythm.  There are no murmurs, rubs or bruits.   Pulmonary:     Comments: Lungs are clear bilaterally.  He has good air movement bilaterally.  There are no rales, wheezes or rhonchi.    Abdominal:     General: Bowel sounds are normal.     Palpations: Abdomen is soft.  Musculoskeletal:        General: No tenderness or deformity. Normal range of motion.     Cervical back: Normal range of motion.  Lymphadenopathy:     Cervical: No cervical adenopathy.  Skin:    General: Skin is warm and dry.     Findings: No erythema or rash.  Neurological:     Mental Status: He is alert and oriented to person, place, and time.  Psychiatric:        Behavior: Behavior normal.        Thought Content: Thought content normal.        Judgment: Judgment normal.    Lab Results  Component Value Date   WBC 5.6 04/18/2024   HGB 14.2 04/18/2024   HCT 42.7  04/18/2024   MCV 92.6 04/18/2024   PLT 267 04/18/2024     Chemistry      Component Value Date/Time   NA 136 10/17/2023 0743   NA 139 09/09/2022 0906   K 4.2 10/17/2023 0743   CL 104 10/17/2023 0743   CO2 26 10/17/2023 0743   BUN 13 10/17/2023 0743   BUN 15 09/09/2022 0906   CREATININE 1.21 10/17/2023 0743      Component Value Date/Time   CALCIUM 9.2 10/17/2023 0743   ALKPHOS 68 10/17/2023 0743   AST 18 10/17/2023 0743   ALT 16 10/17/2023 0743   BILITOT 0.6 10/17/2023 0743      Impression and Plan: Mr. Kiraly is a really nice 48 year old white male.  He presented with a massive mediastinal tumor.  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  This is a B-cell lymphoma.  We treated him with 6 cycles of chemotherapy with infusional R-EPOCH.  He did well with this.  Had a good response.  We then went ahead and gave him some consolidative radiation therapy because he had such a large mediastinal mass.  I see no evidence of recurrence.  I do not think we have to do any scans on him.  For right now, we will plan to get him back in 6 more months.  We will follow-up on his TSH.    I know that he will have a wonderful time running the marathon up in [place].  Maude JONELLE Crease, MD 9/17/20258:11 AM

## 2024-05-29 ENCOUNTER — Encounter: Payer: Self-pay | Admitting: Family Medicine

## 2024-05-29 ENCOUNTER — Ambulatory Visit: Admitting: Family Medicine

## 2024-05-29 VITALS — BP 98/66 | HR 79 | Ht 69.0 in | Wt 217.0 lb

## 2024-05-29 DIAGNOSIS — S93411A Sprain of calcaneofibular ligament of right ankle, initial encounter: Secondary | ICD-10-CM | POA: Insufficient documentation

## 2024-05-29 DIAGNOSIS — Z23 Encounter for immunization: Secondary | ICD-10-CM | POA: Insufficient documentation

## 2024-05-29 MED ORDER — COVID-19 MRNA VAC-TRIS(PFIZER) 30 MCG/0.3ML IM SUSY: 0.3000 mL | PREFILLED_SYRINGE | Freq: Once | INTRAMUSCULAR | 0 refills | Status: AC

## 2024-05-29 NOTE — Progress Notes (Signed)
 Primary Care / Sports Medicine Office Visit  Patient Information:  Patient ID: Travis Duran, male DOB: Jul 12, 1976 Age: 48 y.o. MRN: [id number]   Travis Duran is a pleasant 48 y.o. male presenting with the following:  Chief Complaint  Patient presents with   Ankle Injury    Patient ran marathon on 05/27/24. He twisted or rolled his right ankle. He iced ankle and aspirin the night of injury. Swelling has went down but ankle is still tender to touch. Walking and weightbearing are aggravating factors. Something does not feel right.     Vitals:   05/29/24 1322  BP: 98/66  Pulse: 79  SpO2: 99%   Vitals:   05/29/24 1322  Weight: 217 lb (98.4 kg)  Height: 5' 9 (1.753 m)   Body mass index is 32.05 kg/m.  No results found.   Discussed the use of AI scribe software for clinical note transcription with the patient, who gave verbal consent to proceed.   Independent interpretation of notes and tests performed by another provider:   None  Procedures performed:   None  Pertinent History, Exam, Impression, and Recommendations:   Problem List Items Addressed This Visit     Need for immunization against combination of infectious diseases   Influenza vaccination Influenza vaccination advised and agreed upon. - Administer influenza vaccination.  COVID-19 vaccination Requested prescription for COVID-19 vaccination for [facility] administration. - Provide prescription for COVID-19 vaccination.      Sprain of calcaneofibular ligament of right ankle - Primary   History of Present Illness Travis Duran is a 48 year old male who presents with right ankle pain following a marathon injury.  Right ankle pain and swelling - Acute onset of right ankle pain following an awkward landing during the [employer] [place] marathon on May 27, 2024 - Pain began at mile five after attempting to avoid a fallen runner, described as a 'twinge' upon landing, was able to complete the marathon -  Pain is a dull ache localized to the outer lower aspect of the right foot, without radiation - Swelling present post-race, stable since onset - No significant discoloration or bruising of the ankle - Pain is exacerbated by lateral movements, manageable with straight walking - Able to bear weight on the ankle, but increased soreness after periods of rest - No significant changes in swelling or pain since the injury - No use of medications for pain - No fever or systemic symptoms  Physical activity and training - Extensive marathon training, running five days per week - High level of physical activity leading up to the injury  Physical Exam INSPECTION: 1+ swelling without ecchymosis at the lateral malleolus of the right foot. PALPATION: Non-tender at the fibular head, medial malleolus, deltoid, medial midfoot arch along the posterior tibialis tendon distribution, lateral malleolus, ATFL, PTFL, base of the fifth metatarsal, and ankle joint line including the tendons. Focal acute tenderness at the CFL. RANGE OF MOTION: Active dorsiflexion and plantar flexion elicit pain. Active inversion and eversion with tightness.  Assessment and Plan Grade 2 right lateral ankle sprain involving the calcaneofibular ligament Acute grade 2 sprain of the right ankle's calcaneofibular ligament. Treatment requiring stabilization and rehabilitation. Recovery expected in 4-6 weeks. - Provide lace-up ASO brace for stabilization. - Send rehabilitation exercises through MyChart. - Instruct to wear brace for at least one week, gradually discontinue as pain decreases. - Advise starting range of motion exercises by week's end. - Recommend avoiding half marathon this  weekend. - Advise monitoring for persistent pain beyond two weeks, may require imaging.      Other Visit Diagnoses       Encounter for immunization       Relevant Orders   Flu vaccine HIGH DOSE PF(Fluzone Trivalent) (Completed)        Orders &  Medications Medications:  Meds ordered this encounter  Medications   COVID-19 mRNA vaccine, Pfizer, (COMIRNATY) syringe    Sig: Inject 0.3 mLs into the muscle once for 1 dose.    Dispense:  0.3 mL    Refill:  0    Approved at provider discretion. Product selection permitted.   Orders Placed This Encounter  Procedures   Flu vaccine HIGH DOSE PF(Fluzone Trivalent)     No follow-ups on file.     Selinda JINNY Ku, MD, [facility]   Primary Care Sports Medicine Primary Care and Sports Medicine at [facility] [place]

## 2024-05-29 NOTE — Assessment & Plan Note (Signed)
 Influenza vaccination Influenza vaccination advised and agreed upon. - Administer influenza vaccination.  COVID-19 vaccination Requested prescription for COVID-19 vaccination for [facility] administration. - Provide prescription for COVID-19 vaccination.

## 2024-05-29 NOTE — Patient Instructions (Signed)
 VISIT SUMMARY:  Today, you were seen for right ankle pain and swelling following a marathon injury. You were diagnosed with a grade 2 sprain of the right ankle's calcaneofibular ligament. Additionally, you received an influenza vaccination and a prescription for a COVID-19 vaccination.  YOUR PLAN:  RIGHT ANKLE SPRAIN: You have a grade 2 sprain of the right ankle's calcaneofibular ligament, which is an injury that requires stabilization and rehabilitation. -Wear the provided lace-up ASO brace for at least one week and gradually discontinue as pain decreases. -Start range of motion exercises by the end of the week. -Avoid participating in the half marathon this weekend. -Monitor for persistent pain beyond two weeks, as it may require imaging. Contact us  if this is the case.  INFLUENZA VACCINATION: You received an influenza vaccination. -No further action needed.  COVID-19 VACCINATION: You requested a prescription for a COVID-19 vaccination to be [facility] at [facility]. -Use the provided prescription to get your COVID-19 vaccination at [facility].

## 2024-05-29 NOTE — Assessment & Plan Note (Signed)
 History of Present Illness Travis Duran is a 48 year old male who presents with right ankle pain following a marathon injury.  Right ankle pain and swelling - Acute onset of right ankle pain following an awkward landing during the [employer] [place] marathon on May 27, 2024 - Pain began at mile five after attempting to avoid a fallen runner, described as a 'twinge' upon landing, was able to complete the marathon - Pain is a dull ache localized to the outer lower aspect of the right foot, without radiation - Swelling present post-race, stable since onset - No significant discoloration or bruising of the ankle - Pain is exacerbated by lateral movements, manageable with straight walking - Able to bear weight on the ankle, but increased soreness after periods of rest - No significant changes in swelling or pain since the injury - No use of medications for pain - No fever or systemic symptoms  Physical activity and training - Extensive marathon training, running five days per week - High level of physical activity leading up to the injury  Physical Exam INSPECTION: 1+ swelling without ecchymosis at the lateral malleolus of the right foot. PALPATION: Non-tender at the fibular head, medial malleolus, deltoid, medial midfoot arch along the posterior tibialis tendon distribution, lateral malleolus, ATFL, PTFL, base of the fifth metatarsal, and ankle joint line including the tendons. Focal acute tenderness at the CFL. RANGE OF MOTION: Active dorsiflexion and plantar flexion elicit pain. Active inversion and eversion with tightness.  Assessment and Plan Grade 2 right lateral ankle sprain involving the calcaneofibular ligament Acute grade 2 sprain of the right ankle's calcaneofibular ligament. Treatment requiring stabilization and rehabilitation. Recovery expected in 4-6 weeks. - Provide lace-up ASO brace for stabilization. - Send rehabilitation exercises through MyChart. - Instruct to wear  brace for at least one week, gradually discontinue as pain decreases. - Advise starting range of motion exercises by week's end. - Recommend avoiding half marathon this weekend. - Advise monitoring for persistent pain beyond two weeks, may require imaging.

## 2024-07-20 ENCOUNTER — Other Ambulatory Visit: Payer: Self-pay | Admitting: Hematology & Oncology

## 2024-07-20 DIAGNOSIS — E039 Hypothyroidism, unspecified: Secondary | ICD-10-CM

## 2024-09-17 ENCOUNTER — Encounter: Payer: 59 | Admitting: Family Medicine

## 2024-10-16 ENCOUNTER — Ambulatory Visit: Admitting: Hematology & Oncology

## 2024-10-16 ENCOUNTER — Inpatient Hospital Stay
